# Patient Record
Sex: Male | Born: 1950
Health system: Southern US, Community
[De-identification: ages and names within clinical notes are randomized; demographics above are authoritative.]

## PROBLEM LIST (undated history)

## (undated) DIAGNOSIS — N401 Enlarged prostate with lower urinary tract symptoms: Secondary | ICD-10-CM

## (undated) DIAGNOSIS — Z8669 Personal history of other diseases of the nervous system and sense organs: Secondary | ICD-10-CM

## (undated) DIAGNOSIS — J45909 Unspecified asthma, uncomplicated: Secondary | ICD-10-CM

## (undated) DIAGNOSIS — K5730 Diverticulosis of large intestine without perforation or abscess without bleeding: Secondary | ICD-10-CM

## (undated) DIAGNOSIS — Z9889 Other specified postprocedural states: Secondary | ICD-10-CM

## (undated) DIAGNOSIS — Z87442 Personal history of urinary calculi: Secondary | ICD-10-CM

## (undated) DIAGNOSIS — E782 Mixed hyperlipidemia: Secondary | ICD-10-CM

## (undated) DIAGNOSIS — Z973 Presence of spectacles and contact lenses: Secondary | ICD-10-CM

## (undated) DIAGNOSIS — R112 Nausea with vomiting, unspecified: Secondary | ICD-10-CM

## (undated) DIAGNOSIS — T4145XA Adverse effect of unspecified anesthetic, initial encounter: Secondary | ICD-10-CM

## (undated) DIAGNOSIS — F329 Major depressive disorder, single episode, unspecified: Secondary | ICD-10-CM

## (undated) DIAGNOSIS — K219 Gastro-esophageal reflux disease without esophagitis: Secondary | ICD-10-CM

## (undated) DIAGNOSIS — K5909 Other constipation: Secondary | ICD-10-CM

## (undated) DIAGNOSIS — F419 Anxiety disorder, unspecified: Secondary | ICD-10-CM

## (undated) DIAGNOSIS — N201 Calculus of ureter: Secondary | ICD-10-CM

## (undated) DIAGNOSIS — K5790 Diverticulosis of intestine, part unspecified, without perforation or abscess without bleeding: Secondary | ICD-10-CM

## (undated) DIAGNOSIS — I10 Essential (primary) hypertension: Secondary | ICD-10-CM

## (undated) DIAGNOSIS — K5900 Constipation, unspecified: Secondary | ICD-10-CM

## (undated) DIAGNOSIS — N200 Calculus of kidney: Secondary | ICD-10-CM

## (undated) DIAGNOSIS — M1990 Unspecified osteoarthritis, unspecified site: Secondary | ICD-10-CM

## (undated) DIAGNOSIS — G5602 Carpal tunnel syndrome, left upper limb: Secondary | ICD-10-CM

## (undated) DIAGNOSIS — E669 Obesity, unspecified: Secondary | ICD-10-CM

## (undated) DIAGNOSIS — N400 Enlarged prostate without lower urinary tract symptoms: Secondary | ICD-10-CM

## (undated) HISTORY — PX: APPENDECTOMY: SHX54

## (undated) HISTORY — DX: Obesity, unspecified: E66.9

## (undated) HISTORY — PX: COLONOSCOPY: SHX174

## (undated) HISTORY — DX: Essential (primary) hypertension: I10

## (undated) HISTORY — DX: Unspecified osteoarthritis, unspecified site: M19.90

## (undated) HISTORY — DX: Gastro-esophageal reflux disease without esophagitis: K21.9

## (undated) HISTORY — DX: Diverticulosis of intestine, part unspecified, without perforation or abscess without bleeding: K57.90

## (undated) HISTORY — PX: HAMMER TOE SURGERY: SHX385

## (undated) HISTORY — PX: TOTAL KNEE ARTHROPLASTY: SHX125

## (undated) HISTORY — DX: Mixed hyperlipidemia: E78.2

## (undated) HISTORY — PX: INGUINAL HERNIA REPAIR: SUR1180

## (undated) HISTORY — DX: Anxiety disorder, unspecified: F41.9

## (undated) HISTORY — PX: SHOULDER ARTHROSCOPY DISTAL CLAVICLE EXCISION AND OPEN ROTATOR CUFF REPAIR: SHX2396

## (undated) HISTORY — PX: HERNIA REPAIR: SHX51

## (undated) HISTORY — DX: Benign prostatic hyperplasia without lower urinary tract symptoms: N40.0

## (undated) HISTORY — PX: LAPAROSCOPIC SIGMOID COLECTOMY: SHX5928

## (undated) HISTORY — DX: Major depressive disorder, single episode, unspecified: F32.9

---

## 2000-07-04 HISTORY — PX: KNEE ARTHROSCOPY: SUR90

## 2000-07-30 ENCOUNTER — Encounter: Payer: Self-pay | Admitting: Orthopedic Surgery

## 2000-07-30 ENCOUNTER — Inpatient Hospital Stay (HOSPITAL_COMMUNITY): Admission: EM | Admit: 2000-07-30 | Discharge: 2000-08-02 | Payer: Self-pay | Admitting: Emergency Medicine

## 2002-07-04 HISTORY — PX: COLECTOMY: SHX59

## 2003-02-12 ENCOUNTER — Inpatient Hospital Stay (HOSPITAL_COMMUNITY): Admission: RE | Admit: 2003-02-12 | Discharge: 2003-02-17 | Payer: Self-pay | Admitting: *Deleted

## 2003-02-12 ENCOUNTER — Encounter (INDEPENDENT_AMBULATORY_CARE_PROVIDER_SITE_OTHER): Payer: Self-pay | Admitting: Specialist

## 2003-09-17 ENCOUNTER — Ambulatory Visit (HOSPITAL_BASED_OUTPATIENT_CLINIC_OR_DEPARTMENT_OTHER): Admission: RE | Admit: 2003-09-17 | Discharge: 2003-09-17 | Payer: Self-pay | Admitting: *Deleted

## 2003-09-17 ENCOUNTER — Encounter (INDEPENDENT_AMBULATORY_CARE_PROVIDER_SITE_OTHER): Payer: Self-pay | Admitting: *Deleted

## 2003-09-17 ENCOUNTER — Ambulatory Visit (HOSPITAL_COMMUNITY): Admission: RE | Admit: 2003-09-17 | Discharge: 2003-09-17 | Payer: Self-pay | Admitting: *Deleted

## 2006-07-04 HISTORY — PX: SHOULDER SURGERY: SHX246

## 2008-01-17 ENCOUNTER — Ambulatory Visit (HOSPITAL_BASED_OUTPATIENT_CLINIC_OR_DEPARTMENT_OTHER): Admission: RE | Admit: 2008-01-17 | Discharge: 2008-01-17 | Payer: Self-pay | Admitting: Orthopedic Surgery

## 2009-07-04 HISTORY — PX: TOTAL KNEE ARTHROPLASTY: SHX125

## 2009-12-16 ENCOUNTER — Inpatient Hospital Stay (HOSPITAL_COMMUNITY): Admission: RE | Admit: 2009-12-16 | Discharge: 2009-12-18 | Payer: Self-pay | Admitting: Orthopedic Surgery

## 2010-07-10 ENCOUNTER — Encounter: Admission: RE | Admit: 2010-07-10 | Discharge: 2010-07-10 | Payer: Self-pay | Source: Home / Self Care

## 2010-09-19 LAB — BASIC METABOLIC PANEL
BUN: 12 mg/dL (ref 6–23)
CO2: 28 mEq/L (ref 19–32)
Calcium: 8.8 mg/dL (ref 8.4–10.5)
GFR calc Af Amer: >60 mL/min (ref >60)
GFR calc Af Amer: >60 mL/min (ref >60)
GFR calc non Af Amer: >60 mL/min (ref >60)
GFR calc non Af Amer: >60 mL/min (ref >60)
Potassium: 3.9 mEq/L (ref 3.5–5.1)
Sodium: 133 mEq/L — ABNORMAL LOW (ref 135–145)
Sodium: 135 mEq/L (ref 135–145)

## 2010-09-19 LAB — PROTIME-INR
INR: 1.29 (ref 0.00–1.49)
Prothrombin Time: 14 seconds (ref 11.6–15.2)

## 2010-09-19 LAB — CBC
HCT: 34.9 % — ABNORMAL LOW (ref 39.0–52.0)
Hemoglobin: 11.5 g/dL — ABNORMAL LOW (ref 13.0–17.0)
Platelets: 200 10*3/uL (ref 150–400)
RBC: 3.56 MIL/uL — ABNORMAL LOW (ref 4.22–5.81)
RBC: 3.79 MIL/uL — ABNORMAL LOW (ref 4.22–5.81)
WBC: 11 10*3/uL — ABNORMAL HIGH (ref 4.0–10.5)

## 2010-09-20 LAB — URINALYSIS, ROUTINE W REFLEX MICROSCOPIC
Bilirubin Urine: NEGATIVE
Protein, ur: NEGATIVE mg/dL
Specific Gravity, Urine: 1.014 (ref 1.005–1.030)
Urobilinogen, UA: 0.2 mg/dL (ref 0.0–1.0)

## 2010-09-20 LAB — PROTIME-INR
INR: 0.94 (ref 0.00–1.49)
Prothrombin Time: 12.5 seconds (ref 11.6–15.2)

## 2010-09-20 LAB — CBC
HCT: 42.2 % (ref 39.0–52.0)
Hemoglobin: 14.3 g/dL (ref 13.0–17.0)
WBC: 8.3 10*3/uL (ref 4.0–10.5)

## 2010-09-20 LAB — TYPE AND SCREEN: Antibody Screen: NEGATIVE

## 2010-09-20 LAB — COMPREHENSIVE METABOLIC PANEL
Alkaline Phosphatase: 64 U/L (ref 39–117)
BUN: 15 mg/dL (ref 6–23)
Chloride: 105 mEq/L (ref 96–112)
Glucose, Bld: 110 mg/dL — ABNORMAL HIGH (ref 70–99)
Potassium: 4.1 mEq/L (ref 3.5–5.1)
Total Bilirubin: 0.5 mg/dL (ref 0.3–1.2)

## 2010-09-20 LAB — URINE MICROSCOPIC-ADD ON

## 2010-11-16 NOTE — Op Note (Signed)
NAME:  Tony Douglas, Tony Douglas NO.:  [id number]   MEDICAL RECORD NO.:  [id number]          PATIENT TYPE:  AMB   LOCATION:  DSC                          FACILITY:  [facility]   PHYSICIAN:  Loreta Ave, M.D. DATE OF BIRTH:  Apr 10, 1951   DATE OF PROCEDURE:  01/17/2008  DATE OF DISCHARGE:                               OPERATIVE REPORT   PREOPERATIVE DIAGNOSES:  1. Right shoulder impingement.  2. Distal clavicle degenerative joint disease.  3. Extensive tearing supra and infraspinatus tendon with retraction.   POSTOPERATIVE DIAGNOSES:  1. Right shoulder impingement.  2. Distal clavicle degenerative joint disease.  3. Extensive tearing supra and infraspinatus tendon with retraction.  4. Complex labral tearing.   PROCEDURE:  1. Right shoulder exam under anesthesia.  2. Arthroscopy, debridement of labrum and cuff.  3. Acromioplasty with CA ligament release.  4. Excision, distal clavicle.  5. Arthroscopic-assisted rotator cuff repair.  6. Supra and infraspinatus tendon with fiber weave sutures x3 swivel      lock anchors x3.   SURGEON:  Loreta Ave, MD   ASSISTANT:  Genene Churn. Barry Dienes, [place], present throughout the entire case  necessary for timely completion of procedure.   ANESTHESIA:  General.   BLOOD LOSS:  Minimal.   SPECIMENS:  None.   COUNTS:  None.   COMPLICATIONS:  None.   DRESSINGS:  Soft compressive with shoulder immobilizer.   PROCEDURE:  The patient was brought to the operating room and placed on  operating table in supine position.  After adequate anesthesia had been  obtained, shoulder examined.  Full motion and good stability.  Placed in  a beach-chair position on the shoulder positioner, and was prepped and  draped in usual sterile fashion.  Three portals were created; anterior,  posterior, and lateral.  Shoulder entered with blunt obturator.  Arthroscope was introduced, with the shoulder distended and inspected.  Complete tearing supraspinatus  tendon from the biceps in the front all  way into the top half of the infraspinatus and the back with a little  interval tearing between super and infraspinatus of the back.  Debrided  back to healthy tissue.  Although, this was a massive retracted tear, it  was still very mobile and could be brought out laterally fairly easily.  Partial tearing and subscapularis debrided.  Biceps tendon intact.  Complex tearing, labrum debrided.  Grade 2 change in the shoulder.  From  above type 3 acromion debrided and then converted to a type 1 acromion  with shaver high-speed bur releasing a CA ligament off the front to  facilitate this.  Grade 4 changes distal clavicle with marked spurring.  Periarticular spurs and lateral centimeter of clavicle resected with a  shaver and high-speed bur.  Adequacy of decompression confirmed viewing  from all portals.  Lateral portal was opened, little larger and a  cannula was inserted there.  I then captured the cuff with a horizontal  mattress suture through the lateral portal over the supraspinatus tendon  anterior and posterior.  These were firmly anchored down into the  tuberosity which had been  roughened to bleeding bone.  I anchored there  with swivel lock device which gave a nice firm closure and repair.  Slightly medialized.  Once those were in I then captured the back of the  supraspinatus going over the interval tear at the top half of the  infraspinatus with a horizontal mattress sutures well.  This was brought  out laterally and then firmly anchored down into the back of the  tuberosity near the top of the infraspinatus attachment.  That was also  done with a swivel lock device.  All excess sutures were cut.  A  completion and adequacy of repair was confirmed looking from above and  below.  Full passive motion without too much tension on the cuff repair.  All instruments and fluid removed.  Portals injected with Marcaine and  closed with nylon.  Sterile  compressive dressing applied.  Shoulder  mobilizer applied.  Anesthesia reversed.  Brought to the room.  Tolerated surgery well with no complications.      Loreta Ave, M.D.  Electronically Signed     DFM/MEDQ  D:  01/17/2008  T:  01/18/2008  Job:  [id number]

## 2010-11-19 NOTE — Op Note (Signed)
   NAME:  Tony Douglas, Tony Douglas                           ACCOUNT NO.:  [id number]   MEDICAL RECORD NO.:  [id number]                   PATIENT TYPE:  INP   LOCATION:  0366                                 FACILITY:  [facility]   PHYSICIAN:  Vikki Ports, M.D.         DATE OF BIRTH:  [birth year]   DATE OF PROCEDURE:  02/12/2003  DATE OF DISCHARGE:                                 OPERATIVE REPORT   PREOPERATIVE DIAGNOSIS:  Sigmoid diverticulosis.   POSTOPERATIVE DIAGNOSIS:  Sigmoid diverticulosis.   PROCEDURE:  Laparoscopic sigmoid colectomy with mobilization of the splenic  flexure.   SURGEON:  Vikki Ports, M.D.   ASSISTANT:  Sharlet Salina T. Hoxworth, M.D.   ANESTHESIA:  General.   DESCRIPTION OF PROCEDURE:  The patient was taken to the operating room and  placed in the supine position.  After adequate general anesthesia was  induced, the abdomen and perineum were prepped and draped in the normal  sterile fashion.  The patient was placed in the lithotomy position.  Foley  catheter was placed.  A infraumbilical vertical incision was made and Hasson  trocar was placed in the abdomen.  Pneumoperitoneum was obtained with 10 mm  port in the left lower quadrant.  A 5 mm port was placed in the right  abdomen and an additional 5 mm port was in the suprapubic region.  The  sigmoid colon was identified.   Lateral attachments were taken down and following the line of Toldt, the  colon was completely mobilized medially.  It was bluntly dissected off  Gerota's fascia and the splenic flexure was taken down.  This provided  adequate mobilization of the colon.  The diseased portion was dissected  __________ at the rectosigmoid junction at the __________ proximal  __________  fascia there.  The mesentery was buried in fat and I felt that  the shape and __________.   Dictation ended here.                                               Vikki Ports, M.D.    KRH/MEDQ  D:   02/12/2003  T:  02/12/2003  Job:  [id number]

## 2010-11-19 NOTE — Op Note (Signed)
[place]. [facility]  Patient:    Tony Douglas, Tony Douglas                          MRN: [id number] Proc. Date: 07/30/00 Attending:  Elana Alm. Thurston Hole, M.D.                           Operative Report  PREOPERATIVE DIAGNOSIS:   Right septic knee infection.  POSTOPERATIVE DIAGNOSIS:  Right septic knee infection.  OPERATION:  Right knee examination under anesthesia followed by arthroscopic lavage.  SURGEON:  Elana Alm. Thurston Hole, M.D.  ASSISTANT:  Kirstin A. Shepperson, P.A.  ANESTHESIA:  General.  OPERATIVE TIME:  45 minutes.  COMPLICATIONS:  None.  DESCRIPTION OF PROCEDURE:  Mr. Redfield was brought to the operating room on Jul 30, 2000, placed on the table in the supine position.  After an adequate level of general anesthesia was obtained, his right knee was examined under anesthesia.  He had a large effusion with erythema and surrounding cellulitis. Range of motion was from -5 to 125 degrees.  The knee was stable ligamentous exam.  Right leg was prepped using sterile Betadine and draped using sterile technique.  He received Ancef 1 g IV preoperatively.  Initially through the inferior lateral and inferior medial portals, the arthroscope with a pump attached was placed and an arthroscopic probe was placed.  Moderate serous bloody fluid was evacuated from the knee, and this was sent for Gram stain and culture.  On initial inspection, the articular cartilage, medial femoral condyle, medial tibial plateau showed 50-75% grade III chondromalacia which was thoroughly debrided.  The medial meniscus had a remnant remaining of about 30% in the posterior and medial horn which was further debrided.  Moderate purulent material was removed.  Intracondylar notch inspected.  A large amount of synovitis was noted in the anterior compartment of the knee blocking full extension.  This was thoroughly debrided.  Underneath this, the anterior and posterior cruciate ligaments were found to  be intact.  The lateral compartment was inspected.  Articular cartilage, lateral femoral condyle and lateral tibial plateau showed moderate grade II and 20-30% grade III chondromalacia which was debrided.  The lateral meniscus was intact.  The patellofemoral joint showed grade III chondromalacia over 50-75% and grade IV changes over the lateral 20-30%.  This was further debrided.  A previous lateral retinacular release had been carried out by Dr. Eulah Pont two weeks ago, and there was significant hemorrhagic synovitis in the lateral and medial gutters with mild purulent material noted, and this was thoroughly debrided.  After this was done, it was felt that a subtotal synovectomy had been carried out. The knee was thoroughly irrigated with nine liters of saline solution and 500 cc of antibiotic solution.  After this was done, it was felt that all pathology had been satisfactorily addressed.  The instruments were removed. The portals had medium Hemovac drains placed in both of them and then closed with 3-0 nylon suture.  The drains were hooked up to suction.  Sterile dressings were applied.  The tourniquet which had been placed at the beginning of the case had been deflated prior to removing the arthroscopic instruments and the knee had been further lavaged with the tourniquet down.  After this was done, the patient was awakened and taken to the recovery room in stable condition.  Needle and sponge counts were correct x 2 at the end of  the case. DD:  07/30/00 TD:  07/30/00 Job: [id number]

## 2010-11-19 NOTE — Op Note (Signed)
NAME:  Tony Douglas, Tony Douglas                           ACCOUNT NO.:  [id number]   MEDICAL RECORD NO.:  [id number]                   PATIENT TYPE:  AMB   LOCATION:  DSC                                  FACILITY:  [facility]   PHYSICIAN:  Vikki Ports, M.D.         DATE OF BIRTH:  [birth year]   DATE OF PROCEDURE:  09/17/2003  DATE OF DISCHARGE:                                 OPERATIVE REPORT   PREOPERATIVE DIAGNOSIS:  Right inguinal hernia.   POSTOPERATIVE DIAGNOSIS:  Double right inguinal hernia, indirect and direct.   PROCEDURE:  Right inguinal hernia repair with mesh.   SURGEON:  Vikki Ports, M.D.   ANESTHESIA:  General.   DESCRIPTION OF PROCEDURE:  The patient was taken to the operating room and  placed in the supine position.  After adequate general anesthesia was  induced using laryngeal mask, the right groin was prepped and draped in the  normal sterile fashion.  Using an oblique incision over the right inguinal  canal, we dissected down onto the external oblique fascia.  It was opened  along its fibers to the external ring.  Ilioinguinal nerve was preserved  laterally.  Spermatic cord was circled with a Penrose drain at the external  ring.  An indirect hernia sac was identified, dissected free off the  spermatic cord, and ligated at the internal ring.  This sac was removed.  Direct hernia defect could be seen through the floor of Hesselbach's  triangle.  This was reduced manually and a piece of Parietex polyester mesh  measuring 4 x 6 inches was placed over the floor of Hesselbach's triangle  and sewn using a 2-0 Prolene from the pubic tubercle, along the inguinal  ligament, against the transversalis fascia, split, and brought out lateral  to the internal ring.  I was very happy with the repair.  All tissues were  injected with Marcaine.  External oblique fascia was closed with a running 3-  0 Vicryl, skin was closed with staples.  The patient tolerated the procedure  well and went to PACU in good condition.                                               Vikki Ports, M.D.    KRH/MEDQ  D:  09/17/2003  T:  09/18/2003  Job:  [id number]

## 2010-11-19 NOTE — Discharge Summary (Signed)
[place]. [facility]  Patient:    Tony Douglas, Tony Douglas                          MRN: [id number] Adm. Date:  [id number] Disc. Date: [id number] Attending:  Twana First Dictator:   Oris Drone. Petrarca, P.A.-C.                           Discharge Summary  ADMISSION DIAGNOSIS:  Septic arthritis of the right knee.  DISCHARGE DIAGNOSES: 1. Septic arthritis of the right knee. 2. Cellulitis.  PROCEDURES:  Arthroscopic debridement of the right knee.  HISTORY OF PRESENT ILLNESS:  This is a 60 year old white male, status post right knee arthroscopy, which had increasing pain, swelling, and redness of the right knee.  He was started on p.o. Keflex the night prior to his admission.  He was seen at [facility] and was referred here for evaluation.  HOSPITAL COURSE:  A 60 year old white male admitted on July 30, 2000. After appropriate laboratory studies were obtained, he was taken to the operating room where he underwent an arthroscopic lavage of the right knee. He tolerated the procedure well.  He was placed on a CPM machine postoperatively.  Continued on Ancef 1 g IV q.8h.  Consultations with infectious disease were obtained and he was continued on Ancef initially.  It was felt that he would be a candidate for outpatient IV therapy initially. However, after further evaluation by infectious disease, it was felt that he could begin Tequin 400 mg p.o. daily.  The remainder of his hospital course was uneventful.  His wounds remained benign.  He was discharged on August 02, 2000, and will return back to the office in three to five days for re-evaluation.  LABORATORY STUDIES:  EKG showed normal sinus rhythm.  Admitted with a hemoglobin of 11.8, hematocrit 34.4%, white count 10,200, and platelets 461,000.  Discharge hemoglobin 11.4, hematocrit 33.3%, white count 9800, and platelets 505,000.  The differential revealed 75 neutrophils, 12 lymphs, 8 monos,  3 eos, and 1 baso.  The sedimentation rate was 119. Chemistries:  Sodium 136, potassium 3.9, chloride 102, CO2 27, glucose 102, BUN 23, creatinine 0.9, calcium 9.3, total protein 6.7, albumin 2.8, AST 22, ALT 45, ALP 76, total bilirubin 0.6.  Aspirate from the knee showed red, bloody, turbid fluid with 10,500 white cells, 97 neutrophils, 3 lymphs, and no macrophages or eosinophils.  The urine showed 1.0 urobilinogen, otherwise benign.  Synovial fluid culture revealed a rare Staphylococcus coagulase negative which was resistant to Ancef, but was sensitive to clindamycin, erythromycin, gentamicin, levofloxacin, rifampin, tetracycline, Septra, and vancomycin.  It was resistant to penicillin and oxacillin.  A wound culture of July 30, 2000, showed no growth.  Anaerobic cultures showed no growth.  DISCHARGE MEDICATIONS:  Given a prescription for Tequin 400 mg p.o. daily and Percocet 5/325 mg one to two q.4h. p.r.n. pain.  ACTIVITY:  He will be weightbearing as tolerated.  WOUND CARE:  Keep the incision clean and dry.  SPECIAL INSTRUCTIONS:  Take his temperature during the morning and evening and call if greater than 101 degrees.  FOLLOW-UP:  He will follow back up Loreta Ave, M.D., next week for re-evaluation.  Call in the interim if he has problems.  CONDITION ON DISCHARGE:  Discharged in good condition. DD:  08/30/00 TD:  08/31/00 Job: [id number]

## 2010-11-19 NOTE — Op Note (Signed)
NAME:  Tony Douglas, Tony Douglas                           ACCOUNT NO.:  [id number]   MEDICAL RECORD NO.:  [id number]                   PATIENT TYPE:  INP   LOCATION:  0366                                 FACILITY:  [facility]   PHYSICIAN:  Vikki Ports, M.D.         DATE OF BIRTH:  March 25, 1951   DATE OF PROCEDURE:  02/12/2003  DATE OF DISCHARGE:                                 OPERATIVE REPORT   PREOPERATIVE DIAGNOSIS:  Sigmoid diverticulosis.   POSTOPERATIVE DIAGNOSIS:  Sigmoid diverticulosis.   OPERATION/PROCEDURE:  Laparoscopic sigmoid colectomy with mobilization of  splenic flexure.   SURGEON:  Vikki Ports, M.D.   ASSISTANT:  Sharlet Salina T. Hoxworth, M.D.   ANESTHESIA:  General.   DESCRIPTION OF PROCEDURE:  The patient was taken to the operating room and  placed in the supine position after adequate general anesthesia was induced  using endotracheal tube.  The patient was placed in the lithotomy position  and the abdomen and peritoneum were prepped and draped in the in the normal  sterile fashion.  Using an infraumbilical small vertical incision, we  dissected down to the fascia which was opened.  0 Vicryl pursestring suture  was placed and the Hasson trocar was placed in the abdomen.  The  pneumoperitoneum was obtained using continuous flow carbon dioxide to a  measurement of 15 mmHg.  On direct visualization, a 10 mm trocar was placed  in the right upper quadrant.  An additional 5 mm trocar was placed in the  midline in the supraumbilical region and an additional 5 mm trocar was  placed in right abdomen.  Sigmoid colon was identified, retracted medially.  Lateral attachments were taken down with the Harmonic scalpel along the line  of Toldt.  The colon was bluntly dissected off Gerota's fascia.  Splenic  flexure was taken down with the Harmonic scalpel which provided adequate  mobilization of the proximal segment.  The distal diseased segment was  identified as the  rectosigmoid junction.  The colon was divided using a 60  mm blue load [employer] stapling device.  The proximal segment of disease  was identified and the colon was divided in a similar manner using a linear  cutter stapling device.  The mesentery, which was very, very thick, I felt  was not amenable to staple position and, therefore, after identifying the  ureter, the diseased segment was identified again and grasped with a _______  grasper.  The pneumoperitoneum was released, trocars were removed and a  lower vertical midline incision was made by connecting the Hasson trocar  site to the 5 mm trocar site.  Peritoneum was entered and the sigmoid colon  was delivered up and through the wound.  Mesentery was taken down between  [facility] clamps.  Specimen was removed and an end-to-end anastomosis was  performed with interrupted 3-0 silk sutures.  This was under no tension.  It  was clamped proximally.  Rigid sigmoidoscopy was performed and showed good  dilatation and distention of the colon at the anastomosis.  Under saline  irrigation, there was no evidence of leaks.  Anastomosis was patent.  Scope  was removed and colon was decompressed.  The abdomen was again irrigated.  The fascia was closed with a running #1 Novofil.  Skin was closed with  staples.  The patient tolerated the procedure well and went to PACU in good  condition.                                              Vikki Ports, M.D.   KRH/MEDQ  D:  02/12/2003  T:  02/12/2003  Job:  [id number]

## 2010-11-19 NOTE — Discharge Summary (Signed)
   NAME:  Tony Douglas, Tony Douglas                           ACCOUNT NO.:  [id number]   MEDICAL RECORD NO.:  [id number]                   PATIENT TYPE:  INP   LOCATION:  0366                                 FACILITY:  [facility]   PHYSICIAN:  Vikki Ports, M.D.         DATE OF BIRTH:  [birth year]   DATE OF ADMISSION:  02/12/2003  DATE OF DISCHARGE:  02/17/2003                                 DISCHARGE SUMMARY   ADMISSION DIAGNOSES:  Diverticulosis.   DISCHARGE DIAGNOSES:  Diverticulosis.   CONDITION ON DISCHARGE:  Good and improved.   PROCEDURE:  Laparoscopic assisted sigmoid colectomy.   HISTORY OF PRESENT ILLNESS:  The patient is a 60 year old white male who was  evaluated as an outpatient with multiple episodes of diverticulitis.  The  patient now presents for elective sigmoid colectomy.  For full H&P, please  see the chart.   HOSPITAL COURSE:  The patient underwent colon bowel prep and presented and  underwent laparoscopic sigmoid colectomy.  He did well postoperatively.  Had  minimal pain.  He was started on clear sips on postoperative day #2.  His  blood pressure was running in the 140s and he was started back on his  lisinopril and doxazosin.  His blood pressure was well controlled.  He was  feeling better.  On postoperative day #4 he began passing flatus and was  advanced to a regular diet.  By postoperative day #5 he was doing very well  and discharged home.   DISPOSITION:  Discharged to home.   FOLLOWUP:  Follow up with me in two weeks.                                               Vikki Ports, M.D.    KRH/MEDQ  D:  03/18/2003  T:  03/18/2003  Job:  [id number]

## 2011-03-31 LAB — BASIC METABOLIC PANEL
Calcium: 9.7
Creatinine, Ser: 0.93
GFR calc non Af Amer: >60
Glucose, Bld: 177 — ABNORMAL HIGH
Sodium: 137

## 2011-09-28 ENCOUNTER — Other Ambulatory Visit: Payer: Self-pay | Admitting: Dermatology

## 2011-11-11 ENCOUNTER — Encounter: Payer: Self-pay | Admitting: Internal Medicine

## 2011-11-11 ENCOUNTER — Encounter: Payer: Self-pay | Admitting: *Deleted

## 2011-11-14 ENCOUNTER — Ambulatory Visit (INDEPENDENT_AMBULATORY_CARE_PROVIDER_SITE_OTHER): Payer: 59 | Admitting: Internal Medicine

## 2011-11-14 ENCOUNTER — Encounter: Payer: Self-pay | Admitting: Internal Medicine

## 2011-11-14 VITALS — BP 120/82 | HR 95 | Temp 98.1°F | Ht 70.0 in | Wt 224.4 lb

## 2011-11-14 DIAGNOSIS — I10 Essential (primary) hypertension: Secondary | ICD-10-CM

## 2011-11-14 DIAGNOSIS — R059 Cough, unspecified: Secondary | ICD-10-CM | POA: Insufficient documentation

## 2011-11-14 DIAGNOSIS — J449 Chronic obstructive pulmonary disease, unspecified: Secondary | ICD-10-CM

## 2011-11-14 DIAGNOSIS — R05 Cough: Secondary | ICD-10-CM | POA: Insufficient documentation

## 2011-11-14 DIAGNOSIS — J45909 Unspecified asthma, uncomplicated: Secondary | ICD-10-CM | POA: Insufficient documentation

## 2011-11-14 MED ORDER — TRAMADOL HCL 50 MG PO TABS: ORAL_TABLET | ORAL | Status: AC

## 2011-11-14 MED ORDER — OLMESARTAN MEDOXOMIL-HCTZ 20-12.5 MG PO TABS: ORAL_TABLET | ORAL | Status: DC

## 2011-11-14 MED ORDER — OMEPRAZOLE 20 MG PO CPDR: DELAYED_RELEASE_CAPSULE | ORAL | Status: DC

## 2011-11-14 NOTE — Patient Instructions (Signed)
Stop spiriva, stop lotensin  Start Benicar 20/12.5 one half daily  Prilosec 20 mg Take 30- 60 min before your first and last meals of the day until no longer cough  If still cough>  tramdol 50 mg 1-2 every 4 hours as needed  Please schedule a follow up office visit in 6 weeks, call sooner if needed with PFT's

## 2011-11-14 NOTE — Assessment & Plan Note (Signed)
The most common causes of chronic cough in immunocompetent adults include the following: upper airway cough syndrome (UACS), previously referred to as postnasal drip syndrome (PNDS), which is caused by variety of rhinosinus conditions; (2) asthma; (3) GERD; (4) chronic bronchitis from cigarette smoking or other inhaled environmental irritants; (5) nonasthmatic eosinophilic bronchitis; and (6) bronchiectasis.   These conditions, singly or in combination, have accounted for up to 94% of the causes of chronic cough in prospective studies.   Other conditions have constituted no >6% of the causes in prospective studies These have included bronchogenic carcinoma, chronic interstitial pneumonia, sarcoidosis, left ventricular failure, ACEI-induced cough, and aspiration from a condition associated with pharyngeal dysfunction.   This is most likely  Classic Upper airway cough syndrome, so named because it's frequently impossible to sort out how much is  CR/sinusitis with freq throat clearing (which can be related to primary GERD)   vs  causing  secondary (" extra esophageal")  GERD from wide swings in gastric pressure that occur with throat clearing, often  promoting self use of mint and menthol lozenges that reduce the lower esophageal sphincter tone and exacerbate the problem further in a cyclical fashion.   These are the same pts (now being labeled as having "irritable larynx syndrome" by some cough centers) who not infrequently have a history of having failed to tolerate ace inhibitors,  dry powder inhalers or biphosphonates or report having atypical reflux symptoms that don't respond to standard doses of PPI , and are easily confused as having aecopd or asthma flares by even experienced allergists/ pulmonologists.   For now try off acei and then regroup.

## 2011-11-14 NOTE — Assessment & Plan Note (Signed)
  When respiratory symptoms begin or become refractory well after a patient reports complete smoking cessation,  Especially when this wasn't the case while they were smoking, a red flag is raised based on the work of Dr Primitivo Gauze which states:  if you quit smoking when your best day FEV1 is still well preserved it is highly unlikely you will progress to severe disease.  That is to say, once the smoking stops,  the symptoms should not suddenly erupt or markedly worsen.  If so, the differential diagnosis should include  obesity/deconditioning,  LPR/Reflux/Aspiration syndromes,  occult CHF, or  especially side effect of medications commonly used in this population like acei.  Needs trial off acei and f/u pfts

## 2011-11-14 NOTE — Assessment & Plan Note (Signed)

## 2011-11-14 NOTE — Progress Notes (Signed)
  Subjective:    Patient ID: Tony Douglas, male    DOB: 11/15/1950 MRN: [id number]  HPI  51 yowm quit smoking in 1994 no problems at all then referred by [name] 11/14/2011 for  Refractory cough and sob   11/14/2011 1st pulmonary eval cc new onset sob x one year much worse when coughing esp over the last 5 weeks.  Cough min productive of yellow rx with augmentin and levoquin  And  no better. No real variability, no better on daytime saba. Sob not necessarily proproportinate to activity. No better on spiriva  Sleeping ok without nocturnal  or early am exacerbation  of respiratory  c/o's or need for noct saba. Also denies any obvious fluctuation of symptoms with weather or environmental changes or other aggravating or alleviating factors except as outlined above    Review of Systems  Constitutional: Negative for fever, chills, activity change, appetite change and unexpected weight change.  HENT: Negative for congestion, sore throat, rhinorrhea, sneezing, trouble swallowing, dental problem, voice change and postnasal drip.   Eyes: Negative for visual disturbance.  Respiratory: Positive for cough and shortness of breath. Negative for choking.   Cardiovascular: Negative for chest pain and leg swelling.  Gastrointestinal: Negative for nausea, vomiting and abdominal pain.  Genitourinary: Negative for difficulty urinating.  Musculoskeletal: Negative for arthralgias.  Skin: Negative for rash.  Psychiatric/Behavioral: Negative for behavioral problems and confusion.       Objective:   Physical Exam amb obese wm nad  Wt 224 11/14/2011  HEENT mild turbinate edema.  Oropharynx no thrush or excess pnd or cobblestoning.  No JVD or cervical adenopathy. Mild accessory muscle hypertrophy. Trachea midline, nl thryroid. Chest was min hyperinflated by percussion with diminished breath sounds and mild increased exp time without wheeze. Hoover sign positive at end inspiration. Regular rate and rhythm without  murmur gallop or rub or increase P2 or edema.  Abd: no hsm, nl excursion. Ext warm without cyanosis or clubbing.    cxr 10/25/11 wnl        Assessment & Plan:

## 2011-11-22 ENCOUNTER — Telehealth: Payer: Self-pay | Admitting: Internal Medicine

## 2011-11-22 NOTE — Telephone Encounter (Signed)
Called, spoke with pt's spouse, Tyler Aas.  I informed her of below per Dr. Sherene Sires.  We have scheduled pt to see TP on Thursday, May 23 at 12 pm.  I asked to pls have pt bring all medications - scheduled and prn - with him to OV.  Doris verbalized understanding and is aware to call back or have pt seek emergency care if symptoms worsen prior to OV.

## 2011-11-22 NOTE — Telephone Encounter (Signed)
Not clear what's going on - tramadol in high doses can cause nausea and he should be by now - needs ov with all meds in hand to regroup either me or Tam NP

## 2011-11-22 NOTE — Telephone Encounter (Signed)
Per spouse, the pt's cough has gotten worse over the past 8 days. He has been taking 2 of the Tramadol at a time, Prilosec twice a day. Pt is coughing so hard that he is vomiting at times. Pt also c/o nausea at times when not coughing and wonders if this could be coming from the new BP medication. MW, pls advise.No Known Allergies

## 2011-11-24 ENCOUNTER — Ambulatory Visit (INDEPENDENT_AMBULATORY_CARE_PROVIDER_SITE_OTHER): Payer: 59 | Admitting: Adult Health

## 2011-11-24 ENCOUNTER — Encounter: Payer: Self-pay | Admitting: Adult Health

## 2011-11-24 VITALS — BP 126/80 | HR 86 | Temp 97.2°F | Ht 70.0 in | Wt 219.0 lb

## 2011-11-24 DIAGNOSIS — J449 Chronic obstructive pulmonary disease, unspecified: Secondary | ICD-10-CM

## 2011-11-24 MED ORDER — LEVALBUTEROL HCL 0.63 MG/3ML IN NEBU
0.6300 mg | INHALATION_SOLUTION | Freq: Once | RESPIRATORY_TRACT | Status: AC
  Administered 2011-11-24: 0.63 mg via RESPIRATORY_TRACT

## 2011-11-24 MED ORDER — PREDNISONE 10 MG PO TABS: ORAL_TABLET | ORAL | Status: DC

## 2011-11-24 MED ORDER — AMOXICILLIN-POT CLAVULANATE 875-125 MG PO TABS: 1.0000 | ORAL_TABLET | Freq: Two times a day (BID) | ORAL | Status: AC

## 2011-11-24 NOTE — Progress Notes (Signed)
  Subjective:    Patient ID: Tony Douglas, male    DOB: 04/29/1951 MRN: [id number]  HPI  79 yowm quit smoking in 1994 no problems at all then referred by [name] 11/14/2011 for  Refractory cough and sob   11/14/2011 1st pulmonary eval cc new onset sob x one year much worse when coughing esp over the last 5 weeks.  Cough min productive of yellow rx with augmentin and levoquin  And  no better. No real variability, no better on daytime saba. Sob not necessarily proproportinate to activity. No better on spiriva >>  11/24/2011 Acute OV  Complains of productive cough with yellow mucus, tightness in chest, wheezing, increased SOB for 1 week.   Initially had GI virus w/ n/v and fever that resolved after 2 days. Also had cough and congestion.  Still has cough and congestion. Wheezing is no better.  No hemoptysis or chest pain.     Review of Systems  Constitutional:   No  weight loss, night sweats,  Fevers, chills,  +fatigue, or  lassitude.  HEENT:   No headaches,  Difficulty swallowing,  Tooth/dental problems, or  Sore throat,                No sneezing, itching, ear ache,  +nasal congestion, post nasal drip,   CV:  No chest pain,  Orthopnea, PND, swelling in lower extremities, anasarca, dizziness, palpitations, syncope.   GI  No heartburn, indigestion, abdominal pain, nausea, vomiting, diarrhea, change in bowel habits, loss of appetite, bloody stools.   Resp:   No coughing up of blood.    No chest wall deformity  Skin: no rash or lesions.  GU: no dysuria, change in color of urine, no urgency or frequency.  No flank pain, no hematuria   MS:  No joint pain or swelling.  No decreased range of motion.  No back pain.  Psych:  No change in mood or affect. No depression or anxiety.  No memory loss.          Objective:   Physical Exam amb obese wm nad  Wt 224 11/14/2011 >219 11/24/2011    GEN: A/Ox3; pleasant , NAD, well nourished   HEENT:  [place]/AT,  EACs-clear, TMs-wnl,  NOSE-clear, THROAT-clear, no lesions, no postnasal drip or exudate noted.   NECK:  Supple w/ fair ROM; no JVD; normal carotid impulses w/o bruits; no thyromegaly or nodules palpated; no lymphadenopathy.  RESP  Coarse BS w/ scattered exp wheezes .no accessory muscle use, no dullness to percussion  CARD:  RRR, no m/r/g  , no peripheral edema, pulses intact, no cyanosis or clubbing.  GI:   Soft & nt; nml bowel sounds; no organomegaly or masses detected.  Musco: Warm bil, no deformities or joint swelling noted.   Neuro: alert, no focal deficits noted.    Skin: Warm, no lesions or rashes      cxr 10/25/11 wnl        Assessment & Plan:

## 2011-11-24 NOTE — Assessment & Plan Note (Signed)
Exacerbation   Plan:  Augmentin 875 mg twice daily, take with food. Mucinex DM twice daily as needed for cough and congestion. Fluids and rest. Prednisone taper. Over the next week. Please contact office for sooner follow up if symptoms do not improve or worsen or seek emergency care  follow up Dr. Sherene Sires  As planned and As needed

## 2011-11-24 NOTE — Patient Instructions (Signed)
Augmentin 875 mg twice daily, take with food. Mucinex DM twice daily as needed for cough and congestion. Fluids and rest. Prednisone taper. Over the next week. Please contact office for sooner follow up if symptoms do not improve or worsen or seek emergency care  follow up Dr. Sherene Sires  As planned and As needed

## 2011-11-24 NOTE — Progress Notes (Signed)
Addended by: Boone Master E on: 11/24/2011 03:07 PM   Modules accepted: Orders

## 2011-12-29 ENCOUNTER — Telehealth: Payer: Self-pay | Admitting: Internal Medicine

## 2011-12-29 NOTE — Telephone Encounter (Signed)
Called and spoke with [facility] and she is aware of 2 samples left up front for them to pick up.

## 2012-01-02 ENCOUNTER — Encounter: Payer: Self-pay | Admitting: Internal Medicine

## 2012-01-02 ENCOUNTER — Ambulatory Visit (INDEPENDENT_AMBULATORY_CARE_PROVIDER_SITE_OTHER): Payer: 59 | Admitting: Internal Medicine

## 2012-01-02 ENCOUNTER — Telehealth: Payer: Self-pay | Admitting: Internal Medicine

## 2012-01-02 VITALS — BP 118/80 | HR 110 | Temp 98.4°F | Ht 70.0 in | Wt 220.0 lb

## 2012-01-02 DIAGNOSIS — I10 Essential (primary) hypertension: Secondary | ICD-10-CM

## 2012-01-02 DIAGNOSIS — R059 Cough, unspecified: Secondary | ICD-10-CM

## 2012-01-02 DIAGNOSIS — R05 Cough: Secondary | ICD-10-CM

## 2012-01-02 DIAGNOSIS — J45909 Unspecified asthma, uncomplicated: Secondary | ICD-10-CM

## 2012-01-02 LAB — PULMONARY FUNCTION TEST

## 2012-01-02 MED ORDER — FAMOTIDINE 20 MG PO TABS: ORAL_TABLET | ORAL | Status: DC

## 2012-01-02 MED ORDER — OLMESARTAN MEDOXOMIL-HCTZ 20-12.5 MG PO TABS: 30.0000 | ORAL_TABLET | Freq: Every day | ORAL | Status: DC

## 2012-01-02 MED ORDER — PREDNISONE (PAK) 10 MG PO TABS: ORAL_TABLET | ORAL | Status: AC

## 2012-01-02 MED ORDER — MONTELUKAST SODIUM 10 MG PO TABS: 10.0000 mg | ORAL_TABLET | Freq: Every day | ORAL | Status: DC

## 2012-01-02 NOTE — Assessment & Plan Note (Addendum)
-   PFT's 01/02/2012  wnl p saba 2 h before study  Clearly this is asthma, not copd, with nl pft's p saba. He also has prominent rhinitis symptoms and has never tried singulair so worthwhile starting trial  Also added h2 hs to see if am cough is related to noct gerd.

## 2012-01-02 NOTE — Assessment & Plan Note (Signed)
Adequate control on present rx, reviewed options but based on response to date would avoid acei indefinitely

## 2012-01-02 NOTE — Progress Notes (Signed)
  Subjective:    Patient ID: Tony Douglas, male    DOB: 02/08/1951 MRN: [id number]  HPI  61 yowm quit smoking in 1994 no problems at all then referred by [name] 11/14/2011 for  Refractory cough and sob   11/14/2011 1st pulmonary eval cc new onset sob x one year much worse when coughing esp over the last 5 weeks.  Cough min productive of yellow rx with augmentin and levoquin  And  no better. No real variability, no better on daytime saba. Sob not necessarily proproportinate to activity. No better on spiriva >> Stop spiriva, stop lotensin Start Benicar 20/12.5 one half daily Prilosec 20 mg Take 30- 60 min before your first and last meals of the day until no longer cough If still cough>  tramdol 50 mg 1-2 every 4 hours as needed  11/24/2011 Acute OV  Complains of productive cough with yellow mucus, tightness in chest, wheezing, increased SOB for 1 week.   Initially had GI virus w/ n/v and fever that resolved after 2 days. Also had cough and congestion.  Still has cough and congestion. Wheezing is no better.  rec Augmentin 875 mg twice daily, take with food. Mucinex DM twice daily as needed for cough and congestion. Fluids and rest. Prednisone taper. Over the next week > 100% better and no need for any inhalers and stayed on prilosec daily and no hb but continued to need daily decong/ antihistamine   01/02/2012 f/u ov/Semya Klinke cc much better transiently then worse x 10 days with cough and subjective wheeze esp at hs and started using saba again. pft's nl on day of ov p using am saba 2 h before pft's.   No   purulent sputum or sinus/hb symptoms on present rx.  Sleeping ok without nocturnal  or early am exacerbation  of respiratory  c/o's or need for noct saba. Also denies any obvious fluctuation of symptoms with weather or environmental changes or other aggravating or alleviating factors except as outlined above   ROS  The following are not active complaints unless bolded sore throat, dysphagia,  dental problems, itching, sneezing,  nasal congestion or excess/ purulent secretions, ear ache,   fever, chills, sweats, unintended wt loss, pleuritic or exertional cp, hemoptysis,  orthopnea pnd or leg swelling, presyncope, palpitations, heartburn, abdominal pain, anorexia, nausea, vomiting, diarrhea  or change in bowel or urinary habits, change in stools or urine, dysuria,hematuria,  rash, arthralgias, visual complaints, headache, numbness weakness or ataxia or problems with walking or coordination,  change in mood/affect or memory.           Objective:   Physical Exam  amb obese wm nad   Wt 224 11/14/2011 >219 11/24/2011 > 01/02/2012  220   GEN: A/Ox3; pleasant , NAD, well nourished   HEENT:  [place]/AT,  EACs-clear, TMs-wnl, NOSE-clear, THROAT-clear, no lesions, no postnasal drip or exudate noted.   NECK:  Supple w/ fair ROM; no JVD; normal carotid impulses w/o bruits; no thyromegaly or nodules palpated; no lymphadenopathy.  RESP  Nl  BS s  wheezes .no accessory muscle use, no dullness to percussion  CARD:  RRR, no m/r/g  , no peripheral edema, pulses intact, no cyanosis or clubbing.  GI:   Soft & nt; nml bowel sounds; no organomegaly or masses detected.  Musco: Warm bil, no deformities or joint swelling noted.         cxr 10/25/11 wnl        Assessment & Plan:

## 2012-01-02 NOTE — Telephone Encounter (Signed)
Rx was sent incorrect. Should be for benicar 20/12.5 1 qd # 30 with 11 refills. Spoke with [place] and clarified this and she verbalized understanding and states nothing further needed.

## 2012-01-02 NOTE — Progress Notes (Signed)
PFT done today. 

## 2012-01-02 NOTE — Patient Instructions (Addendum)
Singulair 10 mg one daily (monalukast) taken in evening should help both nose and and wheezing x one month trial  Pepcid 20 mg (over the counter) one at bedtime until return  Prednisone 10 mg take  4 each am x 2 days,   2 each am x 2 days,  1 each am x2days and stop   Please schedule a follow up office visit in 6 weeks, call sooner if needed with all active medications.

## 2012-02-13 ENCOUNTER — Encounter: Payer: Self-pay | Admitting: Internal Medicine

## 2012-02-13 ENCOUNTER — Ambulatory Visit (INDEPENDENT_AMBULATORY_CARE_PROVIDER_SITE_OTHER): Payer: 59 | Admitting: Internal Medicine

## 2012-02-13 VITALS — BP 104/70 | HR 111 | Temp 98.4°F | Ht 70.0 in | Wt 224.4 lb

## 2012-02-13 DIAGNOSIS — J45909 Unspecified asthma, uncomplicated: Secondary | ICD-10-CM

## 2012-02-13 DIAGNOSIS — I10 Essential (primary) hypertension: Secondary | ICD-10-CM

## 2012-02-13 DIAGNOSIS — R05 Cough: Secondary | ICD-10-CM

## 2012-02-13 NOTE — Assessment & Plan Note (Signed)
Try off acei 11/14/2011 due to cough > resolved 100% 02/13/2012   ACE inhibitors are problematic in  pts with airway complaints because  even experienced pulmonologists can't always distinguish ace effects from copd/asthma/pnds/ allergies etc.  By themselves they don't actually cause a problem, much like oxygen can't by itself start a fire, but they certainly serve as a powerful catalyst or enhancer for any "fire"  or inflammatory process in the upper airway, be it caused by an ET  tube or more commonly reflux (especially in the obese or pts with known GERD or who are on biphoshonates) or URI's, due to interference with bradykinin clearance.  The effects of acei on bradykinin levels occurs in 100% of pt's on acei (unless they surreptitiously stop the med!) but the classic cough is only reported in 5%.  This leaves 95% of pts on acei's  with a variety of syndromes including no identifiable symptom in most  vs non-specific symptoms that wax and wane depending on what other insult is occuring at the level of the upper airway (in his case post nasal drainage and gerd).  Because he has ongoing issues that might trigger upper airway bradykinin production I would leave the acei off indefinitely.

## 2012-02-13 NOTE — Patient Instructions (Addendum)
The major driving force for the cough was likely the ace inhibitor (blood pressure pill)  Your blood pressure is over controlled on benicar so ok to take one half daily  Only use your albuterol as a rescue medication to be used if you can't catch your breath- if you need it more than twice weekly we need to see you back here right away   After labor day ok to try to leave off the night time pepcid but at the first sign of a cough start back   If you are satisfied with your treatment plan let your doctor know and he/she can either refill your medications or you can return here when your prescription runs out.     If in any way you are not 100% satisfied,  please tell us.  If 100% better, tell your friends!

## 2012-02-13 NOTE — Assessment & Plan Note (Signed)
-   PFT's 01/02/2012  wnl p saba 2 h before study    - Singulair rx started 01/02/2012 >  All goals of chronic asthma control met including optimal function and elimination of symptoms with minimal need for rescue therapy.  Contingencies discussed in full including contacting this office immediately if not controlling the symptoms using the rule of two's.

## 2012-02-13 NOTE — Progress Notes (Signed)
Subjective:    Patient ID: Tony Douglas, male    DOB: 08-27-1950   MRN: [id number]  HPI  50 yowm quit smoking in 1994 no problems at all then referred by Tony Douglas 11/14/2011 for  Refractory cough and sob   11/14/2011 1st pulmonary eval cc new onset sob x one year much worse when coughing esp over the last 5 weeks.  Cough min productive of yellow rx with augmentin and levoquin  And  no better. No real variability, no better on daytime saba. Sob not necessarily proproportinate to activity. No better on spiriva >> Stop spiriva, stop lotensin Start Benicar 20/12.5 one half daily Prilosec 20 mg Take 30- 60 min before your first and last meals of the day until no longer cough If still cough>  tramdol 50 mg 1-2 every 4 hours as needed  11/24/2011 Acute OV  Complains of productive cough with yellow mucus, tightness in chest, wheezing, increased SOB for 1 week.   Initially had GI virus w/ n/v and fever that resolved after 2 days. Also had cough and congestion.  Still has cough and congestion. Wheezing is no better.  rec Augmentin 875 mg twice daily, take with food. Mucinex DM twice daily as needed for cough and congestion. Fluids and rest. Prednisone taper. Over the next week > 100% better and no need for any inhalers and stayed on prilosec daily and no hb but continued to need daily decong/ antihistamine   01/02/2012 f/u ov/Tony Douglas cc much better transiently then worse x 10 days with cough and subjective wheeze esp at hs and started using saba again. pft's nl on day of ov p using am saba 2 h before pft's.   No   purulent sputum or sinus/hb symptoms on present rx. rec Singulair 10 mg one daily (monalukast) taken in evening should help both nose and and wheezing x one month trial Pepcid 20 mg (over the counter) one at bedtime until return Prednisone 10 mg take  4 each am x 2 days,   2 each am x 2 days,  1 each am x2days and stop  Please schedule a follow up office visit in 6 weeks, call sooner if  needed with all active medications.   02/13/2012 f/u ov/Tony Douglas did not bring meds but  cc better to his satisfaction s need for cough meds or daytime saba, not limited by coughing. No limiting sob or variability to c/o persistent mild pnds, no purulent nasal secretions  Sleeping ok without nocturnal  or early am exacerbation  of respiratory  c/o's or need for noct saba. Also denies any obvious fluctuation of symptoms with weather or environmental changes or other aggravating or alleviating factors except as outlined above   ROS  The following are not active complaints unless bolded sore throat, dysphagia, dental problems, itching, sneezing,  nasal congestion or excess/ purulent secretions, ear ache,   fever, chills, sweats, unintended wt loss, pleuritic or exertional cp, hemoptysis,  orthopnea pnd or leg swelling, presyncope, palpitations, heartburn, abdominal pain, anorexia, nausea, vomiting, diarrhea  or change in bowel or urinary habits, change in stools or urine, dysuria,hematuria,  rash, arthralgias, visual complaints, headache, numbness weakness or ataxia or problems with walking or coordination,  change in mood/affect or memory.           Objective:   Physical Exam  amb obese wm nad   Wt 224 11/14/2011 >219 11/24/2011 > 01/02/2012  220 > 02/13/2012  224   GEN: A/Ox3; pleasant , NAD, well  nourished   HEENT:  [place]/AT,  EACs-clear, TMs-wnl, NOSE-clear, THROAT-clear, no lesions, no postnasal drip or exudate noted.   NECK:  Supple w/ fair ROM; no JVD; normal carotid impulses w/o bruits; no thyromegaly or nodules palpated; no lymphadenopathy.  RESP  Nl  BS s  wheezes .no accessory muscle use, no dullness to percussion  CARD:  RRR, no m/r/g  , no peripheral edema, pulses intact, no cyanosis or clubbing.  GI:   Soft & nt; nml bowel sounds; no organomegaly or masses detected.  Musco: Warm bil, no deformities or joint swelling noted.         cxr 10/25/11 wnl        Assessment & Plan:

## 2012-02-13 NOTE — Assessment & Plan Note (Signed)
.  Chronic cough is often simultaneously caused by more than one condition. A single cause has been found from 38 to 82% of the time, multiple causes from 18 to 62%. Multiply caused cough has been the result of three diseases up to 42% of the time.     Not clear what the source of the cough was but he's so much better I'm inclined to continue the rx for gerd and the singulair for now and rec he leave off acei indefinitely.

## 2012-04-26 ENCOUNTER — Ambulatory Visit: Payer: 59 | Admitting: Internal Medicine

## 2012-04-26 ENCOUNTER — Encounter: Payer: Self-pay | Admitting: Adult Health

## 2012-04-26 ENCOUNTER — Ambulatory Visit (INDEPENDENT_AMBULATORY_CARE_PROVIDER_SITE_OTHER): Payer: 59 | Admitting: Adult Health

## 2012-04-26 VITALS — BP 100/52 | HR 109 | Temp 96.9°F | Ht 70.0 in | Wt 224.0 lb

## 2012-04-26 DIAGNOSIS — J45909 Unspecified asthma, uncomplicated: Secondary | ICD-10-CM

## 2012-04-26 MED ORDER — HYDROCODONE-HOMATROPINE 5-1.5 MG/5ML PO SYRP: 5.0000 mL | ORAL_SOLUTION | Freq: Four times a day (QID) | ORAL | Status: AC | PRN

## 2012-04-26 MED ORDER — LEVALBUTEROL HCL 0.63 MG/3ML IN NEBU
0.6300 mg | INHALATION_SOLUTION | Freq: Once | RESPIRATORY_TRACT | Status: AC
  Administered 2012-04-26: 0.63 mg via RESPIRATORY_TRACT

## 2012-04-26 MED ORDER — PREDNISONE 10 MG PO TABS: ORAL_TABLET | ORAL | Status: DC

## 2012-04-26 MED ORDER — CEFDINIR 300 MG PO CAPS: 300.0000 mg | ORAL_CAPSULE | Freq: Two times a day (BID) | ORAL | Status: DC

## 2012-04-26 NOTE — Progress Notes (Signed)
Subjective:    Patient ID: Tony Douglas, male    DOB: 07/20/1950   MRN: [id number] HPI  28 yowm quit smoking in 1994 no problems at all then referred by [name] 11/14/2011 for  Refractory cough and sob   11/14/2011 1st pulmonary eval cc new onset sob x one year much worse when coughing esp over the last 5 weeks.  Cough min productive of yellow rx with augmentin and levoquin  And  no better. No real variability, no better on daytime saba. Sob not necessarily proproportinate to activity. No better on spiriva >> Stop spiriva, stop lotensin Start Benicar 20/12.5 one half daily Prilosec 20 mg Take 30- 60 min before your first and last meals of the day until no longer cough If still cough>  tramdol 50 mg 1-2 every 4 hours as needed  11/24/2011 Acute OV  Complains of productive cough with yellow mucus, tightness in chest, wheezing, increased SOB for 1 week.   Initially had GI virus w/ n/v and fever that resolved after 2 days. Also had cough and congestion.  Still has cough and congestion. Wheezing is no better.  rec Augmentin 875 mg twice daily, take with food. Mucinex DM twice daily as needed for cough and congestion. Fluids and rest. Prednisone taper. Over the next week > 100% better and no need for any inhalers and stayed on prilosec daily and no hb but continued to need daily decong/ antihistamine   01/02/2012 f/u ov/Wert cc much better transiently then worse x 10 days with cough and subjective wheeze esp at hs and started using saba again. pft's nl on day of ov p using am saba 2 h before pft's.   No   purulent sputum or sinus/hb symptoms on present rx. rec Singulair 10 mg one daily (monalukast) taken in evening should help both nose and and wheezing x one month trial Pepcid 20 mg (over the counter) one at bedtime until return Prednisone 10 mg take  4 each am x 2 days,   2 each am x 2 days,  1 each am x2days and stop  Please schedule a follow up office visit in 6 weeks, call sooner if  needed with all active medications.   02/13/2012 f/u ov/Wert did not bring meds but  cc better to his satisfaction s need for cough meds or daytime saba, not limited by coughing. No limiting sob or variability to c/o persistent mild pnds, no purulent nasal secretions >>decreased benicar 20/12.5  04/26/2012 Acute OV  Complains of prod cough with yellow mucus, wheezing, increased SOB, tightness/burning in chest  x2 weeks.  Mucinex is not helping- unable to tolerate-makes him feel bad.  Was doing very good until 2 weeks ago. Cough had resolved and felt normal until 2 weeks ago.  No chest pain, no fever.  +DOE, wears out easily, increased SABA use.  Cough is very bad. 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  No hemoptysis or edema.     ROS Constitutional:   No  weight loss, night sweats,  Fevers, chills,  +fatigue, or  lassitude.  HEENT:   No headaches,  Difficulty swallowing,  Tooth/dental problems, or  Sore throat,                No sneezing, itching, ear ache,  +nasal congestion, post nasal drip,   CV:  No chest pain,  Orthopnea, PND, swelling in lower extremities, anasarca, dizziness, palpitations, syncope.   GI  No heartburn, indigestion, abdominal pain, nausea, vomiting, diarrhea, change in bowel habits, loss of appetite, bloody stools.   Resp:  No coughing up of blood.    No chest wall deformity  Skin: no rash or lesions.  GU: no dysuria, change in color of urine, no urgency or frequency.  No flank pain, no hematuria   MS:  No joint pain or swelling.  No decreased range of motion.  No back pain.  Psych:  No change in mood or affect. No depression or anxiety.  No memory loss.            Objective:   Physical Exam  amb obese wm nad   Wt 224 11/14/2011 >219 11/24/2011 > 01/02/2012  220 > 02/13/2012  224>224 04/26/2012    GEN: A/Ox3; pleasant , NAD  HEENT:  [place]/AT,  EACs-clear, TMs-wnl, NOSE-clear, THROAT-clear, no lesions, no postnasal  drip or exudate noted.   NECK:  Supple w/ fair ROM; no JVD; normal carotid impulses w/o bruits; no thyromegaly or nodules palpated; no lymphadenopathy.  RESP  Nl  BS w/ exp  wheezes .no accessory muscle use, no dullness to percussion  CARD:  RRR, no m/r/g  , no peripheral edema, pulses intact, no cyanosis or clubbing.  GI:   Soft & nt; nml bowel sounds; no organomegaly or masses detected.  Musco: Warm bil, no deformities or joint swelling noted.         cxr 10/25/11 wnl        Assessment & Plan:

## 2012-04-26 NOTE — Assessment & Plan Note (Signed)
Exacerbation with bronchitis   xopenex neb in office   Plan Omnicef 300 mg twice daily, take with food Claritin 10 mg daily as needed. For drainage Saline nasal rinses as needed Prednisone taper. Over the next week. Take with food Hydromet 1-2 teaspoons every 4-6 hours as needed, this causes sleepiness. Did not use, while driving  Please contact office for sooner follow up if symptoms do not improve or worsen or seek emergency care  follow up Dr. Sherene Sires  In 6 weeks and As needed

## 2012-04-26 NOTE — Patient Instructions (Addendum)
Omnicef 300 mg twice daily, take with food Claritin 10 mg daily as needed. For drainage Saline nasal rinses as needed Prednisone taper. Over the next week. Take with food Hydromet 1-2 teaspoons every 4-6 hours as needed, this causes sleepiness. Did not use, while driving  Please contact office for sooner follow up if symptoms do not improve or worsen or seek emergency care  follow up Dr. Sherene Sires  In 6 weeks and As needed

## 2012-04-26 NOTE — Addendum Note (Signed)
Addended by: Boone Master E on: 04/26/2012 11:01 AM   Modules accepted: Orders

## 2012-05-14 ENCOUNTER — Ambulatory Visit (INDEPENDENT_AMBULATORY_CARE_PROVIDER_SITE_OTHER): Payer: 59 | Admitting: Adult Health

## 2012-05-14 ENCOUNTER — Encounter: Payer: Self-pay | Admitting: Adult Health

## 2012-05-14 ENCOUNTER — Ambulatory Visit (INDEPENDENT_AMBULATORY_CARE_PROVIDER_SITE_OTHER)
Admission: RE | Admit: 2012-05-14 | Discharge: 2012-05-14 | Disposition: A | Discharge status: Home / Self Care | Payer: 59 | Source: Ambulatory Visit | Attending: Adult Health | Admitting: Adult Health

## 2012-05-14 VITALS — BP 125/74 | HR 84 | Temp 99.7°F | Ht 70.0 in | Wt 224.6 lb

## 2012-05-14 DIAGNOSIS — J45909 Unspecified asthma, uncomplicated: Secondary | ICD-10-CM

## 2012-05-14 DIAGNOSIS — Z23 Encounter for immunization: Secondary | ICD-10-CM

## 2012-05-14 DIAGNOSIS — R05 Cough: Secondary | ICD-10-CM

## 2012-05-14 MED ORDER — LEVALBUTEROL HCL 0.63 MG/3ML IN NEBU
0.6300 mg | INHALATION_SOLUTION | Freq: Once | RESPIRATORY_TRACT | Status: AC
  Administered 2012-05-14: 0.63 mg via RESPIRATORY_TRACT

## 2012-05-14 MED ORDER — AMOXICILLIN-POT CLAVULANATE 875-125 MG PO TABS: 1.0000 | ORAL_TABLET | Freq: Two times a day (BID) | ORAL | Status: AC

## 2012-05-14 MED ORDER — PREDNISONE 10 MG PO TABS: ORAL_TABLET | ORAL | Status: DC

## 2012-05-14 NOTE — Patient Instructions (Addendum)
Augmentin 875mg  Twice daily  For 10 days , take w/ food.  Mucinex DM Twice daily  As needed  Cough/congestion   Saline nasal rinses as needed Prednisone taper. Over the next week. Take with food Begin Symbicort 80/4.66mcg 2 puffs Twice daily  -brush/rinse/gargle after use.  Please contact office for sooner follow up if symptoms do not improve or worsen or seek emergency care  follow up Dr. Sherene Sires  In 6 weeks and As needed

## 2012-05-14 NOTE — Progress Notes (Signed)
Subjective:    Patient ID: Tony Douglas, male    DOB: [birth year]   MRN: [id number] HPI  44 yowm quit smoking in 1994 no problems at all then referred by [name] 11/14/2011 for  Refractory cough and sob   11/14/2011 1st pulmonary eval cc new onset sob x one year much worse when coughing esp over the last 5 weeks.  Cough min productive of yellow rx with augmentin and levoquin  And  no better. No real variability, no better on daytime saba. Sob not necessarily proproportinate to activity. No better on spiriva >> Stop spiriva, stop lotensin Start Benicar 20/12.5 one half daily Prilosec 20 mg Take 30- 60 min before your first and last meals of the day until no longer cough If still cough>  tramdol 50 mg 1-2 every 4 hours as needed  11/24/2011 Acute OV  Complains of productive cough with yellow mucus, tightness in chest, wheezing, increased SOB for 1 week.   Initially had GI virus w/ n/v and fever that resolved after 2 days. Also had cough and congestion.  Still has cough and congestion. Wheezing is no better.  rec Augmentin 875 mg twice daily, take with food. Mucinex DM twice daily as needed for cough and congestion. Fluids and rest. Prednisone taper. Over the next week > 100% better and no need for any inhalers and stayed on prilosec daily and no hb but continued to need daily decong/ antihistamine   01/02/2012 f/u ov/Wert cc much better transiently then worse x 10 days with cough and subjective wheeze esp at hs and started using saba again. pft's nl on day of ov p using am saba 2 h before pft's.   No   purulent sputum or sinus/hb symptoms on present rx. rec Singulair 10 mg one daily (monalukast) taken in evening should help both nose and and wheezing x one month trial Pepcid 20 mg (over the counter) one at bedtime until return Prednisone 10 mg take  4 each am x 2 days,   2 each am x 2 days,  1 each am x2days and stop  Please schedule a follow up office visit in 6 weeks, call sooner if  needed with all active medications.   02/13/2012 f/u ov/Wert did not bring meds but  cc better to his satisfaction s need for cough meds or daytime saba, not limited by coughing. No limiting sob or variability to c/o persistent mild pnds, no purulent nasal secretions >>decreased benicar 20/12.5  04/26/2012 Acute OV  Complains of prod cough with yellow mucus, wheezing, increased SOB, tightness/burning in chest  x2 weeks.  Mucinex is not helping- unable to tolerate-makes him feel bad.  Was doing very good until 2 weeks ago. Cough had resolved and felt normal until 2 weeks ago.  No chest pain, no fever.  +DOE, wears out easily, increased SABA use.  Cough is very bad. 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  No hemoptysis or edema.  >>omnicef x 7 d and pred taper   05/14/2012 Acute OV  Returns for persistent cough and wheezing.  Seen 2 weeks ago w/ asthmatic bronchitis , tx w/ omnicef and steroid taper .  He says he felt better for a while then symptoms of cough/wheezing started to slowly return.  He finished abx and pred taper, symptoms only slightly improved > prod cough with yellow mucus, increased SOB, wheezing, tightness.  CXR today shows no acute process.  Has sinus pain and  congestion w/ fullness in ears.      ROS Constitutional:   No  weight loss, night sweats,  Fevers, chills,  +fatigue, or  lassitude.  HEENT:   No headaches,  Difficulty swallowing,  Tooth/dental problems, or  Sore throat,                No sneezing, itching, ear ache,  +nasal congestion, post nasal drip,   CV:  No chest pain,  Orthopnea, PND, swelling in lower extremities, anasarca, dizziness, palpitations, syncope.   GI  No heartburn, indigestion, abdominal pain, nausea, vomiting, diarrhea, change in bowel habits, loss of appetite, bloody stools.   Resp:  No coughing up of blood.    No chest wall deformity  Skin: no rash or lesions.  GU: no dysuria, change in color of  urine, no urgency or frequency.  No flank pain, no hematuria   MS:  No joint pain or swelling.  No decreased range of motion.  No back pain.  Psych:  No change in mood or affect. No depression or anxiety.  No memory loss.            Objective:   Physical Exam  amb obese wm nad   Wt 224 11/14/2011 >219 11/24/2011 > 01/02/2012  220 > 02/13/2012  224>224 04/26/2012 >224 05/14/2012    GEN: A/Ox3; pleasant , NAD  HEENT:  [place]/AT,  EACs-clear, TMs-wnl, NOSE-clear, THROAT-clear drainage ,sinus tenderness , no lesions, no postnasal drip or exudate noted.   NECK:  Supple w/ fair ROM; no JVD; normal carotid impulses w/o bruits; no thyromegaly or nodules palpated; no lymphadenopathy.  RESP  Diminished BS in bases .no accessory muscle use, no dullness to percussion  CARD:  RRR, no m/r/g  , no peripheral edema, pulses intact, no cyanosis or clubbing.  GI:   Soft & nt; nml bowel sounds; no organomegaly or masses detected.  Musco: Warm bil, no deformities or joint swelling noted.         cxr 05/14/12 wnl        Assessment & Plan:

## 2012-05-16 NOTE — Progress Notes (Signed)
Quick Note:  Called spoke with patient, advised of cxr results / recs as stated by TP. Pt verbalized his understanding and denied any questions. ______ 

## 2012-05-18 NOTE — Assessment & Plan Note (Signed)
Slow to resolve flare w/ sinusitis   Plan Augmentin 875mg  Twice daily  For 10 days , take w/ food.  Mucinex DM Twice daily  As needed  Cough/congestion   Saline nasal rinses as needed Prednisone taper. Over the next week. Take with food Begin Symbicort 80/4.70mcg 2 puffs Twice daily  -brush/rinse/gargle after use.  Please contact office for sooner follow up if symptoms do not improve or worsen or seek emergency care  follow up Dr. Sherene Sires  In 6 weeks and As needed

## 2012-06-07 ENCOUNTER — Ambulatory Visit (INDEPENDENT_AMBULATORY_CARE_PROVIDER_SITE_OTHER): Payer: 59 | Admitting: Internal Medicine

## 2012-06-07 ENCOUNTER — Encounter: Payer: Self-pay | Admitting: Internal Medicine

## 2012-06-07 VITALS — BP 120/66 | HR 106 | Temp 98.0°F | Ht 70.0 in | Wt 232.2 lb

## 2012-06-07 DIAGNOSIS — R05 Cough: Secondary | ICD-10-CM

## 2012-06-07 DIAGNOSIS — J45909 Unspecified asthma, uncomplicated: Secondary | ICD-10-CM

## 2012-06-07 MED ORDER — BUDESONIDE-FORMOTEROL FUMARATE 80-4.5 MCG/ACT IN AERO: 2.0000 | INHALATION_SPRAY | Freq: Two times a day (BID) | RESPIRATORY_TRACT | Status: DC

## 2012-06-07 NOTE — Assessment & Plan Note (Addendum)
-   PFT's 01/02/2012  wnl p saba 2 h before study    - Singulair rx started 01/02/2012 > try off 06/07/2012   The proper method of use, as well as anticipated side effects, of a metered-dose inhaler are discussed and demonstrated to the patient. Improved effectiveness after extensive coaching during this visit to a level of approximately  75%  All goals of chronic asthma control met including optimal function and elimination of symptoms with minimal need for rescue therapy.  Contingencies discussed in full including contacting this office immediately if not controlling the symptoms using the rule of two's.     Next step is try off singulair    Each maintenance medication was reviewed in detail including most importantly the difference between maintenance and as needed and under what circumstances the prns are to be used.  Please see instructions for details which were reviewed in writing and the patient given a copy.

## 2012-06-07 NOTE — Patient Instructions (Addendum)
Ok to try off singulair to see if any respiratory symptoms flare and if not just leave it off   If you are satisfied with your treatment plan let your doctor know and he/she can either refill your medications or you can return here when your prescription runs out.     If in any way you are not 100% satisfied,  please tell us.  If 100% better, tell your friends!

## 2012-06-07 NOTE — Assessment & Plan Note (Addendum)
In retrospect this was most likely  Classic Upper airway cough syndrome, so named because it's frequently impossible to sort out how much is  CR/sinusitis with freq throat clearing (which can be related to primary GERD)   vs  causing  secondary (" extra esophageal")  GERD from wide swings in gastric pressure that occur with throat clearing, often  promoting self use of mint and menthol lozenges that reduce the lower esophageal sphincter tone and exacerbate the problem further in a cyclical fashion.   These are the same pts (now being labeled as having "irritable larynx syndrome" by some cough centers) who not infrequently have a history of having failed to tolerate ace inhibitors as was probably the case here ,  dry powder inhalers or biphosphonates or report having atypical reflux symptoms that don't respond to standard doses of PPI , and are easily confused as having aecopd or asthma flares or refractory asthma symptoms ( which may hav been the case here)  by even experienced allergists/ pulmonologists.   He's doing great off acei, but he flared off gerd rx so makes the most sense to continue gerd rx, keep off acei and try backing off the asthma  rx by stopping singulair (a reverse therapeutic trial if you will) to see what if any symptoms flare now. Pulmonary f/u can be prn

## 2012-06-07 NOTE — Progress Notes (Signed)
Subjective:    Patient ID: Tony Douglas, male    DOB: June 14, 1951   MRN: [id number]   Primary = Tony Douglas in [place]  .Brief patient profile:   61 yowm quit smoking in 1994 no problems at all then referred by Tony Douglas 11/14/2011 for  Refractory cough and sob  With nl pft's p bronchodilators documented 02/2012   11/14/2011 1st pulmonary eval cc new onset sob x one year much worse when coughing esp over the last 5 weeks.  Cough min productive of yellow rx with augmentin and levoquin  And  no better. No real variability, no better on daytime saba. Sob not necessarily proproportinate to activity. No better on spiriva >> Stop spiriva, stop lotensin Start Benicar 20/12.5 one half daily Prilosec 20 mg Take 30- 60 min before your first and last meals of the day until no longer cough If still cough>  tramdol 50 mg 1-2 every 4 hours as needed  11/24/2011 Acute OV  Complains of productive cough with yellow mucus, tightness in chest, wheezing, increased SOB for 1 week.   Initially had GI virus w/ n/v and fever that resolved after 2 days. Also had cough and congestion.  Still has cough and congestion. Wheezing is no better.  rec Augmentin 875 mg twice daily, take with food. Mucinex DM twice daily as needed for cough and congestion. Fluids and rest. Prednisone taper. Over the next week > 100% better and no need for any inhalers and stayed on prilosec daily and no hb but continued to need daily decong/ antihistamine   01/02/2012 f/u ov/Tony Douglas cc much better transiently then worse x 10 days with cough and subjective wheeze esp at hs and started using saba again. pft's nl on day of ov p using am saba 2 h before pft's.   No   purulent sputum or sinus/hb symptoms on present rx. rec Singulair 10 mg one daily (monalukast) taken in evening should help both nose and and wheezing x one month trial Pepcid 20 mg (over the counter) one at bedtime until return Prednisone 10 mg take  4 each am x 2 days,   2 each am x 2  days,  1 each am x2days and stop  Please schedule a follow up office visit in 6 weeks, call sooner if needed with all active medications.   02/13/2012 f/u ov/Tony Douglas did not bring meds but  cc better to his satisfaction s need for cough meds or daytime saba, not limited by coughing. No limiting sob or variability to c/o persistent mild pnds, no purulent nasal secretions >>decreased benicar 20/12.5  04/26/2012 Acute OV  Complains of prod cough with yellow mucus, wheezing, increased SOB, tightness/burning in chest  x2 weeks.  Mucinex is not helping- unable to tolerate-makes him feel bad.  Was doing very good until 2 weeks ago. Cough had resolved and felt normal until 2 weeks ago.  No chest pain, no fever.  +DOE, wears out easily, increased SABA use.  Cough is very bad. 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  No hemoptysis or edema.  >>omnicef x 7 d and pred taper   05/14/2012 Acute OV  Returns for persistent cough and wheezing.  Seen 2 weeks ago w/ asthmatic bronchitis , tx w/ omnicef and steroid taper .  He says he felt better for a while then symptoms of cough/wheezing started to slowly return.  He finished abx and pred taper, symptoms only slightly improved > prod cough  with yellow mucus, increased SOB, wheezing, tightness.  CXR today shows no acute process.  Has sinus pain and congestion w/ fullness in ears.  rec Augmentin 875mg  Twice daily  For 10 days , take w/ food.  Mucinex DM Twice daily  As needed  Cough/congestion   Saline nasal rinses as needed Prednisone taper. Over the next week. Take with food Begin Symbicort 80/4.43mcg 2 puffs Twice daily  -brush/rinse/gargle after use.    06/07/2012 f/u ov/Tony Douglas cc breathing doing great, no need for rescue at all.  No obvious daytime variabilty or assoc chronic cough or cp or chest tightness, subjective wheeze overt sinus or hb symptoms. No unusual exp hx or h/o childhood pna/ asthma or premature birth to his  knowledge.   Sleeping ok without nocturnal  or early am exacerbation  of respiratory  c/o's or need for noct saba. Also denies any obvious fluctuation of symptoms with weather or environmental changes or other aggravating or alleviating factors except as outlined above   ROS  The following are not active complaints unless bolded sore throat, dysphagia, dental problems, itching, sneezing,  nasal congestion or excess/ purulent secretions, ear ache,   fever, chills, sweats, unintended wt loss, pleuritic or exertional cp, hemoptysis,  orthopnea pnd or leg swelling, presyncope, palpitations, heartburn, abdominal pain, anorexia, nausea, vomiting, diarrhea  or change in bowel or urinary habits, change in stools or urine, dysuria,hematuria,  rash, arthralgias, visual complaints, headache, numbness weakness or ataxia or problems with walking or coordination,  change in mood/affect or memory.                       Objective:   Physical Exam  amb obese wm nad   Wt 224 11/14/2011 >  02/13/2012  224>224 04/26/2012 >224 05/14/2012 > 232 06/07/2012    GEN: A/Ox3; pleasant , NAD  HEENT:  [place]/AT,  EACs-clear, TMs-wnl, NOSE-clear, THROAT-clear drainage ,sinus tenderness , no lesions, no postnasal drip or exudate noted.   NECK:  Supple w/ fair ROM; no JVD; normal carotid impulses w/o bruits; no thyromegaly or nodules palpated; no lymphadenopathy.  RESP  Diminished BS in bases .no accessory muscle use, no dullness to percussion  CARD:  RRR, no m/r/g  , no peripheral edema, pulses intact, no cyanosis or clubbing.  GI:   Soft & nt; nml bowel sounds; no organomegaly or masses detected.  Musco: Warm bil, no deformities or joint swelling noted.         cxr 05/14/12 wnl        Assessment & Plan:

## 2012-09-27 ENCOUNTER — Encounter: Payer: Self-pay | Admitting: Internal Medicine

## 2013-01-28 ENCOUNTER — Telehealth: Payer: Self-pay | Admitting: Internal Medicine

## 2013-01-28 DIAGNOSIS — J45909 Unspecified asthma, uncomplicated: Secondary | ICD-10-CM

## 2013-01-28 MED ORDER — MONTELUKAST SODIUM 10 MG PO TABS: 10.0000 mg | ORAL_TABLET | Freq: Every day | ORAL | Status: DC

## 2013-01-28 NOTE — Telephone Encounter (Signed)
OV 06/07/12 Patient Instructions    Ok to try off singulair to see if any respiratory symptoms flare and if not just leave it off  If you are satisfied with your treatment plan let your doctor know and he/she can either refill your medications or you can return here when your prescription runs out.  If in any way you are not 100% satisfied, please tell us. If 100% better, tell your friends!   Per [place] pt called in for refill. I advised will need to speak with pt to see if he ever stopped this, etc. LMTCB x1 for pt

## 2013-01-28 NOTE — Telephone Encounter (Signed)
Fine with me to send another 6 m then needs ov if  I'm being asked to refill it

## 2013-01-28 NOTE — Telephone Encounter (Signed)
Pt returned triage's call.  Holly D Pryor ° °

## 2013-01-28 NOTE — Telephone Encounter (Signed)
Pt scheduled appt for 02/11/13. rx has been sent. Nothing further was needed

## 2013-01-28 NOTE — Telephone Encounter (Signed)
I spoke with pt. He stated he did not come off the Singulair bc when he would try his breathing would "mess up". So he has been taking this daily. Pt has no pending appt. Please advise if okay to refill the Singulair Dr. Sherene Sires thanks

## 2013-01-30 ENCOUNTER — Encounter: Payer: Self-pay | Admitting: Internal Medicine

## 2013-02-08 ENCOUNTER — Other Ambulatory Visit: Payer: Self-pay | Admitting: Internal Medicine

## 2013-02-08 MED ORDER — OLMESARTAN MEDOXOMIL-HCTZ 20-12.5 MG PO TABS: 1.0000 | ORAL_TABLET | Freq: Every day | ORAL | Status: DC

## 2013-02-11 ENCOUNTER — Ambulatory Visit (INDEPENDENT_AMBULATORY_CARE_PROVIDER_SITE_OTHER): Payer: 59 | Admitting: Internal Medicine

## 2013-02-11 ENCOUNTER — Encounter: Payer: Self-pay | Admitting: Internal Medicine

## 2013-02-11 VITALS — BP 118/72 | HR 107 | Temp 98.0°F | Ht 70.0 in | Wt 234.4 lb

## 2013-02-11 DIAGNOSIS — J45909 Unspecified asthma, uncomplicated: Secondary | ICD-10-CM

## 2013-02-11 MED ORDER — MONTELUKAST SODIUM 10 MG PO TABS: 10.0000 mg | ORAL_TABLET | Freq: Every day | ORAL | Status: AC

## 2013-02-11 MED ORDER — BUDESONIDE-FORMOTEROL FUMARATE 160-4.5 MCG/ACT IN AERO: INHALATION_SPRAY | RESPIRATORY_TRACT | Status: DC

## 2013-02-11 NOTE — Progress Notes (Signed)
Subjective:    Patient ID: Tony Douglas, male    DOB: July 08, 1950   MRN: [id number]   Primary = Tony Douglas in [place]  .Brief patient profile:   62 yowm quit smoking in 1994 no problems at all then referred by Tony Douglas 11/14/2011 for  Refractory cough and sob  With nl pft's p bronchodilators documented 02/2012   11/14/2011 1st pulmonary eval cc new onset sob x one year much worse when coughing esp over the last 5 weeks.  Cough min productive of yellow rx with augmentin and levoquin  And  no better. No real variability, no better on daytime saba. Sob not necessarily proproportinate to activity. No better on spiriva >> Stop spiriva, stop lotensin Start Benicar 20/12.5 one half daily Prilosec 20 mg Take 30- 60 min before your first and last meals of the day until no longer cough If still cough>  tramdol 50 mg 1-2 every 4 hours as needed     01/02/2012 f/u ov/Tony Douglas cc much better transiently then worse x 10 days with cough and subjective wheeze esp at hs and started using saba again. pft's nl on day of ov p using am saba 2 h before pft's.   No   purulent sputum or sinus/hb symptoms on present rx. rec Singulair 10 mg one daily (monalukast) taken in evening should help both nose and and wheezing x one month trial Pepcid 20 mg (over the counter) one at bedtime until return Prednisone 10 mg take  4 each am x 2 days,   2 each am x 2 days,  1 each am x2days and stop      06/07/2012 f/u ov/Tony Douglas cc breathing doing great, no need for rescue at all. rec Ok to try off singulair to see if any respiratory symptoms flare and if not just leave it off    02/11/2013 f/u ov/Tony Douglas re asthma maintaining singular and symbicort because flares off Chief Complaint  Patient presents with  . Follow-up    Breathing seems worse with humidity- using ventolin bid for the past several wks.   does not have ventolin with him and not sure it's the ventolin or the sitting down and resting but he has more doe with humid weather  and also since gained wt.  No obvious daytime variabilty or assoc chronic cough or cp or chest tightness, subjective wheeze overt sinus or hb symptoms. No unusual exp hx or h/o childhood pna/ asthma or knowledge of premature birth.   Sleeping ok without nocturnal  or early am exacerbation  of respiratory  c/o's or need for noct saba. Also denies any obvious fluctuation of symptoms with weather or environmental changes or other aggravating or alleviating factors except as outlined above   Current Medications, Allergies, Past Medical History, Past Surgical History, Family History, and Social History were reviewed in [employer] record.  ROS  The following are not active complaints unless bolded sore throat, dysphagia, dental problems, itching, sneezing,  nasal congestion or excess/ purulent secretions, ear ache,   fever, chills, sweats, unintended wt loss, pleuritic or exertional cp, hemoptysis,  orthopnea pnd or leg swelling, presyncope, palpitations, heartburn, abdominal pain, anorexia, nausea, vomiting, diarrhea  or change in bowel or urinary habits, change in stools or urine, dysuria,hematuria,  rash, arthralgias, visual complaints, headache, numbness weakness or ataxia or problems with walking or coordination,  change in mood/affect or memory.  Objective:   Physical Exam  amb obese wm nad   Wt 224 11/14/2011 >  02/13/2012  224>224 04/26/2012 >224 05/14/2012 > 232 06/07/2012 > 235 02/11/2013    GEN: A/Ox3; pleasant , NAD  HEENT:  [place]/AT,  EACs-clear, TMs-wnl, NOSE-clear, THROAT-clear drainage ,sinus tenderness , no lesions, no postnasal drip or exudate noted.   NECK:  Supple w/ fair ROM; no JVD; normal carotid impulses w/o bruits; no thyromegaly or nodules palpated; no lymphadenopathy.  RESP  Diminished BS in bases .no accessory muscle use, no dullness to percussion  CARD:  RRR, no m/r/g  , no peripheral edema, pulses intact, no cyanosis or  clubbing.  GI:   Soft & nt; nml bowel sounds; no organomegaly or masses detected.  Musco: Warm bil, no deformities or joint swelling noted.         cxr 05/14/12 wnl        Assessment & Plan:

## 2013-02-11 NOTE — Patient Instructions (Addendum)
Try symbicort 160 Take 2 puffs first thing in am and then another 2 puffs about 12 hours later and after about a week you should see the difference if not just resume the lower strength   If you are satisfied with your treatment plan let your doctor know and he/she can either refill your medications or you can return here when your prescription runs out.     If in any way you are not 100% satisfied,  please tell us.  If 100% better, tell your friends!

## 2013-02-11 NOTE — Assessment & Plan Note (Signed)
-   PFT's 01/02/2012  wnl p saba 2 h before study    - Singulair rx started 01/02/2012 > try off 06/07/2012 > worse     The proper method of use, as well as anticipated side effects, of a metered-dose inhaler are discussed and demonstrated to the patient. Improved effectiveness after extensive coaching during this visit to a level of approximately  90%   Since asthma goals not met on symbiocrt 80 2bid try the 160 with intent of minimizing saba.  Previous attempt to stop singulair made symptoms worse so ok to continue indefinitely   F/u can be prn, refills per primary care.

## 2013-03-15 ENCOUNTER — Ambulatory Visit (AMBULATORY_SURGERY_CENTER): Payer: Self-pay | Admitting: *Deleted

## 2013-03-15 VITALS — Ht 70.0 in | Wt 227.4 lb

## 2013-03-15 DIAGNOSIS — Z1211 Encounter for screening for malignant neoplasm of colon: Secondary | ICD-10-CM

## 2013-03-15 MED ORDER — MOVIPREP 100 G PO SOLR: 1.0000 | Freq: Once | ORAL | Status: DC

## 2013-03-15 NOTE — Progress Notes (Signed)
No egg or soy allergy. No anesthesia problems.  

## 2013-03-20 ENCOUNTER — Encounter: Payer: Self-pay | Admitting: Internal Medicine

## 2013-04-03 ENCOUNTER — Ambulatory Visit (AMBULATORY_SURGERY_CENTER): Payer: 59 | Admitting: Internal Medicine

## 2013-04-03 ENCOUNTER — Encounter: Payer: Self-pay | Admitting: Internal Medicine

## 2013-04-03 VITALS — BP 146/83 | HR 81 | Temp 97.3°F | Resp 16 | Ht 70.0 in | Wt 227.0 lb

## 2013-04-03 DIAGNOSIS — Z1211 Encounter for screening for malignant neoplasm of colon: Secondary | ICD-10-CM

## 2013-04-03 DIAGNOSIS — D126 Benign neoplasm of colon, unspecified: Secondary | ICD-10-CM

## 2013-04-03 MED ORDER — SODIUM CHLORIDE 0.9 % IV SOLN: 500.0000 mL | INTRAVENOUS | Status: DC

## 2013-04-03 NOTE — Progress Notes (Signed)
Called to room to assist during endoscopic procedure.  Patient ID and intended procedure confirmed with present staff. Received instructions for my participation in the procedure from the performing physician.  

## 2013-04-03 NOTE — Patient Instructions (Addendum)
YOU HAD AN ENDOSCOPIC PROCEDURE TODAY AT THE [facility]: Refer to the procedure report that was given to you for any specific questions about what was found during the examination.  If the procedure report does not answer your questions, please call your gastroenterologist to clarify.  If you requested that your care partner not be given the details of your procedure findings, then the procedure report has been included in a sealed envelope for you to review at your convenience later.  YOU SHOULD EXPECT: Some feelings of bloating in the abdomen. Passage of more gas than usual.  Walking can help get rid of the air that was put into your GI tract during the procedure and reduce the bloating. If you had a lower endoscopy (such as a colonoscopy or flexible sigmoidoscopy) you may notice spotting of blood in your stool or on the toilet paper. If you underwent a bowel prep for your procedure, then you may not have a normal bowel movement for a few days.  DIET: Your first meal following the procedure should be a light meal and then it is ok to progress to your normal diet.  A half-sandwich or bowl of soup is an example of a good first meal.  Heavy or fried foods are harder to digest and may make you feel nauseous or bloated.  Likewise meals heavy in dairy and vegetables can cause extra gas to form and this can also increase the bloating.  Drink plenty of fluids but you should avoid alcoholic beverages for 24 hours.  ACTIVITY: Your care partner should take you home directly after the procedure.  You should plan to take it easy, moving slowly for the rest of the day.  You can resume normal activity the day after the procedure however you should NOT DRIVE or use heavy machinery for 24 hours (because of the sedation medicines used during the test).    SYMPTOMS TO REPORT IMMEDIATELY: A gastroenterologist can be reached at any hour.  During normal business hours, 8:30 AM to 5:00 PM Monday through Friday,  call [phone].  After hours and on weekends, please call the GI answering service at [phone] who will take a message and have the physician on call contact you.   Following lower endoscopy (colonoscopy or flexible sigmoidoscopy):  Excessive amounts of blood in the stool  Significant tenderness or worsening of abdominal pains  Swelling of the abdomen that is new, acute  Fever of 100F or higher   FOLLOW UP: If any biopsies were taken you will be contacted by phone or by letter within the next 1-3 weeks.  Call your gastroenterologist if you have not heard about the biopsies in 3 weeks.  Our staff will call the home number listed on your records the next business day following your procedure to check on you and address any questions or concerns that you may have at that time regarding the information given to you following your procedure. This is a courtesy call and so if there is no answer at the home number and we have not heard from you through the emergency physician on call, we will assume that you have returned to your regular daily activities without incident.  SIGNATURES/CONFIDENTIALITY: You and/or your care partner have signed paperwork which will be entered into your electronic medical record.  These signatures attest to the fact that that the information above on your After Visit Summary has been reviewed and is understood.  Full responsibility of the confidentiality of   this discharge information lies with you and/or your care-partner.   Resume medications. Information on polyps, diverticulosis and high fiber diet given with discharge instructions. 

## 2013-04-03 NOTE — Op Note (Signed)
[facility] [address] [place], [address]   COLONOSCOPY PROCEDURE REPORT  PATIENT: Kong, Packett  MR#: [id number] BIRTHDATE: 1951/04/03 , 62  yrs. old GENDER: Male ENDOSCOPIST: Hart Carwin, MD REFERRED BY:Dr [name] PROCEDURE DATE:  04/03/2013 PROCEDURE:   Colonoscopy with cold biopsy polypectomy First Screening Colonoscopy - Avg.  risk and is 50 yrs.  old or older - No.  Prior Negative Screening - Now for repeat screening. 10 or more years since last screening  History of Adenoma - Now for follow-up colonoscopy & has been > or = to 3 yrs.  N/A  Polyps Removed Today? Yes. ASA CLASS:   Class II INDICATIONS:Average risk patient for colon cancer. MEDICATIONS: MAC sedation, administered by CRNA and propofol (Diprivan) 200mg  IV  DESCRIPTION OF PROCEDURE:   After the risks benefits and alternatives of the procedure were thoroughly explained, informed consent was obtained.  A digital rectal exam revealed no abnormalities of the rectum.   The LB [id number]  endoscope was introduced through the anus and advanced to the cecum, which was identified by both the appendix and ileocecal valve. No adverse events experienced.   The quality of the prep was good, using MoviPrep  The instrument was then slowly withdrawn as the colon was fully examined.      COLON FINDINGS: A polypoid shaped sessile polyp ranging between 3-59mm in size was found in the sigmoid colon.  A polypectomy was performed with cold forceps.  The resection was complete and the polyp tissue was completely retrieved.  Retroflexed views revealed no abnormalities. The time to cecum=4 minutes 38 seconds. Withdrawal time=61 minutes 0 seconds.  The scope was withdrawn and the procedure completed. COMPLICATIONS: There were no complications.  ENDOSCOPIC IMPRESSION: 1.   Sessile polyp ranging between 3-14mm in size was found in the sigmoid colon; polypectomy was performed with cold forceps 2.     mild diverticulosis of the sigmoid colon  RECOMMENDATIONS: 1.  Await pathology results 2.  High fiber diet 3.   recall colon pending biopsy results   eSigned:  Hart Carwin, MD 04/03/2013 12:28 PM   cc:   PATIENT NAME:  Tony Douglas, Tony Douglas MR#: [id number]

## 2013-04-04 ENCOUNTER — Telehealth: Payer: Self-pay | Admitting: *Deleted

## 2013-04-04 NOTE — Telephone Encounter (Signed)
Name identifier, left message, follow-up 

## 2013-04-08 ENCOUNTER — Encounter: Payer: Self-pay | Admitting: Internal Medicine

## 2013-05-09 ENCOUNTER — Other Ambulatory Visit: Payer: Self-pay

## 2013-10-17 ENCOUNTER — Telehealth: Payer: Self-pay | Admitting: Internal Medicine

## 2013-10-17 ENCOUNTER — Telehealth: Payer: Self-pay | Admitting: *Deleted

## 2013-10-17 MED ORDER — MOMETASONE FURO-FORMOTEROL FUM 200-5 MCG/ACT IN AERO: 2.0000 | INHALATION_SPRAY | Freq: Two times a day (BID) | RESPIRATORY_TRACT | Status: DC

## 2013-10-17 MED ORDER — BUDESONIDE-FORMOTEROL FUMARATE 160-4.5 MCG/ACT IN AERO: INHALATION_SPRAY | RESPIRATORY_TRACT | Status: AC

## 2013-10-17 NOTE — Telephone Encounter (Signed)
RX has been sent. Nothing further needed 

## 2013-10-17 NOTE — Telephone Encounter (Signed)
Rx for [facility] was sent

## 2013-10-17 NOTE — Telephone Encounter (Signed)
dulera 200 2bid will do

## 2013-10-17 NOTE — Telephone Encounter (Signed)
Received faxed from [facility]. Symbicort is no longer preferred. Looks like Social worker. Please advise MW thanks

## 2013-10-17 NOTE — Telephone Encounter (Signed)
[facility] calling stating symbicort is no longer covered by patient's insurance.

## 2014-05-01 ENCOUNTER — Other Ambulatory Visit: Payer: Self-pay | Admitting: Dermatology

## 2014-05-06 ENCOUNTER — Other Ambulatory Visit: Payer: Self-pay | Admitting: Physician Assistant

## 2014-05-06 NOTE — H&P (Signed)
TOTAL HIP ADMISSION H&P  Patient is admitted for right total hip arthroplasty.  Subjective:  Chief Complaint: right hip pain  HPI: Tony Douglas, 63 y.o. male, has a history of pain and functional disability in the right hip(s) due to arthritis and patient has failed non-surgical conservative treatments for greater than 12 weeks to include NSAID's and/or analgesics and corticosteriod injections.  Onset of symptoms was gradual starting 1 years ago with rapidlly worsening course since that time.The patient noted no past surgery on the right hip(s).  Patient currently rates pain in the right hip at 6 out of 10 with activity. Patient has night pain, worsening of pain with activity and weight bearing and trendelenberg gait. Patient has evidence of subchondral cysts, subchondral sclerosis and joint space narrowing by imaging studies. This condition presents safety issues increasing the risk of falls.  There is no current active infection.  Patient Active Problem List   Diagnosis Date Noted  . Cough 11/14/2011  . Hypertension 11/14/2011  . Asthma, extrinsic 11/14/2011   Past Medical History  Diagnosis Date  . Obesity   . Osteoarthritis   . COPD (chronic obstructive pulmonary disease)   . Depression   . GERD (gastroesophageal reflux disease)   . Diverticular disease   . Hyperlipidemia, mixed   . Hypertension   . Allergic rhinitis   . Esophageal reflux   . BPH (benign prostatic hyperplasia)   . Anxiety     Past Surgical History  Procedure Laterality Date  . Total knee arthroplasty  2011    right  . Shoulder surgery  2008    right  . Appendectomy    . Colectomy  2004    laproscopic sigmoid colectomy     (Not in a hospital admission) No Known Allergies  History  Substance Use Topics  . Smoking status: Former Smoker -- 1.00 packs/day for 30 years    Types: Cigarettes    Quit date: 07/04/1992  . Smokeless tobacco: Never Used  . Alcohol Use: Yes     Comment: 1 or 2 beers per  month    Family History  Problem Relation Age of Onset  . Emphysema Father     smoked and worked in [employer]  . Allergies Brother   . Asthma Brother   . Heart disease Mother      Review of Systems  Constitutional: Negative.   HENT: Negative.   Respiratory: Negative.   Cardiovascular: Negative.   Gastrointestinal: Negative.   Genitourinary: Negative.   Musculoskeletal: Positive for back pain and joint pain.  Skin: Negative.   Neurological: Negative.   Endo/Heme/Allergies: Negative.   Psychiatric/Behavioral: Negative.     Objective:  Physical Exam  Constitutional: He is oriented to person, place, and time. He appears well-developed and well-nourished.  HENT:  Head: Normocephalic and atraumatic.  Eyes: EOM are normal. Pupils are equal, round, and reactive to light.  Neck: Normal range of motion. Neck supple.  Cardiovascular: Normal rate, regular rhythm and normal heart sounds.  Exam reveals no gallop and no friction rub.   No murmur heard. Respiratory: Effort normal and breath sounds normal. No respiratory distress. He has no wheezes. He has no rales.  GI: Soft. Bowel sounds are normal.  Musculoskeletal:  Examination of his right hip reveals positive straight leg raise, positive log roll, positive Stinchfield's on the right.  Negative on the left.  He is neurovascularly intact distally.  Neurological: He is alert and oriented to person, place, and time.  Skin: Skin is  warm and dry.  Psychiatric: He has a normal mood and affect. His behavior is normal. Judgment and thought content normal.    Vital signs in last 24 hours: @VSRANGES @  Labs:   Estimated body mass index is 32.57 kg/(m^2) as calculated from the following:   Height as of 04/03/13: 5\' 10"  (1.778 m).   Weight as of 04/03/13: 102.967 kg (227 lb).   Imaging Review Plain radiographs demonstrate severe degenerative joint disease of the right hip(s). The bone quality appears to be fair for age and reported activity  level.  Assessment/Plan:  End stage arthritis, right hip(s)  The patient history, physical examination, clinical judgement of the provider and imaging studies are consistent with end stage degenerative joint disease of the right hip(s) and total hip arthroplasty is deemed medically necessary. The treatment options including medical management, injection therapy, arthroscopy and arthroplasty were discussed at length. The risks and benefits of total hip arthroplasty were presented and reviewed. The risks due to aseptic loosening, infection, stiffness, dislocation/subluxation,  thromboembolic complications and other imponderables were discussed.  The patient acknowledged the explanation, agreed to proceed with the plan and consent was signed. Patient is being admitted for inpatient treatment for surgery, pain control, PT, OT, prophylactic antibiotics, VTE prophylaxis, progressive ambulation and ADL's and discharge planning.The patient is planning to be discharged home with home health services

## 2014-05-09 NOTE — Pre-Procedure Instructions (Signed)
ASHBY MOSKAL  05/09/2014   Your procedure is scheduled on:  Wednesday, November 18.  Report to [facility] Admitting at 6:30 AM.  Call this number if you have problems the morning of surgery: [phone]   Remember:   Do not eat food or drink liquids after midnight Tuesday, November 17.   Take these medicines the morning of surgery with A SIP OF WATER: buPROPion (WELLBUTRIN SR),  cetirizine (ZYRTEC), omeprazole (PRILOSEC), dutasteride (AVODART).             May use inhalers and Bring Albuterol inhaler to the hospital with you. May take dextromethorphan-guaiFENesin [facility] DM) if needed.             Stop taking Vitamins and ibuprofen.  Do not take any Aspirin or aspirin products or Naproxen (Aleve)- stop on Wednesday November 11.   Do not wear jewelry, make-up or nail polish.  Do not wear lotions, powders, or perfumes.  Men may shave face and neck.  Do not bring valuables to the hospital.               [facility] is not responsible for any belongings or valuables.               Contacts, dentures or bridgework may not be worn into surgery.  Leave suitcase in the car. After surgery it may be brought to your room.  For patients admitted to the hospital, discharge time is determined by your  treatment team.                 Special Instructions: Review  [facility] - Preparing For Surgery.   Please read over the following fact sheets that you were given: Pain Booklet, Coughing and Deep Breathing, Blood Transfusion Information and Surgical Site Infection Prevention and Incentive Spirometry.

## 2014-05-12 ENCOUNTER — Encounter (HOSPITAL_COMMUNITY)
Admission: RE | Admit: 2014-05-12 | Discharge: 2014-05-12 | Disposition: A | Discharge status: Home / Self Care | Payer: 59 | Source: Ambulatory Visit | Attending: Orthopedic Surgery | Admitting: Orthopedic Surgery

## 2014-05-12 ENCOUNTER — Encounter (HOSPITAL_COMMUNITY): Payer: Self-pay

## 2014-05-12 DIAGNOSIS — M1611 Unilateral primary osteoarthritis, right hip: Secondary | ICD-10-CM | POA: Insufficient documentation

## 2014-05-12 DIAGNOSIS — Z01810 Encounter for preprocedural cardiovascular examination: Secondary | ICD-10-CM | POA: Insufficient documentation

## 2014-05-12 DIAGNOSIS — Z01812 Encounter for preprocedural laboratory examination: Secondary | ICD-10-CM | POA: Insufficient documentation

## 2014-05-12 HISTORY — DX: Other specified postprocedural states: Z98.890

## 2014-05-12 HISTORY — DX: Adverse effect of unspecified anesthetic, initial encounter: T41.45XA

## 2014-05-12 HISTORY — DX: Constipation, unspecified: K59.00

## 2014-05-12 HISTORY — DX: Nausea with vomiting, unspecified: R11.2

## 2014-05-12 LAB — CBC WITH DIFFERENTIAL/PLATELET
Basophils Absolute: 0.1 10*3/uL (ref 0.0–0.1)
Basophils Relative: 1 % (ref 0–1)
Eosinophils Absolute: 0.2 10*3/uL (ref 0.0–0.7)
Eosinophils Relative: 2 % (ref 0–5)
HEMATOCRIT: 40.1 % (ref 39.0–52.0)
Hemoglobin: 14 g/dL (ref 13.0–17.0)
LYMPHS PCT: 21 % (ref 12–46)
Lymphs Abs: 1.5 10*3/uL (ref 0.7–4.0)
MCH: 30.8 pg (ref 26.0–34.0)
MCHC: 34.9 g/dL (ref 30.0–36.0)
MCV: 88.3 fL (ref 78.0–100.0)
MONO ABS: 0.5 10*3/uL (ref 0.1–1.0)
Monocytes Relative: 7 % (ref 3–12)
Neutro Abs: 5.1 10*3/uL (ref 1.7–7.7)
Neutrophils Relative %: 69 % (ref 43–77)
Platelets: 202 10*3/uL (ref 150–400)
RBC: 4.54 MIL/uL (ref 4.22–5.81)
RDW: 12.7 % (ref 11.5–15.5)
WBC: 7.3 10*3/uL (ref 4.0–10.5)

## 2014-05-12 LAB — COMPREHENSIVE METABOLIC PANEL
ALT: 27 U/L (ref 0–53)
AST: 20 U/L (ref 0–37)
Albumin: 4 g/dL (ref 3.5–5.2)
Alkaline Phosphatase: 61 U/L (ref 39–117)
Anion gap: 14 (ref 5–15)
BUN: 21 mg/dL (ref 6–23)
CO2: 23 meq/L (ref 19–32)
CREATININE: 1.04 mg/dL (ref 0.50–1.35)
Calcium: 9.7 mg/dL (ref 8.4–10.5)
Chloride: 101 mEq/L (ref 96–112)
GFR, EST AFRICAN AMERICAN: 86 mL/min — AB (ref >90)
GFR, EST NON AFRICAN AMERICAN: 74 mL/min — AB (ref >90)
GLUCOSE: 121 mg/dL — AB (ref 70–99)
Potassium: 4 mEq/L (ref 3.7–5.3)
Sodium: 138 mEq/L (ref 137–147)
Total Bilirubin: 0.4 mg/dL (ref 0.3–1.2)
Total Protein: 7.2 g/dL (ref 6.0–8.3)

## 2014-05-12 LAB — SURGICAL PCR SCREEN
MRSA, PCR: NEGATIVE
Staphylococcus aureus: POSITIVE — AB

## 2014-05-12 LAB — PROTIME-INR
INR: 1.03 (ref 0.00–1.49)
PROTHROMBIN TIME: 13.6 s (ref 11.6–15.2)

## 2014-05-12 LAB — URINALYSIS, ROUTINE W REFLEX MICROSCOPIC
Bilirubin Urine: NEGATIVE
GLUCOSE, UA: NEGATIVE mg/dL
Hgb urine dipstick: NEGATIVE
Ketones, ur: NEGATIVE mg/dL
LEUKOCYTES UA: NEGATIVE
Nitrite: NEGATIVE
PH: 7 (ref 5.0–8.0)
Protein, ur: NEGATIVE mg/dL
Specific Gravity, Urine: 1.009 (ref 1.005–1.030)
Urobilinogen, UA: 0.2 mg/dL (ref 0.0–1.0)

## 2014-05-12 LAB — TYPE AND SCREEN
ABO/RH(D): A NEG
Antibody Screen: NEGATIVE

## 2014-05-12 LAB — APTT: aPTT: 30 seconds (ref 24–37)

## 2014-05-12 NOTE — Progress Notes (Signed)
   05/12/14 1120  OBSTRUCTIVE SLEEP APNEA  Have you ever been diagnosed with sleep apnea through a sleep study? No  Do you snore loudly (loud enough to be heard through closed doors)?  1  Do you often feel tired, fatigued, or sleepy during the daytime? 0  Has anyone observed you stop breathing during your sleep? 0  Do you have, or are you being treated for high blood pressure? 1  BMI more than 35 kg/m2? 0  Age over 63 years old? 1  Neck circumference greater than 40 cm/16 inches? 1 (17.5)  Gender: 1  Obstructive Sleep Apnea Score 5  Score 4 or greater  Results sent to PCP

## 2014-05-12 NOTE — Progress Notes (Signed)
I called a prescription for Mupirocin ointment to [facility] for Mupirocin Ointment.

## 2014-05-13 LAB — URINE CULTURE
CULTURE: NO GROWTH
Colony Count: NO GROWTH

## 2014-05-20 MED ORDER — CEFAZOLIN SODIUM-DEXTROSE 2-3 GM-% IV SOLR
2.0000 g | INTRAVENOUS | Status: AC
  Administered 2014-05-21: 2 g via INTRAVENOUS
  Filled 2014-05-20: qty 50

## 2014-05-20 MED ORDER — TRANEXAMIC ACID 100 MG/ML IV SOLN
1000.0000 mg | INTRAVENOUS | Status: AC
  Administered 2014-05-21: 1000 mg via INTRAVENOUS
  Filled 2014-05-20: qty 10

## 2014-05-20 NOTE — Anesthesia Preprocedure Evaluation (Addendum)
Anesthesia Evaluation  Patient identified by MRN, date of birth, ID band Patient awake    Reviewed: Allergy & Precautions, H&P , NPO status , Patient's Chart, lab work & pertinent test results, reviewed documented beta blocker date and time   History of Anesthesia Complications (+) PONV  Airway Mallampati: II   Neck ROM: Full    Dental  (+) Teeth Intact   Pulmonary asthma , COPD COPD inhaler, former smoker (30 pack year.  quit years ago),  breath sounds clear to auscultation        Cardiovascular hypertension, Pt. on medications Rhythm:Regular  EKG normal   Neuro/Psych    GI/Hepatic GERD-  Medicated,  Endo/Other  Diabetes: likely based on BS   Renal/GU      Musculoskeletal   Abdominal (+)  Abdomen: soft.    Peds  Hematology   Anesthesia Other Findings   Reproductive/Obstetrics                            Anesthesia Physical Anesthesia Plan  ASA: III  Anesthesia Plan: Regional   Post-op Pain Management:    Induction: Intravenous  Airway Management Planned: Oral ETT  Additional Equipment:   Intra-op Plan:   Post-operative Plan: Extubation in OR  Informed Consent: I have reviewed the patients History and Physical, chart, labs and discussed the procedure including the risks, benefits and alternatives for the proposed anesthesia with the patient or authorized representative who has indicated his/her understanding and acceptance.     Plan Discussed with:   Anesthesia Plan Comments: (Multinodal pain rx, Tylenol, lidocaine, decadron, precedex, toradal, ketamine?)        Anesthesia Quick Evaluation

## 2014-05-21 ENCOUNTER — Inpatient Hospital Stay (HOSPITAL_COMMUNITY): Payer: 59 | Admitting: Anesthesiology

## 2014-05-21 ENCOUNTER — Encounter (HOSPITAL_COMMUNITY)
Admission: RE | Disposition: A | Discharge status: Home Health | Payer: Self-pay | Source: Ambulatory Visit | Attending: Orthopedic Surgery

## 2014-05-21 ENCOUNTER — Inpatient Hospital Stay (HOSPITAL_COMMUNITY)
Admission: RE | Admit: 2014-05-21 | Discharge: 2014-05-22 | DRG: 470 | Disposition: A | Discharge status: Home Health | Payer: 59 | Source: Ambulatory Visit | Attending: Orthopedic Surgery | Admitting: Orthopedic Surgery

## 2014-05-21 ENCOUNTER — Encounter (HOSPITAL_COMMUNITY): Payer: Self-pay | Admitting: *Deleted

## 2014-05-21 ENCOUNTER — Inpatient Hospital Stay (HOSPITAL_COMMUNITY): Payer: 59

## 2014-05-21 DIAGNOSIS — F329 Major depressive disorder, single episode, unspecified: Secondary | ICD-10-CM | POA: Diagnosis present

## 2014-05-21 DIAGNOSIS — J449 Chronic obstructive pulmonary disease, unspecified: Secondary | ICD-10-CM | POA: Diagnosis present

## 2014-05-21 DIAGNOSIS — Z96651 Presence of right artificial knee joint: Secondary | ICD-10-CM | POA: Diagnosis present

## 2014-05-21 DIAGNOSIS — K219 Gastro-esophageal reflux disease without esophagitis: Secondary | ICD-10-CM | POA: Diagnosis present

## 2014-05-21 DIAGNOSIS — Z6831 Body mass index (BMI) 31.0-31.9, adult: Secondary | ICD-10-CM | POA: Diagnosis not present

## 2014-05-21 DIAGNOSIS — F419 Anxiety disorder, unspecified: Secondary | ICD-10-CM | POA: Diagnosis present

## 2014-05-21 DIAGNOSIS — Z79899 Other long term (current) drug therapy: Secondary | ICD-10-CM

## 2014-05-21 DIAGNOSIS — I10 Essential (primary) hypertension: Secondary | ICD-10-CM | POA: Diagnosis present

## 2014-05-21 DIAGNOSIS — N400 Enlarged prostate without lower urinary tract symptoms: Secondary | ICD-10-CM | POA: Diagnosis present

## 2014-05-21 DIAGNOSIS — M25551 Pain in right hip: Secondary | ICD-10-CM | POA: Diagnosis present

## 2014-05-21 DIAGNOSIS — E669 Obesity, unspecified: Secondary | ICD-10-CM | POA: Diagnosis present

## 2014-05-21 DIAGNOSIS — M1611 Unilateral primary osteoarthritis, right hip: Principal | ICD-10-CM | POA: Diagnosis present

## 2014-05-21 DIAGNOSIS — Z7982 Long term (current) use of aspirin: Secondary | ICD-10-CM | POA: Diagnosis not present

## 2014-05-21 DIAGNOSIS — E782 Mixed hyperlipidemia: Secondary | ICD-10-CM | POA: Diagnosis present

## 2014-05-21 DIAGNOSIS — Z96649 Presence of unspecified artificial hip joint: Secondary | ICD-10-CM

## 2014-05-21 HISTORY — PX: TOTAL HIP ARTHROPLASTY: SHX124

## 2014-05-21 SURGERY — ARTHROPLASTY, HIP, TOTAL,POSTERIOR APPROACH
Anesthesia: Regional | Site: Hip | Laterality: Right

## 2014-05-21 MED ORDER — ALBUTEROL SULFATE (2.5 MG/3ML) 0.083% IN NEBU: 3.0000 mL | INHALATION_SOLUTION | Freq: Four times a day (QID) | RESPIRATORY_TRACT | Status: DC | PRN

## 2014-05-21 MED ORDER — BUPIVACAINE HCL 0.5 % IJ SOLN
INTRAMUSCULAR | Status: DC | PRN
  Administered 2014-05-21: 10 mL

## 2014-05-21 MED ORDER — METHOCARBAMOL 500 MG PO TABS
500.0000 mg | ORAL_TABLET | Freq: Four times a day (QID) | ORAL | Status: DC
Start: 2014-05-21 — End: 2017-04-19

## 2014-05-21 MED ORDER — DEXAMETHASONE SODIUM PHOSPHATE 10 MG/ML IJ SOLN
INTRAMUSCULAR | Status: AC
  Filled 2014-05-21: qty 1

## 2014-05-21 MED ORDER — ONDANSETRON HCL 4 MG PO TABS: 4.0000 mg | ORAL_TABLET | Freq: Four times a day (QID) | ORAL | Status: DC | PRN

## 2014-05-21 MED ORDER — GLYCOPYRROLATE 0.2 MG/ML IJ SOLN
INTRAMUSCULAR | Status: DC | PRN
  Administered 2014-05-21: .8 mg via INTRAVENOUS

## 2014-05-21 MED ORDER — DOCUSATE SODIUM 100 MG PO CAPS
100.0000 mg | ORAL_CAPSULE | Freq: Two times a day (BID) | ORAL | Status: DC
  Administered 2014-05-21 – 2014-05-22 (×2): 100 mg via ORAL
  Filled 2014-05-21 (×2): qty 1

## 2014-05-21 MED ORDER — 0.9 % SODIUM CHLORIDE (POUR BTL) OPTIME
TOPICAL | Status: DC | PRN
  Administered 2014-05-21: 1000 mL

## 2014-05-21 MED ORDER — ONDANSETRON HCL 4 MG PO TABS: 4.0000 mg | ORAL_TABLET | Freq: Three times a day (TID) | ORAL | Status: DC | PRN

## 2014-05-21 MED ORDER — ROCURONIUM BROMIDE 50 MG/5ML IV SOLN
INTRAVENOUS | Status: AC
  Filled 2014-05-21: qty 1

## 2014-05-21 MED ORDER — METOCLOPRAMIDE HCL 5 MG/ML IJ SOLN: 5.0000 mg | Freq: Three times a day (TID) | INTRAMUSCULAR | Status: DC | PRN

## 2014-05-21 MED ORDER — OLMESARTAN MEDOXOMIL-HCTZ 20-12.5 MG PO TABS: 0.5000 | ORAL_TABLET | Freq: Every day | ORAL | Status: DC

## 2014-05-21 MED ORDER — ROCURONIUM BROMIDE 100 MG/10ML IV SOLN
INTRAVENOUS | Status: DC | PRN
  Administered 2014-05-21: 10 mg via INTRAVENOUS
  Administered 2014-05-21: 50 mg via INTRAVENOUS
  Administered 2014-05-21: 10 mg via INTRAVENOUS

## 2014-05-21 MED ORDER — HYDROMORPHONE HCL 1 MG/ML IJ SOLN: 0.5000 mg | INTRAMUSCULAR | Status: DC | PRN

## 2014-05-21 MED ORDER — PROPOFOL 10 MG/ML IV BOLUS
INTRAVENOUS | Status: AC
  Filled 2014-05-21: qty 20

## 2014-05-21 MED ORDER — DM-GUAIFENESIN ER 30-600 MG PO TB12
1.0000 | ORAL_TABLET | Freq: Two times a day (BID) | ORAL | Status: DC | PRN
  Filled 2014-05-21: qty 1

## 2014-05-21 MED ORDER — DEXAMETHASONE SODIUM PHOSPHATE 10 MG/ML IJ SOLN
INTRAMUSCULAR | Status: DC | PRN
Start: 2014-05-21 — End: 2014-05-21
  Administered 2014-05-21: 10 mg via INTRAVENOUS

## 2014-05-21 MED ORDER — IRBESARTAN 75 MG PO TABS
75.0000 mg | ORAL_TABLET | Freq: Every day | ORAL | Status: DC
Start: 2014-05-21 — End: 2014-05-22
  Administered 2014-05-21 – 2014-05-22 (×2): 75 mg via ORAL
  Filled 2014-05-21 (×2): qty 1

## 2014-05-21 MED ORDER — OXYCODONE-ACETAMINOPHEN 5-325 MG PO TABS: 1.0000 | ORAL_TABLET | ORAL | Status: DC | PRN

## 2014-05-21 MED ORDER — SODIUM CHLORIDE 0.9 % IJ SOLN
INTRAMUSCULAR | Status: DC | PRN
  Administered 2014-05-21: 40 mL via INTRAVENOUS

## 2014-05-21 MED ORDER — KETOROLAC TROMETHAMINE 30 MG/ML IJ SOLN
INTRAMUSCULAR | Status: DC | PRN
  Administered 2014-05-21: 30 mg via INTRAVENOUS

## 2014-05-21 MED ORDER — FENTANYL CITRATE 0.05 MG/ML IJ SOLN
INTRAMUSCULAR | Status: AC
  Filled 2014-05-21: qty 5

## 2014-05-21 MED ORDER — ONDANSETRON HCL 4 MG/2ML IJ SOLN: 4.0000 mg | Freq: Four times a day (QID) | INTRAMUSCULAR | Status: DC | PRN

## 2014-05-21 MED ORDER — PHENOL 1.4 % MT LIQD: 1.0000 | OROMUCOSAL | Status: DC | PRN

## 2014-05-21 MED ORDER — OXYCODONE HCL 5 MG PO TABS
5.0000 mg | ORAL_TABLET | ORAL | Status: DC | PRN
  Administered 2014-05-21 – 2014-05-22 (×3): 10 mg via ORAL
  Filled 2014-05-21 (×3): qty 2

## 2014-05-21 MED ORDER — METOCLOPRAMIDE HCL 10 MG PO TABS
5.0000 mg | ORAL_TABLET | Freq: Three times a day (TID) | ORAL | Status: DC | PRN
Start: 2014-05-21 — End: 2014-05-22

## 2014-05-21 MED ORDER — BUDESONIDE-FORMOTEROL FUMARATE 160-4.5 MCG/ACT IN AERO: 2.0000 | INHALATION_SPRAY | Freq: Two times a day (BID) | RESPIRATORY_TRACT | Status: DC

## 2014-05-21 MED ORDER — ASPIRIN EC 325 MG PO TBEC: 325.0000 mg | DELAYED_RELEASE_TABLET | Freq: Every day | ORAL | Status: DC

## 2014-05-21 MED ORDER — ACETAMINOPHEN 10 MG/ML IV SOLN
INTRAVENOUS | Status: DC | PRN
  Administered 2014-05-21: 1000 mg via INTRAVENOUS

## 2014-05-21 MED ORDER — DUTASTERIDE 0.5 MG PO CAPS
0.5000 mg | ORAL_CAPSULE | Freq: Every day | ORAL | Status: DC
  Administered 2014-05-22: 0.5 mg via ORAL
  Filled 2014-05-21: qty 1

## 2014-05-21 MED ORDER — LACTATED RINGERS IV SOLN
INTRAVENOUS | Status: DC
  Administered 2014-05-21 (×3): via INTRAVENOUS

## 2014-05-21 MED ORDER — GLYCOPYRROLATE 0.2 MG/ML IJ SOLN
INTRAMUSCULAR | Status: AC
  Filled 2014-05-21: qty 4

## 2014-05-21 MED ORDER — EPINEPHRINE 0.3 MG/0.3ML IJ SOAJ: 0.3000 mg | Freq: Once | INTRAMUSCULAR | Status: DC

## 2014-05-21 MED ORDER — CEFAZOLIN SODIUM-DEXTROSE 2-3 GM-% IV SOLR
2.0000 g | Freq: Four times a day (QID) | INTRAVENOUS | Status: AC
  Administered 2014-05-21 (×2): 2 g via INTRAVENOUS
  Filled 2014-05-21 (×2): qty 50

## 2014-05-21 MED ORDER — BUPIVACAINE LIPOSOME 1.3 % IJ SUSP
20.0000 mL | Freq: Once | INTRAMUSCULAR | Status: AC
  Administered 2014-05-21: 20 mL
  Filled 2014-05-21: qty 20

## 2014-05-21 MED ORDER — MEPERIDINE HCL 25 MG/ML IJ SOLN: 6.2500 mg | INTRAMUSCULAR | Status: DC | PRN

## 2014-05-21 MED ORDER — CELECOXIB 200 MG PO CAPS
200.0000 mg | ORAL_CAPSULE | Freq: Two times a day (BID) | ORAL | Status: DC
  Administered 2014-05-21 – 2014-05-22 (×3): 200 mg via ORAL
  Filled 2014-05-21 (×4): qty 1

## 2014-05-21 MED ORDER — METHOCARBAMOL 500 MG PO TABS
500.0000 mg | ORAL_TABLET | Freq: Four times a day (QID) | ORAL | Status: DC | PRN
  Administered 2014-05-21 – 2014-05-22 (×3): 500 mg via ORAL
  Filled 2014-05-21 (×4): qty 1

## 2014-05-21 MED ORDER — ACETAMINOPHEN 325 MG PO TABS: 650.0000 mg | ORAL_TABLET | Freq: Four times a day (QID) | ORAL | Status: DC | PRN

## 2014-05-21 MED ORDER — FENTANYL CITRATE 0.05 MG/ML IJ SOLN
INTRAMUSCULAR | Status: AC
  Filled 2014-05-21: qty 2

## 2014-05-21 MED ORDER — MIDAZOLAM HCL 2 MG/2ML IJ SOLN
INTRAMUSCULAR | Status: AC
  Filled 2014-05-21: qty 2

## 2014-05-21 MED ORDER — MOMETASONE FURO-FORMOTEROL FUM 200-5 MCG/ACT IN AERO
2.0000 | INHALATION_SPRAY | Freq: Two times a day (BID) | RESPIRATORY_TRACT | Status: DC
  Administered 2014-05-21 – 2014-05-22 (×2): 2 via RESPIRATORY_TRACT
  Filled 2014-05-21: qty 8.8

## 2014-05-21 MED ORDER — POTASSIUM CHLORIDE IN NACL 20-0.9 MEQ/L-% IV SOLN
INTRAVENOUS | Status: DC
  Administered 2014-05-21 – 2014-05-22 (×2): via INTRAVENOUS
  Filled 2014-05-21 (×4): qty 1000

## 2014-05-21 MED ORDER — ASPIRIN EC 325 MG PO TBEC
325.0000 mg | DELAYED_RELEASE_TABLET | Freq: Every day | ORAL | Status: DC
  Administered 2014-05-22: 325 mg via ORAL
  Filled 2014-05-21 (×2): qty 1

## 2014-05-21 MED ORDER — BISACODYL 5 MG PO TBEC: 5.0000 mg | DELAYED_RELEASE_TABLET | Freq: Every day | ORAL | Status: DC | PRN

## 2014-05-21 MED ORDER — LIDOCAINE HCL (CARDIAC) 20 MG/ML IV SOLN
INTRAVENOUS | Status: AC
  Filled 2014-05-21: qty 5

## 2014-05-21 MED ORDER — MONTELUKAST SODIUM 10 MG PO TABS
10.0000 mg | ORAL_TABLET | Freq: Every day | ORAL | Status: DC
  Administered 2014-05-21: 10 mg via ORAL
  Filled 2014-05-21 (×2): qty 1

## 2014-05-21 MED ORDER — BUPROPION HCL ER (SR) 150 MG PO TB12
150.0000 mg | ORAL_TABLET | Freq: Two times a day (BID) | ORAL | Status: DC
Start: 2014-05-21 — End: 2014-05-22
  Administered 2014-05-21 – 2014-05-22 (×2): 150 mg via ORAL
  Filled 2014-05-21 (×3): qty 1

## 2014-05-21 MED ORDER — SCOPOLAMINE 1 MG/3DAYS TD PT72
1.0000 | MEDICATED_PATCH | TRANSDERMAL | Status: DC
  Administered 2014-05-21: 1.5 mg via TRANSDERMAL

## 2014-05-21 MED ORDER — HYDROCHLOROTHIAZIDE 10 MG/ML ORAL SUSPENSION
6.2500 mg | Freq: Every day | ORAL | Status: DC
  Administered 2014-05-21 – 2014-05-22 (×2): 6.25 mg via ORAL
  Filled 2014-05-21 (×4): qty 1.25

## 2014-05-21 MED ORDER — DIPHENHYDRAMINE HCL 12.5 MG/5ML PO ELIX: 12.5000 mg | ORAL_SOLUTION | ORAL | Status: DC | PRN

## 2014-05-21 MED ORDER — CHLORHEXIDINE GLUCONATE 4 % EX LIQD: 60.0000 mL | Freq: Once | CUTANEOUS | Status: DC

## 2014-05-21 MED ORDER — ACETAMINOPHEN 10 MG/ML IV SOLN
INTRAVENOUS | Status: AC
  Filled 2014-05-21: qty 100

## 2014-05-21 MED ORDER — FENTANYL CITRATE 0.05 MG/ML IJ SOLN
INTRAMUSCULAR | Status: DC | PRN
  Administered 2014-05-21 (×2): 50 ug via INTRAVENOUS
  Administered 2014-05-21: 100 ug via INTRAVENOUS
  Administered 2014-05-21: 50 ug via INTRAVENOUS

## 2014-05-21 MED ORDER — NEOSTIGMINE METHYLSULFATE 10 MG/10ML IV SOLN
INTRAVENOUS | Status: DC | PRN
Start: 2014-05-21 — End: 2014-05-21
  Administered 2014-05-21: 4 mg via INTRAVENOUS

## 2014-05-21 MED ORDER — MENTHOL 3 MG MT LOZG: 1.0000 | LOZENGE | OROMUCOSAL | Status: DC | PRN

## 2014-05-21 MED ORDER — FAMOTIDINE 20 MG PO TABS
20.0000 mg | ORAL_TABLET | Freq: Every day | ORAL | Status: DC
Start: 2014-05-21 — End: 2014-05-22
  Administered 2014-05-21: 20 mg via ORAL
  Filled 2014-05-21 (×2): qty 1

## 2014-05-21 MED ORDER — DOXAZOSIN MESYLATE 4 MG PO TABS
4.0000 mg | ORAL_TABLET | Freq: Every day | ORAL | Status: DC
  Administered 2014-05-21: 4 mg via ORAL
  Filled 2014-05-21 (×2): qty 1

## 2014-05-21 MED ORDER — LIDOCAINE HCL (CARDIAC) 20 MG/ML IV SOLN
INTRAVENOUS | Status: DC | PRN
  Administered 2014-05-21: 100 mg via INTRAVENOUS

## 2014-05-21 MED ORDER — NEOSTIGMINE METHYLSULFATE 10 MG/10ML IV SOLN
INTRAVENOUS | Status: AC
  Filled 2014-05-21: qty 1

## 2014-05-21 MED ORDER — SCOPOLAMINE 1 MG/3DAYS TD PT72
MEDICATED_PATCH | TRANSDERMAL | Status: AC
  Filled 2014-05-21: qty 1

## 2014-05-21 MED ORDER — FENTANYL CITRATE 0.05 MG/ML IJ SOLN
25.0000 ug | INTRAMUSCULAR | Status: DC | PRN
  Administered 2014-05-21 (×2): 50 ug via INTRAVENOUS

## 2014-05-21 MED ORDER — MIDAZOLAM HCL 5 MG/5ML IJ SOLN
INTRAMUSCULAR | Status: DC | PRN
  Administered 2014-05-21: 2 mg via INTRAVENOUS

## 2014-05-21 MED ORDER — PROPOFOL 10 MG/ML IV BOLUS
INTRAVENOUS | Status: DC | PRN
  Administered 2014-05-21 (×3): 10 mg via INTRAVENOUS
  Administered 2014-05-21: 150 mg via INTRAVENOUS
  Administered 2014-05-21 (×2): 10 mg via INTRAVENOUS

## 2014-05-21 MED ORDER — PROMETHAZINE HCL 25 MG/ML IJ SOLN: 6.2500 mg | INTRAMUSCULAR | Status: DC | PRN

## 2014-05-21 MED ORDER — ACETAMINOPHEN 650 MG RE SUPP: 650.0000 mg | Freq: Four times a day (QID) | RECTAL | Status: DC | PRN

## 2014-05-21 MED ORDER — LORATADINE 10 MG PO TABS
10.0000 mg | ORAL_TABLET | Freq: Every day | ORAL | Status: DC
  Administered 2014-05-22: 10 mg via ORAL
  Filled 2014-05-21: qty 1

## 2014-05-21 MED ORDER — PANTOPRAZOLE SODIUM 40 MG PO TBEC
40.0000 mg | DELAYED_RELEASE_TABLET | Freq: Every day | ORAL | Status: DC
  Administered 2014-05-22: 40 mg via ORAL
  Filled 2014-05-21: qty 1

## 2014-05-21 MED ORDER — KETOROLAC TROMETHAMINE 30 MG/ML IJ SOLN
INTRAMUSCULAR | Status: AC
  Filled 2014-05-21: qty 1

## 2014-05-21 MED ORDER — ONDANSETRON HCL 4 MG/2ML IJ SOLN
INTRAMUSCULAR | Status: DC | PRN
  Administered 2014-05-21: 4 mg via INTRAVENOUS

## 2014-05-21 MED ORDER — SIMVASTATIN 40 MG PO TABS
40.0000 mg | ORAL_TABLET | Freq: Every day | ORAL | Status: DC
  Administered 2014-05-21: 40 mg via ORAL
  Filled 2014-05-21 (×2): qty 1

## 2014-05-21 MED ORDER — METHOCARBAMOL 1000 MG/10ML IJ SOLN
500.0000 mg | Freq: Four times a day (QID) | INTRAMUSCULAR | Status: DC | PRN
  Filled 2014-05-21: qty 5

## 2014-05-21 MED ORDER — DEXMEDETOMIDINE BOLUS VIA INFUSION
INTRAVENOUS | Status: DC | PRN
  Administered 2014-05-21 (×5): 10 ug via INTRAVENOUS

## 2014-05-21 MED ORDER — ONDANSETRON HCL 4 MG/2ML IJ SOLN
INTRAMUSCULAR | Status: AC
  Filled 2014-05-21: qty 2

## 2014-05-21 SURGICAL SUPPLY — 62 items
BENZOIN TINCTURE PRP APPL 2/3 (GAUZE/BANDAGES/DRESSINGS) ×3 IMPLANT
BLADE SAW SAG 73X25 THK (BLADE) ×2
BLADE SAW SGTL 73X25 THK (BLADE) ×1 IMPLANT
BNDG COHESIVE 4X5 TAN STRL (GAUZE/BANDAGES/DRESSINGS) ×3 IMPLANT
CLOSURE STERI-STRIP 1/2X4 (GAUZE/BANDAGES/DRESSINGS) ×1
CLOSURE WOUND 1/2 X4 (GAUZE/BANDAGES/DRESSINGS) ×1
CLSR STERI-STRIP ANTIMIC 1/2X4 (GAUZE/BANDAGES/DRESSINGS) ×2 IMPLANT
COVER SURGICAL LIGHT HANDLE (MISCELLANEOUS) ×3 IMPLANT
DRAPE IMP U-DRAPE 54X76 (DRAPES) ×3 IMPLANT
DRAPE INCISE IOBAN 66X45 STRL (DRAPES) IMPLANT
DRAPE ORTHO SPLIT 77X108 STRL (DRAPES) ×4
DRAPE SURG ORHT 6 SPLT 77X108 (DRAPES) ×2 IMPLANT
DRAPE U-SHAPE 47X51 STRL (DRAPES) ×3 IMPLANT
DRILL BIT 7/64X5 (BIT) ×3 IMPLANT
DRSG MEPILEX BORDER 4X12 (GAUZE/BANDAGES/DRESSINGS) ×3 IMPLANT
DRSG MEPILEX BORDER 4X4 (GAUZE/BANDAGES/DRESSINGS) ×3 IMPLANT
DRSG MEPILEX BORDER 4X8 (GAUZE/BANDAGES/DRESSINGS) ×3 IMPLANT
DURAPREP 26ML APPLICATOR (WOUND CARE) ×3 IMPLANT
ELECT BLADE 6.5 EXT (BLADE) IMPLANT
ELECT CAUTERY BLADE 6.4 (BLADE) ×3 IMPLANT
ELECT REM PT RETURN 9FT ADLT (ELECTROSURGICAL) ×3
ELECTRODE REM PT RTRN 9FT ADLT (ELECTROSURGICAL) ×1 IMPLANT
EVACUATOR 1/8 PVC DRAIN (DRAIN) IMPLANT
FACESHIELD WRAPAROUND (MASK) ×3 IMPLANT
GLOVE BIO SURGEON STRL SZ 6.5 (GLOVE) ×2 IMPLANT
GLOVE BIO SURGEONS STRL SZ 6.5 (GLOVE) ×1
GLOVE BIOGEL PI IND STRL 6 (GLOVE) ×2 IMPLANT
GLOVE BIOGEL PI IND STRL 6.5 (GLOVE) ×1 IMPLANT
GLOVE BIOGEL PI IND STRL 7.0 (GLOVE) ×3 IMPLANT
GLOVE BIOGEL PI INDICATOR 6 (GLOVE) ×4
GLOVE BIOGEL PI INDICATOR 6.5 (GLOVE) ×2
GLOVE BIOGEL PI INDICATOR 7.0 (GLOVE) ×6
GLOVE ECLIPSE 6.5 STRL STRAW (GLOVE) ×6 IMPLANT
GLOVE ORTHO TXT STRL SZ7.5 (GLOVE) ×6 IMPLANT
GOWN STRL REUS W/ TWL XL LVL3 (GOWN DISPOSABLE) ×2 IMPLANT
GOWN STRL REUS W/TWL XL LVL3 (GOWN DISPOSABLE) ×4
HIP/CERM HD VIT E LINR LEV 1C ×3 IMPLANT
HOOD SURGICAL BLUE (PROTECTIVE WEAR) ×3 IMPLANT
KIT BASIN OR (CUSTOM PROCEDURE TRAY) ×3 IMPLANT
KIT ROOM TURNOVER OR (KITS) ×3 IMPLANT
MANIFOLD NEPTUNE II (INSTRUMENTS) ×3 IMPLANT
NEEDLE 18GX1X1/2 (RX/OR ONLY) (NEEDLE) ×3 IMPLANT
NS IRRIG 1000ML POUR BTL (IV SOLUTION) ×3 IMPLANT
PACK TOTAL JOINT (CUSTOM PROCEDURE TRAY) ×3 IMPLANT
PACK UNIVERSAL I (CUSTOM PROCEDURE TRAY) ×3 IMPLANT
PAD ARMBOARD 7.5X6 YLW CONV (MISCELLANEOUS) ×6 IMPLANT
PILLOW ABDUCTION HIP (SOFTGOODS) ×3 IMPLANT
RETRIEVER SUT HEWSON (MISCELLANEOUS) ×6 IMPLANT
STRIP CLOSURE SKIN 1/2X4 (GAUZE/BANDAGES/DRESSINGS) ×2 IMPLANT
SUCTION FRAZIER TIP 10 FR DISP (SUCTIONS) ×3 IMPLANT
SUT FIBERWIRE #2 38 REV NDL BL (SUTURE) ×9
SUT MNCRL AB 4-0 PS2 18 (SUTURE) ×3 IMPLANT
SUT MON AB 2-0 CT1 36 (SUTURE) ×3 IMPLANT
SUT VIC AB 1 CTX 36 (SUTURE) ×8
SUT VIC AB 1 CTX36XBRD ANBCTR (SUTURE) ×4 IMPLANT
SUT VIC AB 2-0 CT1 27 (SUTURE)
SUT VIC AB 2-0 CT1 TAPERPNT 27 (SUTURE) IMPLANT
SUTURE FIBERWR#2 38 REV NDL BL (SUTURE) ×3 IMPLANT
SYR 50ML LL SCALE MARK (SYRINGE) ×3 IMPLANT
TOWEL OR 17X24 6PK STRL BLUE (TOWEL DISPOSABLE) ×3 IMPLANT
TOWEL OR 17X26 10 PK STRL BLUE (TOWEL DISPOSABLE) ×3 IMPLANT
WATER STERILE IRR 1000ML POUR (IV SOLUTION) IMPLANT

## 2014-05-21 NOTE — Interval H&P Note (Signed)
History and Physical Interval Note:  05/21/2014 8:31 AM  Tony Douglas  has presented today for surgery, with the diagnosis of djd right hip  The various methods of treatment have been discussed with the patient and family. After consideration of risks, benefits and other options for treatment, the patient has consented to  Procedure(s): RIGHT TOTAL HIP ARTHROPLASTY (Right) as a surgical intervention .  The patient's history has been reviewed, patient examined, no change in status, stable for surgery.  I have reviewed the patient's chart and labs.  Questions were answered to the patient's satisfaction.     [name] F

## 2014-05-21 NOTE — Progress Notes (Signed)
Utilization review completed.  

## 2014-05-21 NOTE — Anesthesia Postprocedure Evaluation (Signed)
  Anesthesia Post-op Note  Patient: Tony Douglas  Procedure(s) Performed: Procedure(s): RIGHT TOTAL HIP ARTHROPLASTY (Right)  Patient Location: PACU  Anesthesia Type:General  Level of Consciousness: awake, alert  and oriented  Airway and Oxygen Therapy: Patient Spontanous Breathing and Patient connected to nasal cannula oxygen  Post-op Pain: mild  Post-op Assessment: Post-op Vital signs reviewed and Respiratory Function Stable  Post-op Vital Signs: Reviewed and stable  Last Vitals:  Filed Vitals:   05/21/14 1123  BP: 117/67  Pulse: 83  Temp:   Resp: 16    Complications: No apparent anesthesia complications

## 2014-05-21 NOTE — Plan of Care (Signed)
Problem: Consults Goal: Nutrition Consult-if indicated Outcome: Not Applicable Date Met:  38/18/29 Goal: Diabetes Guidelines if Diabetic/Glucose > 140 If diabetic or lab glucose is > 140 mg/dl - Initiate Diabetes/Hyperglycemia Guidelines & Document Interventions  Outcome: Not Applicable Date Met:  93/71/69  Problem: Phase I Progression Outcomes Goal: CMS/Neurovascular status WDL Outcome: Progressing

## 2014-05-21 NOTE — Evaluation (Signed)
Physical Therapy Evaluation Patient Details Name: Tony Douglas MRN: [id number] DOB: 11-02-1950 Today's Date: 05/21/2014   History of Present Illness  Patient is a 63 y/o male s/p R THA. WBAT RLE.  Clinical Impression  Patient presents with post surgical deficits RLE secondary to above surgery. Pt with generalized weakness, lightheadedness and pain limiting safe mobility. Pain is biggest limiting factor during today's session. Tolerated short distance ambulation. Pt would benefit from acute PT to improve transfers, gait, balance and mobility so pt can maximize independence and return to PLOF.    Follow Up Recommendations Home health PT;Supervision for mobility/OOB    Equipment Recommendations  None recommended by PT    Recommendations for Other Services       Precautions / Restrictions Precautions Precautions: Fall;Anterior Hip Precaution Booklet Issued: Yes (comment) Precaution Comments: Direct anterior approach - no hip precautions. Reviewed HEP and gave handout. Restrictions Weight Bearing Restrictions: Yes RLE Weight Bearing: Weight bearing as tolerated      Mobility  Bed Mobility Overal bed mobility: Needs Assistance Bed Mobility: Supine to Sit     Supine to sit: Supervision;HOB elevated     General bed mobility comments: Use of rails for support. Increased time due to pain.  Transfers Overall transfer level: Needs assistance Equipment used: Rolling walker (2 wheeled) Transfers: Sit to/from Stand Sit to Stand: Min guard         General transfer comment: Min guard for safety/balance. VC's for hand placement.  Ambulation/Gait Ambulation/Gait assistance: Min guard Ambulation Distance (Feet): 8 Feet Assistive device: Rolling walker (2 wheeled) Gait Pattern/deviations: Step-to pattern;Decreased stride length;Decreased stance time - right;Decreased step length - left;Trunk flexed Gait velocity: slow   General Gait Details: Slow, steady gait with mild  unsteadiness RLE during weightbearing. (+) light headed initially - resolved.  Stairs            Wheelchair Mobility    Modified Rankin (Stroke Patients Only)       Balance Overall balance assessment: Needs assistance Sitting-balance support: Feet supported;No upper extremity supported Sitting balance-Leahy Scale: Good     Standing balance support: During functional activity;Bilateral upper extremity supported   Standing balance comment: Requires BUE support on RW during dynamic standing however tolerated short periods of static standing without UE support.                             Pertinent Vitals/Pain Pain Assessment: 0-10 Pain Score: 6  (increased to 7-8 with mobility.) Pain Location: right hip Pain Descriptors / Indicators: Sore;Aching Pain Intervention(s): Limited activity within patient's tolerance;Monitored during session;Repositioned;Patient requesting pain meds-RN notified    Home Living Family/patient expects to be discharged to:: Private residence Living Arrangements: Spouse/significant other Available Help at Discharge: Family;Available 24 hours/day Type of Home: House Home Access: Level entry     Home Layout: One level Home Equipment: Walker - 2 wheels;Bedside commode      Prior Function Level of Independence: Independent               Hand Dominance        Extremity/Trunk Assessment   Upper Extremity Assessment: Defer to OT evaluation;Overall [facility] for tasks assessed           Lower Extremity Assessment: Generalized weakness;RLE deficits/detail RLE Deficits / Details: Limited AROM hip flexion. Knee extension/flexion and ankle AROM WFL.     Cervical / Trunk Assessment: Normal  Communication   Communication: No difficulties  Cognition Arousal/Alertness: Awake/alert  Behavior During Therapy: WFL for tasks assessed/performed Overall Cognitive Status: Within Functional Limits for tasks assessed                       General Comments General comments (skin integrity, edema, etc.): Edema present RLE.    Exercises Total Joint Exercises Ankle Circles/Pumps: Both;10 reps;Seated Quad Sets: Both;10 reps;Seated (3-5 sec hold) Gluteal Sets: Both;5 reps;Seated      Assessment/Plan    PT Assessment Patient needs continued PT services  PT Diagnosis Acute pain;Generalized weakness;Difficulty walking   PT Problem List Decreased strength;Pain;Decreased activity tolerance;Decreased balance;Decreased mobility  PT Treatment Interventions Balance training;Gait training;Patient/family education;Functional mobility training;Therapeutic activities;Therapeutic exercise;Stair training   PT Goals (Current goals can be found in the Care Plan section) Acute Rehab PT Goals Patient Stated Goal: to get moving  PT Goal Formulation: With patient Time For Goal Achievement: 06/04/14 Potential to Achieve Goals: Good    Frequency 7X/week   Barriers to discharge        Co-evaluation               End of Session Equipment Utilized During Treatment: Gait belt Activity Tolerance: Patient tolerated treatment well;Patient limited by pain Patient left: in chair;with call bell/phone within reach;with family/visitor present (Informed pt to ask for assist when ready to go to bathroom or back to bed. Pt agreeable.)           Time: 2924-4628 PT Time Calculation (min) (ACUTE ONLY): 19 min   Charges:   PT Evaluation $Initial PT Evaluation Tier I: 1 Procedure PT Treatments $Therapeutic Activity: 8-22 mins   PT G CodesCandy Sledge A 05/21/2014, 4:02 PM  Candy Sledge, [place], DPT [phone]

## 2014-05-21 NOTE — Op Note (Signed)
NAMELEXTON, Tony Douglas NO.:  [id number]  MEDICAL RECORD NO.:  [id number]  LOCATION:  5N32C                        FACILITY:  [facility]  PHYSICIAN:  Tony Douglas, M.D. DATE OF BIRTH:  Nov 18, 1950  DATE OF PROCEDURE:  05/21/2014 DATE OF DISCHARGE:                              OPERATIVE REPORT   PREOPERATIVE DIAGNOSIS:  Generalized primary osteoarthritis, most marked right hip.  POSTOPERATIVE DIAGNOSIS:  Generalized primary osteoarthritis, most marked right hip.  PROCEDURE:  Direct anterior right total hip replacement utilizing Stryker prosthesis.  A press-fit 56 mm acetabular component screw fixation x2.  A 36-mm internal diameter polyethylene liner.  A press-fit #6 Achilles stem.  A 36+ 0 BioLock hip.  SURGEON:  Tony Douglas, M.D.  ASSISTANT:  Doran Stabler, PA-C, present throughout the entire case and necessary for timely completion of procedure.  ANESTHESIA:  General.  BLOOD LOSS:  200 mL.  BLOOD GIVEN:  None.  SPECIMENS:  None.  CULTURES:  None.  COMPLICATIONS:  None.  DRESSINGS:  Soft compressive.  DESCRIPTION OF PROCEDURE:  The patient was brought to the operating room, placed on the operating table in supine position.  After adequate anesthesia had been obtained, appropriate positioning, prepped and draped for direct anterior approach.  Tranexamic acid utilized to the procedure.  Longitudinal incision posterior to the anterior-superior iliac spine extending to distal posterior.  Skin and subcutaneous tissue divided.  Hemostasis with electrocautery.  Fascia over the tensor incised.  Muscle retracted anteriorly.  The front of the capsule of soft tissue was all completely debrided and retractors put in place.  Tibia exposed.  Femoral neck was cut 1 fingerbreadth above the lesser trochanter, a Napkin ring and in the femoral head.  They were all extracted.  Exposing the acetabulum.  Grade 4 changes.  Redundant labrum debrided.  Appropriate  retractors.  The acetabulum brought up to good bleeding bone.  Sufficient medial and inferior placement.  Sized and fitted with a 56 mm component, hammered in at 40 degrees of abduction and minimal anteversion.  Screw fixation x2.  Internal diameter 36 mm liner inserted.  Femur exposed.  Freed up to allow access.  Broaches used to obtain appropriate fitting and trials.  A #6 component was seated.  A 127 degree neck angle.  A 36+ 0 BioLock.  With this at equal leg lengths stable ends nicely reduced. Wound irrigated.  Soft tissues injected with Exparel.  Fascia closed with Vicryl.  Subcutaneous and subcuticular closure margins were injected with Marcaine.  Sterile compressive dressing applied. Anesthesia reversed.  Brought to the recovery room.  Tolerated the surgery well.  No complications.     Tony Douglas, M.D.     DFM/MEDQ  D:  05/21/2014  T:  05/21/2014  Job:  [id number]

## 2014-05-21 NOTE — Discharge Summary (Addendum)
Patient ID: Tony Douglas MRN: [id number] DOB/AGE: 63-Nov-1952 63 y.o.  Admit date: 05/21/2014 Discharge date: 05/22/2014  Admission Diagnoses:  Active Problems:   Osteoarthritis of right hip   Discharge Diagnoses:  Same  Past Medical History  Diagnosis Date  . Obesity   . Osteoarthritis   . COPD (chronic obstructive pulmonary disease)   . GERD (gastroesophageal reflux disease)   . Diverticular disease   . Hyperlipidemia, mixed   . Hypertension   . Allergic rhinitis   . Esophageal reflux   . BPH (benign prostatic hyperplasia)   . Anxiety   . Complication of anesthesia   . PONV (postoperative nausea and vomiting)   . Constipation     Surgeries: Procedure(s): RIGHT TOTAL HIP ARTHROPLASTY on 05/21/2014   Consultants:    Discharged Condition: Improved  Hospital Course: [name] is an 63 y.o. male who was admitted 05/21/2014 for operative treatment of primary osteoarthritis right hip. Patient has severe unremitting pain that affects sleep, daily activities, and work/hobbies. After pre-op clearance the patient was taken to the operating room on 05/21/2014 and underwent  Procedure(s): RIGHT TOTAL HIP ARTHROPLASTY.  Patient with a pre-op hb of 14.0 developed ABLA on pod#1 with a hb of 11.1.  He is currently stable but we will continue to follow.  Patient was given perioperative antibiotics:      Anti-infectives    Start     Dose/Rate Route Frequency Ordered Stop   05/21/14 1500  ceFAZolin (ANCEF) IVPB 2 g/50 mL premix     2 g100 mL/hr over 30 Minutes Intravenous Every 6 hours 05/21/14 1213 05/21/14 2300   05/21/14 0600  ceFAZolin (ANCEF) IVPB 2 g/50 mL premix     2 g100 mL/hr over 30 Minutes Intravenous On call to O.R. 05/20/14 1343 05/21/14 0850       Patient was given sequential compression devices, early ambulation, and chemoprophylaxis to prevent DVT.  Patient benefited maximally from hospital stay and there were no complications.    Recent vital signs:   Patient Vitals for the past 24 hrs:  BP Temp Pulse Resp SpO2  05/22/14 0541 105/63 mmHg 97.8 F (36.6 C) 83 16 96 %  05/22/14 0149 (!) 103/49 mmHg 97.6 F (36.4 C) (!) 105 16 96 %  05/22/14 0000 - - - 16 93 %  05/21/14 2056 114/71 mmHg 98.7 F (37.1 C) 100 16 93 %  05/21/14 2033 - - 80 16 -  05/21/14 2000 - - - 18 94 %  05/21/14 1600 - - - 16 97 %  05/21/14 1222 126/74 mmHg 98 F (36.7 C) 85 - 97 %  05/21/14 1208 121/71 mmHg 97.6 F (36.4 C) 88 17 97 %  05/21/14 1200 110/72 mmHg - 86 16 93 %  05/21/14 1145 116/64 mmHg - 80 17 97 %  05/21/14 1130 - - 83 17 94 %  05/21/14 1123 117/67 mmHg - 83 16 97 %  05/21/14 1115 - - 82 20 95 %  05/21/14 1108 121/71 mmHg 97.8 F (36.6 C) 89 13 94 %     Recent laboratory studies:   Recent Labs  05/22/14 0605  WBC 10.4  HGB 11.1*  HCT 32.6*  PLT 190  NA 136*  K 4.3  CL 100  CO2 21  BUN 17  CREATININE 0.96  GLUCOSE 114*  CALCIUM 8.9     Discharge Medications:     Medication List    STOP taking these medications  HYDROcodone-acetaminophen 5-325 MG per tablet  Commonly known as:  NORCO/VICODIN     ibuprofen 200 MG tablet  Commonly known as:  ADVIL,MOTRIN      TAKE these medications        aspirin EC 325 MG tablet  Take 1 tablet (325 mg total) by mouth daily.     budesonide-formoterol 80-4.5 MCG/ACT inhaler  Commonly known as:  SYMBICORT  Inhale 2 puffs into the lungs 2 (two) times daily.     budesonide-formoterol 160-4.5 MCG/ACT inhaler  Commonly known as:  SYMBICORT  Take 2 puffs first thing in am and then another 2 puffs about 12 hours later.     buPROPion 150 MG 12 hr tablet  Commonly known as:  WELLBUTRIN SR  Take 150 mg by mouth 2 (two) times daily.     cetirizine 10 MG tablet  Commonly known as:  ZYRTEC  Take 10 mg by mouth daily.     dextromethorphan-guaiFENesin 30-600 MG per 12 hr tablet  Commonly known as:  MUCINEX DM  Take 1 tablet by mouth 2 (two) times daily as needed for cough.      doxazosin 4 MG tablet  Commonly known as:  CARDURA  Take 4 mg by mouth at bedtime.     dutasteride 0.5 MG capsule  Commonly known as:  AVODART  Take 0.5 mg by mouth daily.     EPIPEN 2-PAK 0.3 mg/0.3 mL Soaj injection  Generic drug:  EPINEPHrine  Inject 0.3 mg into the muscle once.     famotidine 20 MG tablet  Commonly known as:  PEPCID  One at bedtime     ketoconazole 2 % cream  Commonly known as:  NIZORAL     methocarbamol 500 MG tablet  Commonly known as:  ROBAXIN  Take 1 tablet (500 mg total) by mouth 4 (four) times daily.     mometasone-formoterol 200-5 MCG/ACT Aero  Commonly known as:  DULERA  Inhale 2 puffs into the lungs 2 (two) times daily.     montelukast 10 MG tablet  Commonly known as:  SINGULAIR  Take 1 tablet (10 mg total) by mouth at bedtime.     olmesartan-hydrochlorothiazide 20-12.5 MG per tablet  Commonly known as:  BENICAR HCT  Take 1 tablet by mouth daily.     omeprazole 20 MG capsule  Commonly known as:  PRILOSEC  Take 20 mg by mouth daily. Take 30- 60 min before your first meal of the day     ondansetron 4 MG tablet  Commonly known as:  ZOFRAN  Take 1 tablet (4 mg total) by mouth every 8 (eight) hours as needed for nausea or vomiting.     oxyCODONE-acetaminophen 5-325 MG per tablet  Commonly known as:  ROXICET  Take 1-2 tablets by mouth every 4 (four) hours as needed.     PHILLIPS STOOL SOFTENER PO  Take 1 capsule by mouth daily as needed (constipation).     docusate sodium 100 MG capsule  Commonly known as:  COLACE  Take 100 mg by mouth 2 (two) times daily.     polyethylene glycol packet  Commonly known as:  MIRALAX / GLYCOLAX  Take 17 g by mouth daily as needed for mild constipation or moderate constipation.     simvastatin 40 MG tablet  Commonly known as:  ZOCOR  Take 40 mg by mouth daily.     VENTOLIN HFA 108 (90 BASE) MCG/ACT inhaler  Generic drug:  albuterol  Inhale 2 puffs into the lungs  every 6 (six) hours as needed.      Vitamin D 2000 UNITS Caps  Take 2,000 Units by mouth daily.        Diagnostic Studies: Dg Hip Portable 1 View Right  05/21/2014   CLINICAL DATA:  Postop imaging from right hip arthroplasty.  EXAM: PORTABLE RIGHT HIP - 1 VIEW  COMPARISON:  None.  FINDINGS: Single view shows the femoral and acetabular components of the right hip prosthesis to be well seated and aligned. There is no acute fracture or evidence of an operative complication.  IMPRESSION: Well-aligned right hip prosthesis.   Electronically Signed   By: Lajean Manes M.D.   On: 05/21/2014 12:13    Disposition:   Discharge Instructions    Call MD / Call 911    Complete by:  As directed   If you experience chest pain or shortness of breath, CALL 911 and be transported to the hospital emergency room.  If you develope a fever above 101 F, pus (white drainage) or increased drainage or redness at the wound, or calf pain, call your surgeon's office.     Change dressing    Complete by:  As directed   Change the dressing daily with sterile 4 x 4 inch gauze dressing and paper tape.  You may clean the incision with alcohol prior to redressing     Constipation Prevention    Complete by:  As directed   Drink plenty of fluids.  Prune juice may be helpful.  You may use a stool softener, such as Colace (over the counter) 100 mg twice a day.  Use MiraLax (over the counter) for constipation as needed.     Diet - low sodium heart healthy    Complete by:  As directed      Discharge instructions    Complete by:  As directed   Weight bearing as tolerated.  Take Aspirin 1 tab a day for the next 30 days to prevent blood clots.  Change dressing daily starting on Saturday.  May shower on Monday but do not soak incision. May apply ice for up to 20 minutes at a time for pain and swelling.  Follow up appointment in two weeks.     Do not put a pillow under the knee. Place it under the heel.    Complete by:  As directed   Place gray foam under operative heel  when in bed or in a chair to work on extension     Follow the hip precautions as taught in Physical Therapy    Complete by:  As directed   Posterior total hip precautions.  Weight bearing as tolerated     Increase activity slowly as tolerated    Complete by:  As directed      TED hose    Complete by:  As directed   Use stockings (TED hose) for 2-3 weeks on both leg(s).  You may remove them at night for sleeping.           Follow-up Information    Follow up with [facility], MD. Schedule an appointment as soon as possible for a visit in 2 weeks.   Specialty:  Orthopedic Surgery   Contact information:   [place] [phone]        Signed: Larae Grooms 05/22/2014, 10:39 AM

## 2014-05-21 NOTE — H&P (View-Only) (Signed)
TOTAL HIP ADMISSION H&P  Patient is admitted for right total hip arthroplasty.  Subjective:  Chief Complaint: right hip pain  HPI: GARREN GREENMAN, 63 y.o. male, has a history of pain and functional disability in the right hip(s) due to arthritis and patient has failed non-surgical conservative treatments for greater than 12 weeks to include NSAID's and/or analgesics and corticosteriod injections.  Onset of symptoms was gradual starting 1 years ago with rapidlly worsening course since that time.The patient noted no past surgery on the right hip(s).  Patient currently rates pain in the right hip at 6 out of 10 with activity. Patient has night pain, worsening of pain with activity and weight bearing and trendelenberg gait. Patient has evidence of subchondral cysts, subchondral sclerosis and joint space narrowing by imaging studies. This condition presents safety issues increasing the risk of falls.  There is no current active infection.  Patient Active Problem List   Diagnosis Date Noted  . Cough 11/14/2011  . Hypertension 11/14/2011  . Asthma, extrinsic 11/14/2011   Past Medical History  Diagnosis Date  . Obesity   . Osteoarthritis   . COPD (chronic obstructive pulmonary disease)   . Depression   . GERD (gastroesophageal reflux disease)   . Diverticular disease   . Hyperlipidemia, mixed   . Hypertension   . Allergic rhinitis   . Esophageal reflux   . BPH (benign prostatic hyperplasia)   . Anxiety     Past Surgical History  Procedure Laterality Date  . Total knee arthroplasty  2011    right  . Shoulder surgery  2008    right  . Appendectomy    . Colectomy  2004    laproscopic sigmoid colectomy     (Not in a hospital admission) No Known Allergies  History  Substance Use Topics  . Smoking status: Former Smoker -- 1.00 packs/day for 30 years    Types: Cigarettes    Quit date: 07/04/1992  . Smokeless tobacco: Never Used  . Alcohol Use: Yes     Comment: 1 or 2 beers per  month    Family History  Problem Relation Age of Onset  . Emphysema Father     smoked and worked in [employer]  . Allergies Brother   . Asthma Brother   . Heart disease Mother      Review of Systems  Constitutional: Negative.   HENT: Negative.   Respiratory: Negative.   Cardiovascular: Negative.   Gastrointestinal: Negative.   Genitourinary: Negative.   Musculoskeletal: Positive for back pain and joint pain.  Skin: Negative.   Neurological: Negative.   Endo/Heme/Allergies: Negative.   Psychiatric/Behavioral: Negative.     Objective:  Physical Exam  Constitutional: He is oriented to person, place, and time. He appears well-developed and well-nourished.  HENT:  Head: Normocephalic and atraumatic.  Eyes: EOM are normal. Pupils are equal, round, and reactive to light.  Neck: Normal range of motion. Neck supple.  Cardiovascular: Normal rate, regular rhythm and normal heart sounds.  Exam reveals no gallop and no friction rub.   No murmur heard. Respiratory: Effort normal and breath sounds normal. No respiratory distress. He has no wheezes. He has no rales.  GI: Soft. Bowel sounds are normal.  Musculoskeletal:  Examination of his right hip reveals positive straight leg raise, positive log roll, positive Stinchfield's on the right.  Negative on the left.  He is neurovascularly intact distally.  Neurological: He is alert and oriented to person, place, and time.  Skin: Skin is  warm and dry.  Psychiatric: He has a normal mood and affect. His behavior is normal. Judgment and thought content normal.    Vital signs in last 24 hours: @VSRANGES @  Labs:   Estimated body mass index is 32.57 kg/(m^2) as calculated from the following:   Height as of 04/03/13: 5\' 10"  (1.778 m).   Weight as of 04/03/13: 102.967 kg (227 lb).   Imaging Review Plain radiographs demonstrate severe degenerative joint disease of the right hip(s). The bone quality appears to be fair for age and reported activity  level.  Assessment/Plan:  End stage arthritis, right hip(s)  The patient history, physical examination, clinical judgement of the provider and imaging studies are consistent with end stage degenerative joint disease of the right hip(s) and total hip arthroplasty is deemed medically necessary. The treatment options including medical management, injection therapy, arthroscopy and arthroplasty were discussed at length. The risks and benefits of total hip arthroplasty were presented and reviewed. The risks due to aseptic loosening, infection, stiffness, dislocation/subluxation,  thromboembolic complications and other imponderables were discussed.  The patient acknowledged the explanation, agreed to proceed with the plan and consent was signed. Patient is being admitted for inpatient treatment for surgery, pain control, PT, OT, prophylactic antibiotics, VTE prophylaxis, progressive ambulation and ADL's and discharge planning.The patient is planning to be discharged home with home health services

## 2014-05-21 NOTE — Evaluation (Signed)
Occupational Therapy Evaluation Patient Details Name: Tony Douglas MRN: [id number] DOB: May 06, 1951 Today's Date: 05/21/2014    History of Present Illness Patient is a 63 y/o male s/p R THA. WBAT RLE.   Clinical Impression   Pt s/p above. Pt moving well. Education provided to pt and spouse. No further OT needs.     Follow Up Recommendations  No OT follow up;Supervision - Intermittent    Equipment Recommendations  None recommended by OT    Recommendations for Other Services       Precautions / Restrictions Precautions Precautions: Fall;Anterior Hip (no hip precautions) Precaution Booklet Issued: No Precaution Comments: no hip precautions Restrictions Weight Bearing Restrictions: Yes RLE Weight Bearing: Weight bearing as tolerated      Mobility Bed Mobility     General bed mobility comments: not assessed  Transfers Overall transfer level: Needs assistance Equipment used: Rolling walker (2 wheeled) Transfers: Sit to/from Stand Sit to Stand: Min guard;Min assist         General transfer comment: Min guard for safety; Min A for stand to sit to toilet.         ADL Overall ADL's : Needs assistance/impaired     Grooming: Wash/dry hands;Wash/dry face;Oral care;Set up;Supervision/safety;Standing               Lower Body Dressing: Min guard;Sit to/from stand   Toilet Transfer: Min guard;Ambulation;RW;Comfort height toilet;Grab bars (Min A for stand to sit to toilet)   Toileting- Clothing Manipulation and Hygiene: Minimal assistance;Sit to/from stand   Tub/ Shower Transfer: Min guard;Ambulation;Rolling walker   Functional mobility during ADLs: Min guard;Rolling walker (cues for technique) General ADL Comments: Educated on LB dressing technique. Educated on safety (safe shoewear, use of bag on walker, sitting for most of LB ADLs, rugs, clutter). Explained use of reacher. Educated that AE is available for LB ADLs if it becomes an issue.  Explained benefit of  elevating RUE and also said ice may help. practiced simulated shower transfer and educated on different techniques. Recommended spouse being with him for shower transfer and for bathing.      Vision                     Perception     Praxis      Pertinent Vitals/Pain Pain Assessment: 0-10 Pain Score: 5  Pain Location: right hip Pain Descriptors / Indicators: Sore;Aching Pain Intervention(s): Monitored during session;Repositioned     Hand Dominance     Extremity/Trunk Assessment Upper Extremity Assessment Upper Extremity Assessment: Overall WFL for tasks assessed   Lower Extremity Assessment Lower Extremity Assessment: Defer to PT evaluation RLE Deficits / Details: Limited AROM hip flexion. Knee extension/flexion and ankle AROM WFL.    Cervical / Trunk Assessment Cervical / Trunk Assessment: Normal   Communication Communication Communication: No difficulties   Cognition Arousal/Alertness: Awake/alert Behavior During Therapy: WFL for tasks assessed/performed Overall Cognitive Status: Within Functional Limits for tasks assessed                     General Comments       Exercises       Shoulder Instructions      Home Living Family/patient expects to be discharged to:: Private residence Living Arrangements: Spouse/significant other Available Help at Discharge: Family;Available 24 hours/day Type of Home: House Home Access: Level entry     Home Layout: One level     Bathroom Shower/Tub: Occupational psychologist: Handicapped height  Home Equipment: [place] - 2 wheels;Shower seat;Grab bars - toilet;Grab bars - tub/shower;Hand held shower head          Prior Functioning/Environment Level of Independence: Independent             OT Diagnosis: Acute pain   OT Problem List:     OT Treatment/Interventions:      OT Goals(Current goals can be found in the care plan section) Acute Rehab OT Goals Patient Stated Goal: get back  to exercising  OT Frequency:     Barriers to D/C:            Co-evaluation              End of Session Equipment Utilized During Treatment: Gait belt;Rolling walker Nurse Communication: Mobility status (pain)  Activity Tolerance: Patient tolerated treatment well Patient left: in chair;with call bell/phone within reach;with family/visitor present   Time: 1704-1730 OT Time Calculation (min): 26 min Charges:  OT General Charges $OT Visit: 1 Procedure OT Evaluation $Initial OT Evaluation Tier I: 1 Procedure OT Treatments $Self Care/Home Management : 8-22 mins G-CodesBenito Mccreedy OTR/L [phone] 05/21/2014, 5:47 PM

## 2014-05-21 NOTE — Discharge Instructions (Signed)
Total Hip Replacement, Care After °Refer to this sheet in the next few weeks. These instructions provide you with information on caring for yourself after your procedure. Your health care provider may also give you specific instructions. Your treatment has been planned according to the most current medical practices, but problems sometimes occur. Call your health care provider if you have any problems or questions after your procedure. °HOME CARE INSTRUCTIONS  ° °Weight bearing as tolerated.  Take Aspirin 1 tab a day for the next 30 days to prevent blood clots.  Change dressing daily starting on Saturday.  May shower on Monday, but do not soak incision.  May apply ice for up to 20 minutes at a time for pain and swelling.  Follow up appointment in two weeks.  ° °Your health care provider will give you specific precautions for certain types of movement. Additional instructions include: °· Take medicines only as directed by your health care provider. °· Take quick showers (3-5 min) rather than bathe until your health care provider tells you that you can take baths again. °· Avoid lifting until your health care provider instructs you otherwise. °· Use a raised toilet seat and avoid sitting in low chairs as instructed by your health care provider. °· Use crutches or a walker as instructed by your health care provider. °SEEK MEDICAL CARE IF: °· You have difficulty breathing. °· You have drainage, redness, or swelling at your incision site. °· You have a bad smell coming from your incision site. °· You have persistent bleeding from your incision site. °· Your incision breaks open after sutures (stitches) or staples have been removed. °· You have a fever. °SEEK IMMEDIATE MEDICAL CARE IF:  °· You have a rash. °· You have pain or swelling in your calf or thigh. °· You have shortness of breath or chest pain. °MAKE SURE YOU: °· Understand these instructions. °· Will watch your condition. °· Will get help if you are not doing  well or get worse. °Document Released: 01/07/2005 Document Revised: 11/04/2013 Document Reviewed: 08/21/2013 °[employer]® Patient Information ©2015 [employer]. This information is not intended to replace advice given to you by your health care provider. Make sure you discuss any questions you have with your health care provider. ° °

## 2014-05-21 NOTE — Transfer of Care (Signed)
Immediate Anesthesia Transfer of Care Note  Patient: Tony Douglas  Procedure(s) Performed: Procedure(s): RIGHT TOTAL HIP ARTHROPLASTY (Right)  Patient Location: PACU  Anesthesia Type:General  Level of Consciousness: sedated  Airway & Oxygen Therapy: Patient Spontanous Breathing and Patient connected to nasal cannula oxygen  Post-op Assessment: Report given to PACU RN and Post -op Vital signs reviewed and stable  Post vital signs: stable  Complications: No apparent anesthesia complications

## 2014-05-22 LAB — BASIC METABOLIC PANEL
ANION GAP: 15 (ref 5–15)
BUN: 17 mg/dL (ref 6–23)
CALCIUM: 8.9 mg/dL (ref 8.4–10.5)
CO2: 21 mEq/L (ref 19–32)
Chloride: 100 mEq/L (ref 96–112)
Creatinine, Ser: 0.96 mg/dL (ref 0.50–1.35)
GFR calc Af Amer: >90 mL/min (ref >90)
GFR calc non Af Amer: 86 mL/min — ABNORMAL LOW (ref >90)
Glucose, Bld: 114 mg/dL — ABNORMAL HIGH (ref 70–99)
Potassium: 4.3 mEq/L (ref 3.7–5.3)
SODIUM: 136 meq/L — AB (ref 137–147)

## 2014-05-22 LAB — CBC
HCT: 32.6 % — ABNORMAL LOW (ref 39.0–52.0)
Hemoglobin: 11.1 g/dL — ABNORMAL LOW (ref 13.0–17.0)
MCH: 29.8 pg (ref 26.0–34.0)
MCHC: 34 g/dL (ref 30.0–36.0)
MCV: 87.6 fL (ref 78.0–100.0)
PLATELETS: 190 10*3/uL (ref 150–400)
RBC: 3.72 MIL/uL — AB (ref 4.22–5.81)
RDW: 13 % (ref 11.5–15.5)
WBC: 10.4 10*3/uL (ref 4.0–10.5)

## 2014-05-22 NOTE — Plan of Care (Signed)
Problem: Phase I Progression Outcomes Goal: CMS/Neurovascular status WDL Outcome: Completed/Met Date Met:  05/22/14 Goal: Pain controlled with appropriate interventions Outcome: Completed/Met Date Met:  05/22/14 Goal: Dangle or out of bed evening of surgery Outcome: Completed/Met Date Met:  05/22/14 Goal: Initial discharge plan identified Outcome: Progressing Goal: Hemodynamically stable Outcome: Completed/Met Date Met:  05/22/14

## 2014-05-22 NOTE — Plan of Care (Signed)
Problem: Phase I Progression Outcomes Goal: Initial discharge plan identified Outcome: Completed/Met Date Met:  05/22/14 Goal: Other Phase I Outcomes/Goals Outcome: Not Applicable Date Met:  34/75/83  Problem: Phase II Progression Outcomes Goal: Ambulates Outcome: Completed/Met Date Met:  05/22/14 Goal: Tolerating diet Outcome: Completed/Met Date Met:  05/22/14 Goal: Discharge plan established Outcome: Completed/Met Date Met:  05/22/14 Goal: Other Phase II Outcomes/Goals Outcome: Completed/Met Date Met:  05/22/14  Problem: Phase III Progression Outcomes Goal: Pain controlled on oral analgesia Outcome: Completed/Met Date Met:  05/22/14 Goal: Ambulates Outcome: Completed/Met Date Met:  05/22/14 Goal: Incision clean - minimal/no drainage Outcome: Completed/Met Date Met:  05/22/14 Goal: Discharge plan remains appropriate-arrangements made Outcome: Completed/Met Date Met:  05/22/14 Goal: Anticoagulant follow-up in place Outcome: Completed/Met Date Met:  05/22/14 Goal: Other Phase III Outcomes/Goals Outcome: Completed/Met Date Met:  05/22/14  Problem: Discharge Progression Outcomes Goal: Barriers To Progression Addressed/Resolved Outcome: Completed/Met Date Met:  05/22/14 Goal: CMS/Neurovascular status at or above baseline Outcome: Completed/Met Date Met:  05/22/14 Goal: Anticoagulant follow-up in place Outcome: Completed/Met Date Met:  05/22/14 Goal: Pain controlled with appropriate interventions Outcome: Completed/Met Date Met:  05/22/14 Goal: Hemodynamically stable Outcome: Completed/Met Date Met:  07/46/00 Goal: Complications resolved/controlled Outcome: Completed/Met Date Met:  05/22/14 Goal: Tolerates diet Outcome: Completed/Met Date Met:  05/22/14 Goal: Activity appropriate for discharge plan Outcome: Completed/Met Date Met:  05/22/14 Goal: Ambulates safely using assistive device Outcome: Completed/Met Date Met:  05/22/14 Goal: Follows weight - bearing  limitations Outcome: Completed/Met Date Met:  05/22/14 Goal: Discharge plan in place and appropriate Outcome: Completed/Met Date Met:  05/22/14 Goal: Negotiates stairs Outcome: Completed/Met Date Met:  05/22/14 Goal: Demonstrates ADLs as appropriate Outcome: Completed/Met Date Met:  05/22/14 Goal: Incision without S/S infection Outcome: Completed/Met Date Met:  05/22/14 Goal: Other Discharge Outcomes/Goals Outcome: Not Applicable Date Met:  29/84/73

## 2014-05-22 NOTE — Progress Notes (Signed)
Subjective: 1 Day Post-Op Procedure(s) (LRB): RIGHT TOTAL HIP ARTHROPLASTY (Right) Patient reports pain as 1 on 0-10 scale.  No nausea/vomiting, lightheadedness/dizziness.  Tolerating diet.  Objective: Vital signs in last 24 hours: Temp:  [97.6 F (36.4 C)-98.7 F (37.1 C)] 97.8 F (36.6 C) (11/19 0541) Pulse Rate:  [80-105] 83 (11/19 0541) Resp:  [13-20] 16 (11/19 0541) BP: (103-126)/(49-74) 105/63 mmHg (11/19 0541) SpO2:  [93 %-97 %] 96 % (11/19 0541)  Intake/Output from previous day: 11/18 0701 - 11/19 0700 In: 2560 [P.O.:360; I.V.:2200] Out: 750 [Urine:300; Blood:450] Intake/Output this shift:    No results for input(s): HGB in the last 72 hours. No results for input(s): WBC, RBC, HCT, PLT in the last 72 hours. No results for input(s): NA, K, CL, CO2, BUN, CREATININE, GLUCOSE, CALCIUM in the last 72 hours. No results for input(s): LABPT, INR in the last 72 hours.  Neurologically intact Neurovascular intact Sensation intact distally Intact pulses distally Dorsiflexion/Plantar flexion intact Compartment soft  No drainage noted through dressing  Assessment/Plan: 1 Day Post-Op Procedure(s) (LRB): RIGHT TOTAL HIP ARTHROPLASTY (Right) Advance diet Up with therapy D/C IV fluids Discharge home with home health most likely today WBAT RLE Direct anterior total hip precautions  ANTON, M. LINDSEY 05/22/2014, 7:04 AM

## 2014-05-22 NOTE — Progress Notes (Signed)
Discharge instruction gave to patient and her wife, and question answered. PT did final evaluation with patient. Patient stated they are ready to go home.

## 2014-05-22 NOTE — Progress Notes (Signed)
Physical Therapy Treatment Patient Details Name: Tony Douglas MRN: [id number] DOB: September 12, 1950 Today's Date: 05/22/2014    History of Present Illness Patient is a 63 y/o male s/p R THA. WBAT RLE.    PT Comments    Pt. With excellent progress today .  Walking at mod I level with RW, has a level entry.  Goals met, education completed.  Pt. Has DC orders for home.  Will sign off.    Follow Up Recommendations  Home health PT;Supervision for mobility/OOB     Equipment Recommendations  None recommended by PT    Recommendations for Other Services       Precautions / Restrictions Precautions Precautions: Anterior Hip;None;Fall Precaution Comments: no hip precautions Restrictions Weight Bearing Restrictions: Yes RLE Weight Bearing: Weight bearing as tolerated    Mobility  Bed Mobility Overal bed mobility:  (not tested, in recliner)                Transfers Overall transfer level: Modified independent Equipment used: Rolling walker (2 wheeled) Transfers: Sit to/from Stand Sit to Stand: Modified independent (Device/Increase time)         General transfer comment: mod I for sit<->stand with correct hand placement , no cueing or physical assist needed  Ambulation/Gait Ambulation/Gait assistance: Modified independent (Device/Increase time) Ambulation Distance (Feet): 250 Feet Assistive device: Rolling walker (2 wheeled) Gait Pattern/deviations: Step-through pattern (emerging step through pattern) Gait velocity: slowed but improving since am session   General Gait Details: good management of RW , smooth gait pattern   Stairs            Wheelchair Mobility    Modified Rankin (Stroke Patients Only)       Balance                                    Cognition Arousal/Alertness: Awake/alert Behavior During Therapy: WFL for tasks assessed/performed Overall Cognitive Status: Within Functional Limits for tasks assessed                       Exercises      General Comments        Pertinent Vitals/Pain Pain Assessment: 0-10 Pain Score: 4  Pain Location: right anterior hip Pain Descriptors / Indicators: Sore Pain Intervention(s): Monitored during session;Limited activity within patient's tolerance;Repositioned    Home Living                      Prior Function            PT Goals (current goals can now be found in the care plan section) Progress towards PT goals: Goals met/education completed, patient discharged from PT    Frequency  7X/week    PT Plan Current plan remains appropriate    Co-evaluation             End of Session Equipment Utilized During Treatment: Gait belt Activity Tolerance: Patient tolerated treatment well Patient left: in chair;with call bell/phone within reach;with family/visitor present     Time: 9485-4627 PT Time Calculation (min) (ACUTE ONLY): 16 min  Charges:  $Gait Training: 8-22 mins                    G Codes:      Ladona Ridgel 05/22/2014, 4:00 PM Gerlean Ren PT Acute Rehab Services [phone] Bee[place] [phone]

## 2014-05-22 NOTE — Progress Notes (Signed)
Physical Therapy Treatment Patient Details Name: Tony Douglas MRN: [id number] DOB: Nov 29, 1950 Today's Date: 05/22/2014    History of Present Illness Patient is a 63 y/o male s/p R THA. WBAT RLE.    PT Comments    Pt. Progressing well with PT.  Would like one more PT session today before [place].  I will see him later today.  I instructed him in how to adjust his borrowed RW to his height.    Follow Up Recommendations  Home health PT;Supervision for mobility/OOB     Equipment Recommendations  None recommended by PT    Recommendations for Other Services       Precautions / Restrictions Precautions Precautions: Fall;Anterior Hip;Other (comment) (no precautions) Precaution Comments: no hip precautions Restrictions Weight Bearing Restrictions: Yes RLE Weight Bearing: Weight bearing as tolerated    Mobility  Bed Mobility Overal bed mobility:  (not tesxted; pt. presents in recliner)                Transfers Overall transfer level: Needs assistance Equipment used: Rolling walker (2 wheeled) Transfers: Sit to/from Stand Sit to Stand: Supervision         General transfer comment: suervision for safety; uses good hand placement  Ambulation/Gait Ambulation/Gait assistance: Supervision Ambulation Distance (Feet): 150 Feet Assistive device: Rolling walker (2 wheeled) Gait Pattern/deviations: Step-through pattern (emerging step through pattern)     General Gait Details: Good technique, no overt LOB, no dizziness   Stairs            Wheelchair Mobility    Modified Rankin (Stroke Patients Only)       Balance                                    Cognition Arousal/Alertness: Awake/alert Behavior During Therapy: WFL for tasks assessed/performed Overall Cognitive Status: Within Functional Limits for tasks assessed                      Exercises Total Joint Exercises Ankle Circles/Pumps: Both;10 reps;Seated Gluteal Sets:  AROM;Both;10 reps;Seated Long Arc Quad: AROM;Right;10 reps;Seated    General Comments        Pertinent Vitals/Pain Pain Assessment: 0-10 Pain Score: 2  Pain Location: right anterior hip Pain Descriptors / Indicators: Sore Pain Intervention(s): Monitored during session;Repositioned    Home Living                      Prior Function            PT Goals (current goals can now be found in the care plan section) Progress towards PT goals: Progressing toward goals    Frequency  7X/week    PT Plan Current plan remains appropriate    Co-evaluation             End of Session Equipment Utilized During Treatment: Gait belt Activity Tolerance: Patient tolerated treatment well Patient left: in chair;with call bell/phone within reach;with family/visitor present     Time: 8938-1017 PT Time Calculation (min) (ACUTE ONLY): 25 min  Charges:  $Gait Training: 8-22 mins $Therapeutic Exercise: 8-22 mins                    G Codes:      Ladona Ridgel 05/22/2014, 8:45 AM Gerlean Ren PT Acute Rehab Services [phone] [facility] [phone]

## 2014-05-23 NOTE — Care Management Note (Signed)
CARE MANAGEMENT NOTE 05/23/2014  Patient:  Tony Douglas, Tony Douglas   Account Number:  [id number]  Date Initiated:  05/22/2014  Documentation initiated by:  Ricki Miller  Subjective/Objective Assessment:   63 yr old male admitted with right hip DJD. Patient had a right total hip arthroplasty.     Action/Plan:   Patient was preoperatively setup with [place], no changes. Has rolling walker and 3in1. Patient has support at discharge.   Anticipated DC Date:  05/22/2014   Anticipated DC Plan:  [place]  CM consult      [facility] Choice  HOME HEALTH   Choice offered to / List presented to:  C-1 Patient   DME arranged  NA        [place] arranged  [place] [facility]      [place].   Status of service:  Completed, signed off Medicare Important Message given?   (If response is "NO", the following Medicare IM given date fields will be blank) Date Medicare IM given:   Medicare IM given by:   Date Additional Medicare IM given:   Additional Medicare IM given by:    Discharge Disposition:  [place]  Per UR Regulation:  Reviewed for med. necessity/level of care/duration of stay

## 2014-05-27 ENCOUNTER — Encounter (HOSPITAL_COMMUNITY): Payer: Self-pay | Admitting: Orthopedic Surgery

## 2016-04-21 DIAGNOSIS — E782 Mixed hyperlipidemia: Secondary | ICD-10-CM | POA: Diagnosis not present

## 2016-04-21 DIAGNOSIS — K219 Gastro-esophageal reflux disease without esophagitis: Secondary | ICD-10-CM | POA: Diagnosis not present

## 2016-04-21 DIAGNOSIS — I10 Essential (primary) hypertension: Secondary | ICD-10-CM | POA: Diagnosis not present

## 2016-04-21 DIAGNOSIS — Z0001 Encounter for general adult medical examination with abnormal findings: Secondary | ICD-10-CM | POA: Diagnosis not present

## 2016-04-21 DIAGNOSIS — N400 Enlarged prostate without lower urinary tract symptoms: Secondary | ICD-10-CM | POA: Diagnosis not present

## 2016-04-25 DIAGNOSIS — K219 Gastro-esophageal reflux disease without esophagitis: Secondary | ICD-10-CM | POA: Diagnosis not present

## 2016-04-25 DIAGNOSIS — Z6833 Body mass index (BMI) 33.0-33.9, adult: Secondary | ICD-10-CM | POA: Diagnosis not present

## 2016-04-25 DIAGNOSIS — I10 Essential (primary) hypertension: Secondary | ICD-10-CM | POA: Diagnosis not present

## 2016-04-25 DIAGNOSIS — N400 Enlarged prostate without lower urinary tract symptoms: Secondary | ICD-10-CM | POA: Diagnosis not present

## 2016-04-25 DIAGNOSIS — J449 Chronic obstructive pulmonary disease, unspecified: Secondary | ICD-10-CM | POA: Diagnosis not present

## 2016-04-25 DIAGNOSIS — E782 Mixed hyperlipidemia: Secondary | ICD-10-CM | POA: Diagnosis not present

## 2016-04-25 DIAGNOSIS — Z23 Encounter for immunization: Secondary | ICD-10-CM | POA: Diagnosis not present

## 2016-05-31 DIAGNOSIS — M25561 Pain in right knee: Secondary | ICD-10-CM | POA: Diagnosis not present

## 2016-05-31 DIAGNOSIS — M7061 Trochanteric bursitis, right hip: Secondary | ICD-10-CM | POA: Diagnosis not present

## 2016-07-11 DIAGNOSIS — R05 Cough: Secondary | ICD-10-CM | POA: Diagnosis not present

## 2016-07-11 DIAGNOSIS — Z6833 Body mass index (BMI) 33.0-33.9, adult: Secondary | ICD-10-CM | POA: Diagnosis not present

## 2016-07-11 DIAGNOSIS — J0100 Acute maxillary sinusitis, unspecified: Secondary | ICD-10-CM | POA: Diagnosis not present

## 2016-10-12 DIAGNOSIS — E782 Mixed hyperlipidemia: Secondary | ICD-10-CM | POA: Diagnosis not present

## 2016-10-12 DIAGNOSIS — N400 Enlarged prostate without lower urinary tract symptoms: Secondary | ICD-10-CM | POA: Diagnosis not present

## 2016-10-12 DIAGNOSIS — D649 Anemia, unspecified: Secondary | ICD-10-CM | POA: Diagnosis not present

## 2016-10-12 DIAGNOSIS — F331 Major depressive disorder, recurrent, moderate: Secondary | ICD-10-CM | POA: Diagnosis not present

## 2016-10-12 DIAGNOSIS — J449 Chronic obstructive pulmonary disease, unspecified: Secondary | ICD-10-CM | POA: Diagnosis not present

## 2016-10-12 DIAGNOSIS — I10 Essential (primary) hypertension: Secondary | ICD-10-CM | POA: Diagnosis not present

## 2016-10-12 DIAGNOSIS — K210 Gastro-esophageal reflux disease with esophagitis: Secondary | ICD-10-CM | POA: Diagnosis not present

## 2016-10-12 DIAGNOSIS — Z9189 Other specified personal risk factors, not elsewhere classified: Secondary | ICD-10-CM | POA: Diagnosis not present

## 2016-10-14 DIAGNOSIS — Z6833 Body mass index (BMI) 33.0-33.9, adult: Secondary | ICD-10-CM | POA: Diagnosis not present

## 2016-10-14 DIAGNOSIS — F331 Major depressive disorder, recurrent, moderate: Secondary | ICD-10-CM | POA: Diagnosis not present

## 2016-10-14 DIAGNOSIS — I10 Essential (primary) hypertension: Secondary | ICD-10-CM | POA: Diagnosis not present

## 2016-10-14 DIAGNOSIS — E782 Mixed hyperlipidemia: Secondary | ICD-10-CM | POA: Diagnosis not present

## 2016-10-14 DIAGNOSIS — J301 Allergic rhinitis due to pollen: Secondary | ICD-10-CM | POA: Diagnosis not present

## 2016-10-14 DIAGNOSIS — Z23 Encounter for immunization: Secondary | ICD-10-CM | POA: Diagnosis not present

## 2016-10-14 DIAGNOSIS — N400 Enlarged prostate without lower urinary tract symptoms: Secondary | ICD-10-CM | POA: Diagnosis not present

## 2016-10-14 DIAGNOSIS — K219 Gastro-esophageal reflux disease without esophagitis: Secondary | ICD-10-CM | POA: Diagnosis not present

## 2017-02-17 DIAGNOSIS — M79641 Pain in right hand: Secondary | ICD-10-CM | POA: Diagnosis not present

## 2017-02-17 DIAGNOSIS — M79642 Pain in left hand: Secondary | ICD-10-CM | POA: Diagnosis not present

## 2017-02-17 DIAGNOSIS — M542 Cervicalgia: Secondary | ICD-10-CM | POA: Diagnosis not present

## 2017-02-22 ENCOUNTER — Encounter: Payer: Self-pay | Admitting: Neurology

## 2017-02-27 ENCOUNTER — Other Ambulatory Visit: Payer: Self-pay | Admitting: *Deleted

## 2017-02-27 DIAGNOSIS — R200 Anesthesia of skin: Secondary | ICD-10-CM

## 2017-03-28 ENCOUNTER — Encounter: Payer: Self-pay | Admitting: Neurology

## 2017-04-06 ENCOUNTER — Ambulatory Visit (INDEPENDENT_AMBULATORY_CARE_PROVIDER_SITE_OTHER): Payer: Medicare Other | Admitting: Neurology

## 2017-04-06 DIAGNOSIS — R200 Anesthesia of skin: Secondary | ICD-10-CM

## 2017-04-06 DIAGNOSIS — G5603 Carpal tunnel syndrome, bilateral upper limbs: Secondary | ICD-10-CM

## 2017-04-06 NOTE — Procedures (Signed)
[facility]  [address]  [place], [place] [address] Tel: [phone] Fax:  [phone] Test Date:  04/06/2017  Patient: Tony Douglas DOB: 1951/05/04 Physician: Narda Amber, DO  Sex: Male Height: 5\' 10"  Ref Phys: Kathryne Hitch, MD  ID#: [id number] Temp: 33.4C Technician:    Patient Complaints: This is a 66 year old gentleman referred for evaluation of bilateral hand paresthesias and left hand weakness.  NCV & EMG Findings: Extensive electrodiagnostic testing of the right upper extremity and additional studies of the left shows:  1. Bilateral median sensory responses are absent. Bilateral ulnar sensory responses are within normal limits. 2. 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  Right median motor response shows markedly prolonged latency (11.3 ms) and reduced amplitude (2.3 mV).  The left ulnar motor response shows reduced amplitude and absolute conduction velocity slowing across the elbow of 14 m/s.  The right ulnar motor responses within normal limits. 3. Chronic motor axon loss changes are seen in bilateral abductor pollicis brevis muscles, which is more severe on the left with there is also active denervation. Further, there is chronic neurogenic changes in the left ulnar innervated muscles.   Impression: 1. Bilateral median neuropathy at or distal to the wrist, consistent with the clinical diagnosis of carpal tunnel syndrome. Overall, these findings are very severe in degree electrically, and worse on the left. 2. Left ulnar neuropathy with slowing across the elbow, demyelinating and axon loss in type; mild to moderate in degree. 3. Incidentally, there is a left Martin-Gruber anastomosis, a normal variant.   ___________________________ Narda Amber, DO    Nerve Conduction Studies Anti Sensory Summary Table   Site NR Peak (ms) Norm Peak (ms) P-T Amp (V) Norm P-T Amp  Left Median Anti Sensory (2nd Digit)  Wrist NR  <3.8  >10  Right Median Anti Sensory (2nd Digit)  Wrist NR  <3.8  >10  Left Ulnar Anti Sensory (5th Digit)  Wrist    3.0 <3.2 10.5 >5  Right Ulnar Anti Sensory (5th Digit)  Wrist    3.2 <3.2 6.3 >5   Motor Summary Table   Site NR Onset (ms) Norm Onset (ms) O-P Amp (mV) Norm O-P Amp Site1 Site2 Delta-0 (ms) Dist (cm) Vel (m/s) Norm Vel (m/s)  Left Median Motor (Abd Poll Brev)  Wrist NR  <4.0  >5 Elbow Wrist  0.0  >50  Elbow NR     Ulnar-wrist crossover Elbow  0.0    Ulnar-wrist crossover    4.6  5.5         Right Median Motor (Abd Poll Brev)  Wrist    11.3 <4.0 2.3 >5 Elbow Wrist 6.0 31.0 52 >50  Elbow    17.3  2.3         Left Ulnar Motor (Abd Dig Minimi)  Wrist    2.9 <3.1 5.9 >7 B Elbow Wrist 3.7 26.0 70 >50  B Elbow    6.6  5.0  A Elbow B Elbow 1.8 10.0 56 >50  A Elbow    8.4  4.1         Right Ulnar Motor (Abd Dig Minimi)  Wrist    2.7 <3.1 8.6 >7 B Elbow Wrist 4.4 28.0 64 >50  B Elbow    7.1  7.8  A Elbow B  Elbow 1.8 10.0 56 >50  A Elbow    8.9  7.3         Left Ulnar (FDI) Motor (1st DI)  Wrist    4.0 <4.5 8.9 >7         EMG   Side Muscle Ins Act Fibs Psw Fasc Number Recrt Dur Dur. Amp Amp. Poly Poly. Comment  Right 1stDorInt Nml Nml Nml Nml Nml Nml Nml Nml Nml Nml Nml Nml N/A  Right Abd Poll Brev Nml Nml Nml Nml 1- Rapid Some 1+ Some 1+ Nml Nml N/A  Right Ext Indicis Nml Nml Nml Nml Nml Nml Nml Nml Nml Nml Nml Nml N/A  Right PronatorTeres Nml Nml Nml Nml Nml Nml Nml Nml Nml Nml Nml Nml N/A  Right Triceps Nml Nml Nml Nml Nml Nml Nml Nml Nml Nml Nml Nml N/A  Right Deltoid Nml Nml Nml Nml Nml Nml Nml Nml Nml Nml Nml Nml N/A  Right FlexCarpiUln Nml Nml Nml Nml Nml Nml Nml Nml Nml Nml Nml Nml N/A  Right ABD Dig Min Nml Nml Nml Nml Nml Nml Nml Nml Nml Nml Nml Nml N/A  Left Biceps Nml Nml Nml Nml  Nml Nml Nml Nml Nml Nml Nml Nml N/A  Left 1stDorInt Nml Nml Nml Nml Nml Nml Nml Nml Nml Nml Nml Nml N/A  Left Abd Poll Brev Nml Nml 1+ Nml SMU Rapid All 1+ All 1+ All 1+ ATR  Left FlexPolLong Nml Nml Nml Nml Nml Nml Nml Nml Nml Nml Nml Nml N/A  Left Ext Indicis Nml Nml Nml Nml 1- Rapid Some 1+ Some 1+ Nml Nml N/A  Left PronatorTeres Nml Nml Nml Nml Nml Nml Nml Nml Nml Nml Nml Nml N/A  Left ABD Dig Min Nml Nml Nml Nml 1- Rapid Some 1+ Some 1+ Nml Nml N/A  Left Triceps Nml Nml Nml Nml Nml Nml Nml Nml Nml Nml Nml Nml N/A  Left Deltoid Nml Nml Nml Nml 1- Rapid Some 1+ Some 1+ Nml Nml N/A      Waveforms:

## 2017-04-06 NOTE — Progress Notes (Signed)
EMG note faxed to Raliegh Ip at [phone] with confirmation received.

## 2017-04-18 DIAGNOSIS — M542 Cervicalgia: Secondary | ICD-10-CM | POA: Diagnosis not present

## 2017-04-19 ENCOUNTER — Encounter (HOSPITAL_BASED_OUTPATIENT_CLINIC_OR_DEPARTMENT_OTHER): Payer: Self-pay | Admitting: *Deleted

## 2017-04-19 NOTE — Progress Notes (Signed)
   04/19/17 1051  OBSTRUCTIVE SLEEP APNEA  Have you ever been diagnosed with sleep apnea through a sleep study? No  Do you snore loudly (loud enough to be heard through closed doors)?  1  Do you often feel tired, fatigued, or sleepy during the daytime (such as falling asleep during driving or talking to someone)? 0  Has anyone observed you stop breathing during your sleep? 0  Do you have, or are you being treated for high blood pressure? 1  BMI more than 35 kg/m2? 0  Age > 50 (1-yes) 1  Neck circumference greater than:Male 16 inches or larger, Male 17inches or larger? 0  Male Gender (Yes=1) 1  Obstructive Sleep Apnea Score 4

## 2017-04-19 NOTE — H&P (Signed)
This is a pleasant 66 year-old gentleman who presents to our clinic today with bilateral hand pain, both equally as bad.  This began as intermittent pain on both sides, which was more or less a constant ache in his hands with numbness, tingling and burning to his thumb, index and long fingers.  About three months ago this became more constant.  No new injury or change in activity.  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  No history of cervical pathology.   Past medical, social and family history reviewed in detail on the patient questionnaire and signed.  Review of systems: As detailed in HPI.  All others reviewed and are negative.   EXAMINATION: Well-developed, well-nourished gentleman in no acute distress.  Alert and oriented x 3.  Examination of his left hand reveals mild thenar atrophy.  No intrinsic atrophy and negative tinels at the elbow.  Positive Phalen's.  Positive Tinel's at the wrist.  Decreased grip strength.  Decreased sensation in the median nerve distribution.    X-RAYS: X-rays of his hand are unremarkable.  X-rays of his neck reveal marked degenerative changes at C5-6 and C6-7.   IMPRESSION: Bilateral carpal tunnel syndrome versus less likely cervical spine radiculopathy, despite changes on x-rays.    PLAN: At this point we are going to get EMG studies of Jairus's hands to assess his median nerve.  He will follow up with [place] once this is completed.    Addendum: EMG study confirms bilateral left median nerve neuropathy severe in nature.  We have recommended carpal tunnel release.  He would like to do the left first.  Risks, benefits and possible complications reviewed.  Rehab and recovery time discussed.  All questions answered.

## 2017-04-20 ENCOUNTER — Encounter (HOSPITAL_BASED_OUTPATIENT_CLINIC_OR_DEPARTMENT_OTHER)
Admission: RE | Disposition: A | Discharge status: Home / Self Care | Payer: Self-pay | Source: Ambulatory Visit | Attending: Orthopedic Surgery

## 2017-04-20 ENCOUNTER — Ambulatory Visit (HOSPITAL_BASED_OUTPATIENT_CLINIC_OR_DEPARTMENT_OTHER): Payer: Medicare Other | Admitting: Certified Registered"

## 2017-04-20 ENCOUNTER — Encounter (HOSPITAL_BASED_OUTPATIENT_CLINIC_OR_DEPARTMENT_OTHER): Payer: Self-pay | Admitting: *Deleted

## 2017-04-20 ENCOUNTER — Ambulatory Visit (HOSPITAL_BASED_OUTPATIENT_CLINIC_OR_DEPARTMENT_OTHER)
Admission: RE | Admit: 2017-04-20 | Discharge: 2017-04-20 | Disposition: A | Discharge status: Home / Self Care | Payer: Medicare Other | Source: Ambulatory Visit | Attending: Orthopedic Surgery | Admitting: Orthopedic Surgery

## 2017-04-20 DIAGNOSIS — Z79899 Other long term (current) drug therapy: Secondary | ICD-10-CM | POA: Diagnosis not present

## 2017-04-20 DIAGNOSIS — Z87891 Personal history of nicotine dependence: Secondary | ICD-10-CM | POA: Diagnosis not present

## 2017-04-20 DIAGNOSIS — G5602 Carpal tunnel syndrome, left upper limb: Secondary | ICD-10-CM | POA: Insufficient documentation

## 2017-04-20 DIAGNOSIS — K219 Gastro-esophageal reflux disease without esophagitis: Secondary | ICD-10-CM | POA: Insufficient documentation

## 2017-04-20 DIAGNOSIS — J45909 Unspecified asthma, uncomplicated: Secondary | ICD-10-CM | POA: Insufficient documentation

## 2017-04-20 DIAGNOSIS — I10 Essential (primary) hypertension: Secondary | ICD-10-CM | POA: Insufficient documentation

## 2017-04-20 HISTORY — DX: Carpal tunnel syndrome, left upper limb: G56.02

## 2017-04-20 HISTORY — PX: CARPAL TUNNEL RELEASE: SHX101

## 2017-04-20 LAB — POCT I-STAT, CHEM 8
BUN: 18 mg/dL (ref 6–20)
CALCIUM ION: 1.2 mmol/L (ref 1.15–1.40)
CREATININE: 0.9 mg/dL (ref 0.61–1.24)
Chloride: 103 mmol/L (ref 101–111)
GLUCOSE: 117 mg/dL — AB (ref 65–99)
HCT: 40 % (ref 39.0–52.0)
HEMOGLOBIN: 13.6 g/dL (ref 13.0–17.0)
POTASSIUM: 3.8 mmol/L (ref 3.5–5.1)
Sodium: 139 mmol/L (ref 135–145)
TCO2: 23 mmol/L (ref 22–32)

## 2017-04-20 SURGERY — CARPAL TUNNEL RELEASE
Anesthesia: General | Site: Wrist | Laterality: Left

## 2017-04-20 MED ORDER — CEFAZOLIN SODIUM-DEXTROSE 2-4 GM/100ML-% IV SOLN: 2.0000 g | INTRAVENOUS | Status: DC

## 2017-04-20 MED ORDER — METOCLOPRAMIDE HCL 5 MG PO TABS: 5.0000 mg | ORAL_TABLET | Freq: Three times a day (TID) | ORAL | Status: DC | PRN

## 2017-04-20 MED ORDER — DEXAMETHASONE SODIUM PHOSPHATE 10 MG/ML IJ SOLN
INTRAMUSCULAR | Status: AC
  Filled 2017-04-20: qty 1

## 2017-04-20 MED ORDER — FENTANYL CITRATE (PF) 100 MCG/2ML IJ SOLN: 25.0000 ug | INTRAMUSCULAR | Status: DC | PRN

## 2017-04-20 MED ORDER — LACTATED RINGERS IV SOLN: INTRAVENOUS | Status: DC

## 2017-04-20 MED ORDER — CHLORHEXIDINE GLUCONATE 4 % EX LIQD: 60.0000 mL | Freq: Once | CUTANEOUS | Status: DC

## 2017-04-20 MED ORDER — METOCLOPRAMIDE HCL 5 MG/ML IJ SOLN: 10.0000 mg | Freq: Once | INTRAMUSCULAR | Status: DC | PRN

## 2017-04-20 MED ORDER — SCOPOLAMINE 1 MG/3DAYS TD PT72: 1.0000 | MEDICATED_PATCH | Freq: Once | TRANSDERMAL | Status: DC | PRN

## 2017-04-20 MED ORDER — LIDOCAINE HCL (CARDIAC) 20 MG/ML IV SOLN
INTRAVENOUS | Status: DC | PRN
  Administered 2017-04-20: 100 mg via INTRAVENOUS

## 2017-04-20 MED ORDER — FENTANYL CITRATE (PF) 100 MCG/2ML IJ SOLN
INTRAMUSCULAR | Status: AC
  Filled 2017-04-20: qty 2

## 2017-04-20 MED ORDER — LIDOCAINE 2% (20 MG/ML) 5 ML SYRINGE
INTRAMUSCULAR | Status: AC
  Filled 2017-04-20: qty 5

## 2017-04-20 MED ORDER — CEFAZOLIN SODIUM-DEXTROSE 2-4 GM/100ML-% IV SOLN
INTRAVENOUS | Status: AC
  Filled 2017-04-20: qty 100

## 2017-04-20 MED ORDER — DEXAMETHASONE SODIUM PHOSPHATE 10 MG/ML IJ SOLN
INTRAMUSCULAR | Status: DC | PRN
  Administered 2017-04-20: 10 mg via INTRAVENOUS

## 2017-04-20 MED ORDER — MEPERIDINE HCL 25 MG/ML IJ SOLN: 6.2500 mg | INTRAMUSCULAR | Status: DC | PRN

## 2017-04-20 MED ORDER — CEFAZOLIN SODIUM-DEXTROSE 2-3 GM-%(50ML) IV SOLR
INTRAVENOUS | Status: DC | PRN
  Administered 2017-04-20: 2 g via INTRAVENOUS

## 2017-04-20 MED ORDER — MIDAZOLAM HCL 2 MG/2ML IJ SOLN
INTRAMUSCULAR | Status: AC
  Filled 2017-04-20: qty 2

## 2017-04-20 MED ORDER — PROPOFOL 10 MG/ML IV BOLUS
INTRAVENOUS | Status: AC
  Filled 2017-04-20: qty 40

## 2017-04-20 MED ORDER — OXYCODONE-ACETAMINOPHEN 5-325 MG PO TABS: 1.0000 | ORAL_TABLET | ORAL | 0 refills | Status: DC | PRN

## 2017-04-20 MED ORDER — MIDAZOLAM HCL 2 MG/2ML IJ SOLN
1.0000 mg | INTRAMUSCULAR | Status: DC | PRN
  Administered 2017-04-20: 2 mg via INTRAVENOUS

## 2017-04-20 MED ORDER — METOCLOPRAMIDE HCL 5 MG/ML IJ SOLN: 5.0000 mg | Freq: Three times a day (TID) | INTRAMUSCULAR | Status: DC | PRN

## 2017-04-20 MED ORDER — ONDANSETRON HCL 4 MG/2ML IJ SOLN
INTRAMUSCULAR | Status: DC | PRN
  Administered 2017-04-20: 4 mg via INTRAVENOUS

## 2017-04-20 MED ORDER — ONDANSETRON HCL 4 MG PO TABS: 4.0000 mg | ORAL_TABLET | Freq: Three times a day (TID) | ORAL | 0 refills | Status: DC | PRN

## 2017-04-20 MED ORDER — BUPIVACAINE HCL (PF) 0.25 % IJ SOLN
INTRAMUSCULAR | Status: DC | PRN
  Administered 2017-04-20: 10 mL

## 2017-04-20 MED ORDER — ONDANSETRON HCL 4 MG PO TABS: 4.0000 mg | ORAL_TABLET | Freq: Four times a day (QID) | ORAL | Status: DC | PRN

## 2017-04-20 MED ORDER — PHENYLEPHRINE HCL 10 MG/ML IJ SOLN
INTRAMUSCULAR | Status: DC | PRN
  Administered 2017-04-20 (×4): 120 ug via INTRAVENOUS
  Administered 2017-04-20: 80 ug via INTRAVENOUS
  Administered 2017-04-20 (×2): 120 ug via INTRAVENOUS

## 2017-04-20 MED ORDER — OXYCODONE-ACETAMINOPHEN 5-325 MG PO TABS: 1.0000 | ORAL_TABLET | ORAL | Status: DC | PRN

## 2017-04-20 MED ORDER — PROPOFOL 10 MG/ML IV BOLUS
INTRAVENOUS | Status: DC | PRN
  Administered 2017-04-20: 200 mg via INTRAVENOUS

## 2017-04-20 MED ORDER — ONDANSETRON HCL 4 MG/2ML IJ SOLN
INTRAMUSCULAR | Status: AC
  Filled 2017-04-20: qty 2

## 2017-04-20 MED ORDER — FENTANYL CITRATE (PF) 100 MCG/2ML IJ SOLN
50.0000 ug | INTRAMUSCULAR | Status: DC | PRN
  Administered 2017-04-20: 50 ug via INTRAVENOUS

## 2017-04-20 MED ORDER — LACTATED RINGERS IV SOLN
INTRAVENOUS | Status: DC
  Administered 2017-04-20: 10:00:00 via INTRAVENOUS

## 2017-04-20 MED ORDER — ONDANSETRON HCL 4 MG/2ML IJ SOLN: 4.0000 mg | Freq: Four times a day (QID) | INTRAMUSCULAR | Status: DC | PRN

## 2017-04-20 MED ORDER — EPHEDRINE SULFATE 50 MG/ML IJ SOLN
INTRAMUSCULAR | Status: DC | PRN
  Administered 2017-04-20: 10 mg via INTRAVENOUS

## 2017-04-20 SURGICAL SUPPLY — 41 items
BANDAGE ACE 3X5.8 VEL STRL LF (GAUZE/BANDAGES/DRESSINGS) ×3 IMPLANT
BLADE SURG 15 STRL LF DISP TIS (BLADE) ×1 IMPLANT
BLADE SURG 15 STRL SS (BLADE) ×2
BNDG COHESIVE 3X5 TAN STRL LF (GAUZE/BANDAGES/DRESSINGS) ×3 IMPLANT
BNDG ESMARK 4X9 LF (GAUZE/BANDAGES/DRESSINGS) IMPLANT
CORD BIPOLAR FORCEPS 12FT (ELECTRODE) ×3 IMPLANT
COVER BACK TABLE 60X90IN (DRAPES) ×3 IMPLANT
COVER MAYO STAND STRL (DRAPES) ×3 IMPLANT
CUFF TOURNIQUET SINGLE 18IN (TOURNIQUET CUFF) ×3 IMPLANT
DRAPE EXTREMITY T 121X128X90 (DRAPE) ×3 IMPLANT
DRAPE SURG 17X23 STRL (DRAPES) ×3 IMPLANT
DURAPREP 26ML APPLICATOR (WOUND CARE) ×3 IMPLANT
GAUZE SPONGE 4X4 12PLY STRL (GAUZE/BANDAGES/DRESSINGS) ×3 IMPLANT
GAUZE XEROFORM 1X8 LF (GAUZE/BANDAGES/DRESSINGS) ×3 IMPLANT
GLOVE BIO SURGEON STRL SZ 6.5 (GLOVE) ×2 IMPLANT
GLOVE BIO SURGEONS STRL SZ 6.5 (GLOVE) ×1
GLOVE BIOGEL PI IND STRL 7.0 (GLOVE) ×1 IMPLANT
GLOVE BIOGEL PI IND STRL 8 (GLOVE) ×1 IMPLANT
GLOVE BIOGEL PI INDICATOR 7.0 (GLOVE) ×2
GLOVE BIOGEL PI INDICATOR 8 (GLOVE) ×2
GLOVE ECLIPSE 7.0 STRL STRAW (GLOVE) ×3 IMPLANT
GLOVE SURG ORTHO 8.0 STRL STRW (GLOVE) ×3 IMPLANT
GOWN STRL REUS W/ TWL LRG LVL3 (GOWN DISPOSABLE) ×1 IMPLANT
GOWN STRL REUS W/ TWL XL LVL3 (GOWN DISPOSABLE) ×2 IMPLANT
GOWN STRL REUS W/TWL LRG LVL3 (GOWN DISPOSABLE) ×2
GOWN STRL REUS W/TWL XL LVL3 (GOWN DISPOSABLE) ×4
NEEDLE HYPO 25X1 1.5 SAFETY (NEEDLE) ×3 IMPLANT
NS IRRIG 1000ML POUR BTL (IV SOLUTION) ×3 IMPLANT
PACK BASIN DAY SURGERY FS (CUSTOM PROCEDURE TRAY) ×3 IMPLANT
PAD CAST 3X4 CTTN HI CHSV (CAST SUPPLIES) ×2 IMPLANT
PADDING CAST ABS 3INX4YD NS (CAST SUPPLIES) ×2
PADDING CAST ABS COTTON 3X4 (CAST SUPPLIES) ×1 IMPLANT
PADDING CAST COTTON 3X4 STRL (CAST SUPPLIES) ×4
SPLINT PLASTER CAST XFAST 3X15 (CAST SUPPLIES) ×10 IMPLANT
SPLINT PLASTER XTRA FASTSET 3X (CAST SUPPLIES) ×20
STOCKINETTE 4X48 STRL (DRAPES) ×3 IMPLANT
SUT ETHILON 3 0 PS 1 (SUTURE) ×3 IMPLANT
SYR BULB 3OZ (MISCELLANEOUS) ×3 IMPLANT
SYR CONTROL 10ML LL (SYRINGE) ×3 IMPLANT
TOWEL OR 17X24 6PK STRL BLUE (TOWEL DISPOSABLE) ×3 IMPLANT
UNDERPAD 30X30 (UNDERPADS AND DIAPERS) ×3 IMPLANT

## 2017-04-20 NOTE — Transfer of Care (Signed)
Immediate Anesthesia Transfer of Care Note  Patient: JAMAR WEATHERALL  Procedure(s) Performed: LEFT CARPAL TUNNEL RELEASE (Left Wrist)  Patient Location: PACU  Anesthesia Type:General  Level of Consciousness: awake, alert  and oriented  Airway & Oxygen Therapy: Patient Spontanous Breathing and Patient connected to face mask oxygen  Post-op Assessment: Report given to RN and Post -op Vital signs reviewed and stable  Post vital signs: Reviewed and stable  Last Vitals:  Vitals:   04/20/17 0925 04/20/17 1234  BP: (!) 143/75 126/78  Pulse: 77 81  Resp: 18 17  Temp: 36.8 C   SpO2: 97% 99%    Last Pain:  Vitals:   04/20/17 0925  TempSrc: Oral      Patients Stated Pain Goal: 0 (60/15/61 5379)  Complications: No apparent anesthesia complications

## 2017-04-20 NOTE — Anesthesia Preprocedure Evaluation (Signed)
Anesthesia Evaluation  Patient identified by MRN, date of birth, ID band Patient awake    Reviewed: Allergy & Precautions, NPO status , Patient's Chart, lab work & pertinent test results  History of Anesthesia Complications (+) PONV  Airway Mallampati: II  TM Distance: >3 FB Neck ROM: Full    Dental no notable dental hx.    Pulmonary asthma , former smoker,    Pulmonary exam normal breath sounds clear to auscultation       Cardiovascular hypertension, Normal cardiovascular exam Rhythm:Regular Rate:Normal     Neuro/Psych negative neurological ROS  negative psych ROS   GI/Hepatic Neg liver ROS, GERD  Medicated and Controlled,  Endo/Other  negative endocrine ROS  Renal/GU negative Renal ROS  negative genitourinary   Musculoskeletal negative musculoskeletal ROS (+)   Abdominal   Peds negative pediatric ROS (+)  Hematology negative hematology ROS (+)   Anesthesia Other Findings   Reproductive/Obstetrics negative OB ROS                             Anesthesia Physical Anesthesia Plan  ASA: II  Anesthesia Plan: General   Post-op Pain Management:    Induction: Intravenous  PONV Risk Score and Plan: 3 and Ondansetron, Dexamethasone, Midazolam and Treatment may vary due to age or medical condition  Airway Management Planned: LMA  Additional Equipment:   Intra-op Plan:   Post-operative Plan: Extubation in OR  Informed Consent: I have reviewed the patients History and Physical, chart, labs and discussed the procedure including the risks, benefits and alternatives for the proposed anesthesia with the patient or authorized representative who has indicated his/her understanding and acceptance.   Dental advisory given  Plan Discussed with: CRNA  Anesthesia Plan Comments:         Anesthesia Quick Evaluation

## 2017-04-20 NOTE — Op Note (Signed)
NAME:  Tony Douglas, Tony Douglas NO.:  [id number]  MEDICAL RECORD NO.:  [id number]  LOCATION:                                 FACILITY:  PHYSICIAN:  [name], M.D.      DATE OF BIRTH:  DATE OF PROCEDURE:  04/20/2017 DATE OF DISCHARGE:                              OPERATIVE REPORT   PREOPERATIVE DIAGNOSIS:  Left carpal tunnel syndrome.  POSTOP DIAGNOSIS:  Left carpal tunnel syndrome.  PROCEDURE:  Left carpal tunnel release.  SURGEON:  [name], M.D.  ASSISTANT:  Tawanna Cooler, PA.  ANESTHESIA:  General.  BLOOD LOSS:  Minimal.  SPECIMENS:  None.  CULTURES:  None.  COMPLICATION:  None.  DRESSINGS:  Sterile compressive, bulky hand dressing, and splint.  TOURNIQUET TIME:  25 minutes.  DESCRIPTION OF PROCEDURE:  The patient was brought to the operating room and after adequate anesthesia had been obtained, tourniquet applied. Prepped and draped in usual sterile fashion.  Exsanguinated with elevation of [place].  Tourniquet inflated to 250 mmHg.  Small longitudinal incision over the carpal tunnel.  Extremely tight with a lot of fluid in the carpal canal.  Under direct visualization, this was opened up and released from the forearm fascia proximally, the palmar arch distally.  Distal branch and motor branch identified, protected, decompressed.  Wound irrigated, closed with nylon, injected Marcaine.  Sterile compressive dressing, bulky hand dressing, and splint applied.  Anesthesia reversed.  Brought to the recovery room.  Tolerated the surgery well.  No complications.     [name], M.D.   ______________________________ [name], M.D.    DFM/MEDQ  D:  04/20/2017  T:  04/20/2017  Job:  [id number]

## 2017-04-20 NOTE — Anesthesia Postprocedure Evaluation (Signed)
Anesthesia Post Note  Patient: Tony Douglas  Procedure(s) Performed: LEFT CARPAL TUNNEL RELEASE (Left Wrist)     Patient location during evaluation: PACU Anesthesia Type: General Level of consciousness: awake and alert Pain management: pain level controlled Vital Signs Assessment: post-procedure vital signs reviewed and stable Respiratory status: spontaneous breathing, nonlabored ventilation, respiratory function stable and patient connected to nasal cannula oxygen Cardiovascular status: blood pressure returned to baseline and stable Postop Assessment: no apparent nausea or vomiting Anesthetic complications: no    Last Vitals:  Vitals:   04/20/17 1300 04/20/17 1322  BP:  135/88  Pulse: 73 72  Resp: 14 16  Temp: (!) 36.4 C 36.6 C  SpO2: 96% 97%    Last Pain:  Vitals:   04/20/17 1322  TempSrc:   PainSc: 0-No pain                 Montez Hageman

## 2017-04-20 NOTE — Interval H&P Note (Signed)
History and Physical Interval Note:  04/20/2017 9:45 AM  Tony Douglas  has presented today for surgery, with the diagnosis of carpal tunnel syndrome, left upper limb G56.02  The various methods of treatment have been discussed with the patient and family. After consideration of risks, benefits and other options for treatment, the patient has consented to  Procedure(s): LEFT CARPAL TUNNEL RELEASE (Left) as a surgical intervention .  The patient's history has been reviewed, patient examined, no change in status, stable for surgery.  I have reviewed the patient's chart and labs.  Questions were answered to the patient's satisfaction.     [name]

## 2017-04-20 NOTE — Anesthesia Procedure Notes (Signed)
Procedure Name: LMA Insertion Performed by: Verita Lamb Pre-anesthesia Checklist: Patient identified, Suction available, Emergency Drugs available, Patient being monitored and Timeout performed Patient Re-evaluated:Patient Re-evaluated prior to induction Preoxygenation: Pre-oxygenation with 100% oxygen Induction Type: IV induction LMA: LMA inserted LMA Size: 4.0 Tube type: Oral Number of attempts: 1 Placement Confirmation: positive ETCO2,  CO2 detector and breath sounds checked- equal and bilateral Tube secured with: Tape Dental Injury: Teeth and Oropharynx as per pre-operative assessment

## 2017-04-20 NOTE — Discharge Instructions (Signed)
Do not remove splint.  Do not get splint wet.  Ice and elevate for pain and swelling   Post Anesthesia Home Care Instructions  Activity: Get plenty of rest for the remainder of the day. A responsible individual must stay with you for 24 hours following the procedure.  For the next 24 hours, DO NOT: -Drive a car -Paediatric nurse -Drink alcoholic beverages -Take any medication unless instructed by your physician -Make any legal decisions or sign important papers.  Meals: Start with liquid foods such as gelatin or soup. Progress to regular foods as tolerated. Avoid greasy, spicy, heavy foods. If nausea and/or vomiting occur, drink only clear liquids until the nausea and/or vomiting subsides. Call your physician if vomiting continues.  Special Instructions/Symptoms: Your throat may feel dry or sore from the anesthesia or the breathing tube placed in your throat during surgery. If this causes discomfort, gargle with warm salt water. The discomfort should disappear within 24 hours.  If you had a scopolamine patch placed behind your ear for the management of post- operative nausea and/or vomiting:  1. The medication in the patch is effective for 72 hours, after which it should be removed.  Wrap patch in a tissue and discard in the trash. Wash hands thoroughly with soap and water. 2. You may remove the patch earlier than 72 hours if you experience unpleasant side effects which may include dry mouth, dizziness or visual disturbances. 3. Avoid touching the patch. Wash your hands with soap and water after contact with the patch.

## 2017-04-21 ENCOUNTER — Encounter (HOSPITAL_BASED_OUTPATIENT_CLINIC_OR_DEPARTMENT_OTHER): Payer: Self-pay | Admitting: Orthopedic Surgery

## 2017-04-28 DIAGNOSIS — I10 Essential (primary) hypertension: Secondary | ICD-10-CM | POA: Diagnosis not present

## 2017-04-28 DIAGNOSIS — G5602 Carpal tunnel syndrome, left upper limb: Secondary | ICD-10-CM | POA: Diagnosis not present

## 2017-04-28 DIAGNOSIS — D519 Vitamin B12 deficiency anemia, unspecified: Secondary | ICD-10-CM | POA: Diagnosis not present

## 2017-04-28 DIAGNOSIS — R5383 Other fatigue: Secondary | ICD-10-CM | POA: Diagnosis not present

## 2017-04-28 DIAGNOSIS — E782 Mixed hyperlipidemia: Secondary | ICD-10-CM | POA: Diagnosis not present

## 2017-04-28 DIAGNOSIS — K210 Gastro-esophageal reflux disease with esophagitis: Secondary | ICD-10-CM | POA: Diagnosis not present

## 2017-04-28 DIAGNOSIS — Z9189 Other specified personal risk factors, not elsewhere classified: Secondary | ICD-10-CM | POA: Diagnosis not present

## 2017-04-28 DIAGNOSIS — J449 Chronic obstructive pulmonary disease, unspecified: Secondary | ICD-10-CM | POA: Diagnosis not present

## 2017-05-01 DIAGNOSIS — Z6833 Body mass index (BMI) 33.0-33.9, adult: Secondary | ICD-10-CM | POA: Diagnosis not present

## 2017-05-01 DIAGNOSIS — F331 Major depressive disorder, recurrent, moderate: Secondary | ICD-10-CM | POA: Diagnosis not present

## 2017-05-01 DIAGNOSIS — N400 Enlarged prostate without lower urinary tract symptoms: Secondary | ICD-10-CM | POA: Diagnosis not present

## 2017-05-01 DIAGNOSIS — K219 Gastro-esophageal reflux disease without esophagitis: Secondary | ICD-10-CM | POA: Diagnosis not present

## 2017-05-01 DIAGNOSIS — J301 Allergic rhinitis due to pollen: Secondary | ICD-10-CM | POA: Diagnosis not present

## 2017-05-01 DIAGNOSIS — E782 Mixed hyperlipidemia: Secondary | ICD-10-CM | POA: Diagnosis not present

## 2017-05-01 DIAGNOSIS — I10 Essential (primary) hypertension: Secondary | ICD-10-CM | POA: Diagnosis not present

## 2017-05-02 NOTE — H&P (Signed)
This is a pleasant 66 year-old gentleman who presents to our clinic today with bilateral hand pain, both equally as bad.  This began as intermittent pain on both sides, which was more or less a constant ache in his hands with numbness, tingling and burning to his thumb, index and long fingers.  About three months ago this became more constant.  No new injury or change in activity.  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  No history of cervical pathology.   Past medical, social and family history reviewed in detail on the patient questionnaire and signed.  Review of systems: As detailed in HPI.  All others reviewed and are negative.   EXAMINATION: Well-developed, well-nourished gentleman in no acute distress.  Alert and oriented x 3.  Examination of his left hand reveals mild thenar atrophy.  Positive Phalen's.  Positive Tinel's.  Decreased grip strength.  Decreased sensation in the median nerve distribution.    X-RAYS: X-rays of his hand are unremarkable.  X-rays of his neck reveal marked degenerative changes at C5-6 and C6-7.   IMPRESSION: Bilateral carpal tunnel syndrome versus less likely cervical spine radiculopathy, despite changes on x-rays.    PLAN: At this point we are going to get EMG studies of Tony Douglas's hands to assess his median nerve.  He will follow up with [place] once this is completed.    Addendum: Tony Douglas comes in for follow up.  Left carpal tunnel release by me one week ago.  Doing well.  No pain.  He still has numbness, but his pain is definitely better and the nerve is starting to wake up.  EXAMINATION: On exam his wounds are benign.  No evidence of infection.    DISPOSITION:  Band-Aid.  Shower.  Splint.  Range of motion.  I will see him next week where we are going to do his right carpal tunnel.  He will call for issues in the interim.

## 2017-05-03 NOTE — Anesthesia Preprocedure Evaluation (Signed)
Anesthesia Evaluation  Patient identified by MRN, date of birth, ID band Patient awake    Reviewed: Allergy & Precautions, NPO status , Patient's Chart, lab work & pertinent test results  History of Anesthesia Complications (+) PONV  Airway Mallampati: II  TM Distance: >3 FB Neck ROM: Full    Dental no notable dental hx.    Pulmonary asthma , former smoker,    Pulmonary exam normal breath sounds clear to auscultation       Cardiovascular hypertension, Normal cardiovascular exam Rhythm:Regular Rate:Normal     Neuro/Psych negative neurological ROS  negative psych ROS   GI/Hepatic Neg liver ROS, GERD  Medicated and Controlled,  Endo/Other  negative endocrine ROS  Renal/GU negative Renal ROS  negative genitourinary   Musculoskeletal negative musculoskeletal ROS (+)   Abdominal   Peds negative pediatric ROS (+)  Hematology negative hematology ROS (+)   Anesthesia Other Findings   Reproductive/Obstetrics negative OB ROS                             Anesthesia Physical  Anesthesia Plan  ASA: II  Anesthesia Plan: General   Post-op Pain Management:    Induction: Intravenous  PONV Risk Score and Plan: 3 and Ondansetron, Dexamethasone, Midazolam and Treatment may vary due to age or medical condition  Airway Management Planned: LMA  Additional Equipment:   Intra-op Plan:   Post-operative Plan: Extubation in OR  Informed Consent: I have reviewed the patients History and Physical, chart, labs and discussed the procedure including the risks, benefits and alternatives for the proposed anesthesia with the patient or authorized representative who has indicated his/her understanding and acceptance.   Dental advisory given  Plan Discussed with: CRNA  Anesthesia Plan Comments:         Anesthesia Quick Evaluation

## 2017-05-04 ENCOUNTER — Ambulatory Visit (HOSPITAL_BASED_OUTPATIENT_CLINIC_OR_DEPARTMENT_OTHER)
Admission: RE | Admit: 2017-05-04 | Discharge: 2017-05-04 | Disposition: A | Discharge status: Home / Self Care | Payer: Medicare Other | Source: Ambulatory Visit | Attending: Orthopedic Surgery | Admitting: Orthopedic Surgery

## 2017-05-04 ENCOUNTER — Ambulatory Visit (HOSPITAL_BASED_OUTPATIENT_CLINIC_OR_DEPARTMENT_OTHER): Payer: Medicare Other | Admitting: Anesthesiology

## 2017-05-04 ENCOUNTER — Encounter (HOSPITAL_BASED_OUTPATIENT_CLINIC_OR_DEPARTMENT_OTHER): Payer: Self-pay | Admitting: Anesthesiology

## 2017-05-04 ENCOUNTER — Encounter (HOSPITAL_BASED_OUTPATIENT_CLINIC_OR_DEPARTMENT_OTHER)
Admission: RE | Disposition: A | Discharge status: Home / Self Care | Payer: Self-pay | Source: Ambulatory Visit | Attending: Orthopedic Surgery

## 2017-05-04 DIAGNOSIS — J45909 Unspecified asthma, uncomplicated: Secondary | ICD-10-CM | POA: Diagnosis not present

## 2017-05-04 DIAGNOSIS — G5601 Carpal tunnel syndrome, right upper limb: Secondary | ICD-10-CM | POA: Diagnosis not present

## 2017-05-04 DIAGNOSIS — K219 Gastro-esophageal reflux disease without esophagitis: Secondary | ICD-10-CM | POA: Diagnosis not present

## 2017-05-04 DIAGNOSIS — Z7951 Long term (current) use of inhaled steroids: Secondary | ICD-10-CM | POA: Insufficient documentation

## 2017-05-04 DIAGNOSIS — I10 Essential (primary) hypertension: Secondary | ICD-10-CM | POA: Diagnosis not present

## 2017-05-04 DIAGNOSIS — Z87891 Personal history of nicotine dependence: Secondary | ICD-10-CM | POA: Insufficient documentation

## 2017-05-04 DIAGNOSIS — Z79891 Long term (current) use of opiate analgesic: Secondary | ICD-10-CM | POA: Diagnosis not present

## 2017-05-04 DIAGNOSIS — Z9889 Other specified postprocedural states: Secondary | ICD-10-CM | POA: Insufficient documentation

## 2017-05-04 DIAGNOSIS — Z79899 Other long term (current) drug therapy: Secondary | ICD-10-CM | POA: Diagnosis not present

## 2017-05-04 HISTORY — PX: CARPAL TUNNEL RELEASE: SHX101

## 2017-05-04 SURGERY — CARPAL TUNNEL RELEASE
Anesthesia: General | Site: Wrist | Laterality: Right

## 2017-05-04 MED ORDER — LIDOCAINE 2% (20 MG/ML) 5 ML SYRINGE
INTRAMUSCULAR | Status: AC
  Filled 2017-05-04: qty 5

## 2017-05-04 MED ORDER — DEXAMETHASONE SODIUM PHOSPHATE 10 MG/ML IJ SOLN
INTRAMUSCULAR | Status: AC
  Filled 2017-05-04: qty 1

## 2017-05-04 MED ORDER — CHLORHEXIDINE GLUCONATE 4 % EX LIQD: 60.0000 mL | Freq: Once | CUTANEOUS | Status: DC

## 2017-05-04 MED ORDER — FENTANYL CITRATE (PF) 100 MCG/2ML IJ SOLN: 25.0000 ug | INTRAMUSCULAR | Status: DC | PRN

## 2017-05-04 MED ORDER — DEXAMETHASONE SODIUM PHOSPHATE 4 MG/ML IJ SOLN
INTRAMUSCULAR | Status: DC | PRN
  Administered 2017-05-04: 10 mg via INTRAVENOUS

## 2017-05-04 MED ORDER — CEFAZOLIN SODIUM-DEXTROSE 2-4 GM/100ML-% IV SOLN
INTRAVENOUS | Status: AC
  Filled 2017-05-04: qty 100

## 2017-05-04 MED ORDER — SCOPOLAMINE 1 MG/3DAYS TD PT72: 1.0000 | MEDICATED_PATCH | Freq: Once | TRANSDERMAL | Status: DC | PRN

## 2017-05-04 MED ORDER — BUPIVACAINE HCL (PF) 0.25 % IJ SOLN
INTRAMUSCULAR | Status: AC
  Filled 2017-05-04: qty 60

## 2017-05-04 MED ORDER — LACTATED RINGERS IV SOLN
INTRAVENOUS | Status: DC
  Administered 2017-05-04: 07:00:00 via INTRAVENOUS

## 2017-05-04 MED ORDER — CEFAZOLIN SODIUM-DEXTROSE 2-4 GM/100ML-% IV SOLN
2.0000 g | INTRAVENOUS | Status: AC
  Administered 2017-05-04: 2 g via INTRAVENOUS

## 2017-05-04 MED ORDER — ONDANSETRON HCL 4 MG/2ML IJ SOLN: 4.0000 mg | Freq: Once | INTRAMUSCULAR | Status: DC | PRN

## 2017-05-04 MED ORDER — OXYCODONE-ACETAMINOPHEN 5-325 MG PO TABS: 1.0000 | ORAL_TABLET | ORAL | 0 refills | Status: DC | PRN

## 2017-05-04 MED ORDER — PROPOFOL 10 MG/ML IV BOLUS
INTRAVENOUS | Status: DC | PRN
Start: 2017-05-04 — End: 2017-05-04
  Administered 2017-05-04: 200 mg via INTRAVENOUS

## 2017-05-04 MED ORDER — LACTATED RINGERS IV SOLN
INTRAVENOUS | Status: DC
Start: 2017-05-04 — End: 2017-05-04

## 2017-05-04 MED ORDER — MIDAZOLAM HCL 2 MG/2ML IJ SOLN
1.0000 mg | INTRAMUSCULAR | Status: DC | PRN
  Administered 2017-05-04: 2 mg via INTRAVENOUS

## 2017-05-04 MED ORDER — FENTANYL CITRATE (PF) 100 MCG/2ML IJ SOLN
50.0000 ug | INTRAMUSCULAR | Status: DC | PRN
  Administered 2017-05-04 (×2): 50 ug via INTRAVENOUS

## 2017-05-04 MED ORDER — BUPIVACAINE HCL (PF) 0.5 % IJ SOLN
INTRAMUSCULAR | Status: AC
  Filled 2017-05-04: qty 30

## 2017-05-04 MED ORDER — PROPOFOL 500 MG/50ML IV EMUL
INTRAVENOUS | Status: AC
Start: 2017-05-04 — End: 2017-05-04
  Filled 2017-05-04: qty 50

## 2017-05-04 MED ORDER — ONDANSETRON HCL 4 MG/2ML IJ SOLN
INTRAMUSCULAR | Status: DC | PRN
  Administered 2017-05-04: 4 mg via INTRAVENOUS

## 2017-05-04 MED ORDER — FENTANYL CITRATE (PF) 100 MCG/2ML IJ SOLN
INTRAMUSCULAR | Status: AC
  Filled 2017-05-04: qty 2

## 2017-05-04 MED ORDER — MEPERIDINE HCL 25 MG/ML IJ SOLN: 6.2500 mg | INTRAMUSCULAR | Status: DC | PRN

## 2017-05-04 MED ORDER — LIDOCAINE 2% (20 MG/ML) 5 ML SYRINGE
INTRAMUSCULAR | Status: DC | PRN
  Administered 2017-05-04: 60 mg via INTRAVENOUS

## 2017-05-04 MED ORDER — BUPIVACAINE HCL (PF) 0.5 % IJ SOLN
INTRAMUSCULAR | Status: DC | PRN
  Administered 2017-05-04: 10 mL

## 2017-05-04 MED ORDER — ONDANSETRON HCL 4 MG/2ML IJ SOLN
INTRAMUSCULAR | Status: AC
  Filled 2017-05-04: qty 2

## 2017-05-04 MED ORDER — ONDANSETRON HCL 4 MG PO TABS: 4.0000 mg | ORAL_TABLET | Freq: Three times a day (TID) | ORAL | 0 refills | Status: DC | PRN

## 2017-05-04 MED ORDER — MIDAZOLAM HCL 2 MG/2ML IJ SOLN
INTRAMUSCULAR | Status: AC
Start: 2017-05-04 — End: 2017-05-04
  Filled 2017-05-04: qty 2

## 2017-05-04 SURGICAL SUPPLY — 39 items
BANDAGE ACE 3X5.8 VEL STRL LF (GAUZE/BANDAGES/DRESSINGS) ×3 IMPLANT
BLADE SURG 15 STRL LF DISP TIS (BLADE) ×1 IMPLANT
BLADE SURG 15 STRL SS (BLADE) ×2
BNDG COHESIVE 3X5 TAN STRL LF (GAUZE/BANDAGES/DRESSINGS) ×3 IMPLANT
BNDG ESMARK 4X9 LF (GAUZE/BANDAGES/DRESSINGS) IMPLANT
CORD BIPOLAR FORCEPS 12FT (ELECTRODE) IMPLANT
COVER BACK TABLE 60X90IN (DRAPES) ×3 IMPLANT
COVER MAYO STAND STRL (DRAPES) ×3 IMPLANT
CUFF TOURNIQUET SINGLE 18IN (TOURNIQUET CUFF) ×3 IMPLANT
DRAPE EXTREMITY T 121X128X90 (DRAPE) ×3 IMPLANT
DRAPE SURG 17X23 STRL (DRAPES) ×3 IMPLANT
DURAPREP 26ML APPLICATOR (WOUND CARE) ×3 IMPLANT
GAUZE SPONGE 4X4 12PLY STRL (GAUZE/BANDAGES/DRESSINGS) ×3 IMPLANT
GAUZE XEROFORM 1X8 LF (GAUZE/BANDAGES/DRESSINGS) ×3 IMPLANT
GLOVE BIO SURGEON STRL SZ 6.5 (GLOVE) ×2 IMPLANT
GLOVE BIO SURGEONS STRL SZ 6.5 (GLOVE) ×1
GLOVE BIOGEL PI IND STRL 7.0 (GLOVE) ×3 IMPLANT
GLOVE BIOGEL PI INDICATOR 7.0 (GLOVE) ×6
GLOVE ECLIPSE 7.0 STRL STRAW (GLOVE) ×3 IMPLANT
GLOVE SURG ORTHO 8.0 STRL STRW (GLOVE) ×3 IMPLANT
GOWN STRL REUS W/ TWL LRG LVL3 (GOWN DISPOSABLE) ×1 IMPLANT
GOWN STRL REUS W/ TWL XL LVL3 (GOWN DISPOSABLE) ×2 IMPLANT
GOWN STRL REUS W/TWL LRG LVL3 (GOWN DISPOSABLE) ×2
GOWN STRL REUS W/TWL XL LVL3 (GOWN DISPOSABLE) ×4
NEEDLE HYPO 25X1 1.5 SAFETY (NEEDLE) ×3 IMPLANT
NS IRRIG 1000ML POUR BTL (IV SOLUTION) ×3 IMPLANT
PACK BASIN DAY SURGERY FS (CUSTOM PROCEDURE TRAY) ×3 IMPLANT
PAD CAST 3X4 CTTN HI CHSV (CAST SUPPLIES) ×2 IMPLANT
PADDING CAST ABS 3INX4YD NS (CAST SUPPLIES) ×2
PADDING CAST ABS COTTON 3X4 (CAST SUPPLIES) ×1 IMPLANT
PADDING CAST COTTON 3X4 STRL (CAST SUPPLIES) ×4
SPLINT PLASTER CAST XFAST 3X15 (CAST SUPPLIES) ×10 IMPLANT
SPLINT PLASTER XTRA FASTSET 3X (CAST SUPPLIES) ×20
STOCKINETTE 4X48 STRL (DRAPES) ×3 IMPLANT
SUT ETHILON 3 0 PS 1 (SUTURE) ×3 IMPLANT
SYR BULB 3OZ (MISCELLANEOUS) ×3 IMPLANT
SYR CONTROL 10ML LL (SYRINGE) ×3 IMPLANT
TOWEL OR 17X24 6PK STRL BLUE (TOWEL DISPOSABLE) ×3 IMPLANT
UNDERPAD 30X30 (UNDERPADS AND DIAPERS) ×3 IMPLANT

## 2017-05-04 NOTE — Op Note (Signed)
NAME:  Tony Douglas, Tony Douglas NO.:  [id number]  MEDICAL RECORD NO.:  [id number]  LOCATION:                                 FACILITY:  PHYSICIAN:  [name], M.D.      DATE OF BIRTH:  DATE OF PROCEDURE:  05/04/2017 DATE OF DISCHARGE:                              OPERATIVE REPORT   PREOPERATIVE DIAGNOSES: 1. Right carpal tunnel syndrome. 2. Status post release of left carpal tunnel.  POSTOPERATIVE DIAGNOSES: 1. Right carpal tunnel syndrome. 2. Status post release of left carpal tunnel.  PROCEDURES:  Open right carpal tunnel release.  Removal of suture, application of Steri-Strips, left wrist.  SURGEON:  [name], MD.  ASSISTANT:  Tawanna Cooler, PA.  ANESTHESIA:  General.  BLOOD LOSS:  Minimal.  SPECIMENS:  None.  CULTURES:  None.  COMPLICATION:  None.  DRESSINGS:  Soft compressive, bulky hand dressing and splint on the right.  TOURNIQUET TIME:  25 minutes.  DESCRIPTION OF PROCEDURE:  The patient was brought to the operating room and after adequate anesthesia had been obtained, tourniquet was applied on the right.  Prepped and draped in usual sterile fashion. Exsanguinated with elevation of Esmarch.  Tourniquet was inflated to 250 mmHg.  Small longitudinal incision over the carpal tunnel.  Under direct visualization, release of the forearm fascia proximally and the palmar arch distally.  Digital branch and motor branch were identified, protected, and decompressed.  Nice complete decompression.  Moderate constriction on nerve.  Wound irrigated, closed with nylon.  Margins were injected with Marcaine.  Sterile compressive dressing applied.  Tourniquet removed.  Bulky hand dressing and splint.  On the left, we then removed the sutures and applied Steri- Strips.  Anesthesia reversed.  Brought to the recovery room.  Tolerated the surgery well.  No complications.     [name], M.D.   ______________________________ [name], M.D.    DFM/MEDQ  D:  05/04/2017  T:  05/04/2017  Job:  [id number]

## 2017-05-04 NOTE — Anesthesia Postprocedure Evaluation (Signed)
Anesthesia Post Note  Patient: Tony Douglas  Procedure(s) Performed: CARPAL TUNNEL RELEASE (Right Wrist)     Patient location during evaluation: PACU Anesthesia Type: General Level of consciousness: awake and alert Pain management: pain level controlled Vital Signs Assessment: post-procedure vital signs reviewed and stable Respiratory status: spontaneous breathing, nonlabored ventilation, respiratory function stable and patient connected to nasal cannula oxygen Cardiovascular status: blood pressure returned to baseline and stable Postop Assessment: no apparent nausea or vomiting Anesthetic complications: no    Last Vitals:  Vitals:   05/04/17 0845 05/04/17 0900  BP: 125/85 130/83  Pulse: 86 78  Resp: 15 14  Temp:    SpO2: 94% 98%    Last Pain:  Vitals:   05/04/17 0845  TempSrc:   PainSc: 0-No pain                 [name]

## 2017-05-04 NOTE — Transfer of Care (Signed)
Immediate Anesthesia Transfer of Care Note  Patient: Tony Douglas  Procedure(s) Performed: CARPAL TUNNEL RELEASE (Right Wrist)  Patient Location: PACU  Anesthesia Type:General  Level of Consciousness: awake and sedated  Airway & Oxygen Therapy: Patient Spontanous Breathing and Patient connected to face mask oxygen  Post-op Assessment: Report given to RN and Post -op Vital signs reviewed and stable  Post vital signs: Reviewed and stable  Last Vitals:  Vitals:   05/04/17 0632  BP: (!) 141/72  Pulse: 97  Resp: 18  Temp: 36.8 C  SpO2: 98%    Last Pain:  Vitals:   05/04/17 0632  TempSrc: Oral  PainSc: 0-No pain      Patients Stated Pain Goal: 0 (16/07/37 1062)  Complications: No apparent anesthesia complications

## 2017-05-04 NOTE — Anesthesia Procedure Notes (Signed)
Procedure Name: LMA Insertion Performed by: Sahid Borba W Pre-anesthesia Checklist: Patient identified, Emergency Drugs available, Suction available and Patient being monitored Patient Re-evaluated:Patient Re-evaluated prior to induction Oxygen Delivery Method: Circle system utilized Preoxygenation: Pre-oxygenation with 100% oxygen Induction Type: IV induction Ventilation: Mask ventilation without difficulty LMA: LMA inserted LMA Size: 4.0 Number of attempts: 1 Placement Confirmation: positive ETCO2 Tube secured with: Tape Dental Injury: Teeth and Oropharynx as per pre-operative assessment        

## 2017-05-04 NOTE — Discharge Instructions (Signed)
Do not remove splint.  Do not get splint wet.  Apply ice and elevate for swelling    Call your surgeon if you experience:   1.  Fever over 101.0. 2.  Inability to urinate. 3.  Nausea and/or vomiting. 4.  Extreme swelling or bruising at the surgical site. 5.  Continued bleeding from the incision. 6.  Increased pain, redness or drainage from the incision. 7.  Problems related to your pain medication. 8.  Any problems and/or concerns    Post Anesthesia Home Care Instructions  Activity: Get plenty of rest for the remainder of the day. A responsible individual must stay with you for 24 hours following the procedure.  For the next 24 hours, DO NOT: -Drive a car -Paediatric nurse -Drink alcoholic beverages -Take any medication unless instructed by your physician -Make any legal decisions or sign important papers.  Meals: Start with liquid foods such as gelatin or soup. Progress to regular foods as tolerated. Avoid greasy, spicy, heavy foods. If nausea and/or vomiting occur, drink only clear liquids until the nausea and/or vomiting subsides. Call your physician if vomiting continues.  Special Instructions/Symptoms: Your throat may feel dry or sore from the anesthesia or the breathing tube placed in your throat during surgery. If this causes discomfort, gargle with warm salt water. The discomfort should disappear within 24 hours.  If you had a scopolamine patch placed behind your ear for the management of post- operative nausea and/or vomiting:  1. The medication in the patch is effective for 72 hours, after which it should be removed.  Wrap patch in a tissue and discard in the trash. Wash hands thoroughly with soap and water. 2. You may remove the patch earlier than 72 hours if you experience unpleasant side effects which may include dry mouth, dizziness or visual disturbances. 3. Avoid touching the patch. Wash your hands with soap and water after contact with the patch.

## 2017-05-04 NOTE — Interval H&P Note (Signed)
History and Physical Interval Note:  05/04/2017 7:36 AM  Tony Douglas  has presented today for surgery, with the diagnosis of RIGHT CARPAL TUNNEL SYNDROME G56.01  The various methods of treatment have been discussed with the patient and family. After consideration of risks, benefits and other options for treatment, the patient has consented to  Procedure(s): CARPAL TUNNEL RELEASE (Right) as a surgical intervention .  The patient's history has been reviewed, patient examined, no change in status, stable for surgery.  I have reviewed the patient's chart and labs.  Questions were answered to the patient's satisfaction.     [name]

## 2017-05-05 ENCOUNTER — Encounter (HOSPITAL_BASED_OUTPATIENT_CLINIC_OR_DEPARTMENT_OTHER): Payer: Self-pay | Admitting: Orthopedic Surgery

## 2017-05-12 DIAGNOSIS — G5601 Carpal tunnel syndrome, right upper limb: Secondary | ICD-10-CM | POA: Diagnosis not present

## 2017-05-19 DIAGNOSIS — G5601 Carpal tunnel syndrome, right upper limb: Secondary | ICD-10-CM | POA: Diagnosis not present

## 2017-05-31 DIAGNOSIS — Z23 Encounter for immunization: Secondary | ICD-10-CM | POA: Diagnosis not present

## 2017-06-13 DIAGNOSIS — J0101 Acute recurrent maxillary sinusitis: Secondary | ICD-10-CM | POA: Diagnosis not present

## 2017-06-13 DIAGNOSIS — Z6834 Body mass index (BMI) 34.0-34.9, adult: Secondary | ICD-10-CM | POA: Diagnosis not present

## 2017-06-13 DIAGNOSIS — R05 Cough: Secondary | ICD-10-CM | POA: Diagnosis not present

## 2017-06-16 DIAGNOSIS — M25551 Pain in right hip: Secondary | ICD-10-CM | POA: Diagnosis not present

## 2017-06-16 DIAGNOSIS — M25561 Pain in right knee: Secondary | ICD-10-CM | POA: Diagnosis not present

## 2017-06-16 DIAGNOSIS — M545 Low back pain: Secondary | ICD-10-CM | POA: Diagnosis not present

## 2017-06-22 DIAGNOSIS — M545 Low back pain: Secondary | ICD-10-CM | POA: Diagnosis not present

## 2017-06-29 DIAGNOSIS — M48061 Spinal stenosis, lumbar region without neurogenic claudication: Secondary | ICD-10-CM | POA: Diagnosis not present

## 2017-06-29 DIAGNOSIS — M545 Low back pain: Secondary | ICD-10-CM | POA: Diagnosis not present

## 2017-06-29 DIAGNOSIS — M5136 Other intervertebral disc degeneration, lumbar region: Secondary | ICD-10-CM | POA: Diagnosis not present

## 2017-10-16 DIAGNOSIS — K210 Gastro-esophageal reflux disease with esophagitis: Secondary | ICD-10-CM | POA: Diagnosis not present

## 2017-10-16 DIAGNOSIS — J449 Chronic obstructive pulmonary disease, unspecified: Secondary | ICD-10-CM | POA: Diagnosis not present

## 2017-10-16 DIAGNOSIS — Z9189 Other specified personal risk factors, not elsewhere classified: Secondary | ICD-10-CM | POA: Diagnosis not present

## 2017-10-16 DIAGNOSIS — I10 Essential (primary) hypertension: Secondary | ICD-10-CM | POA: Diagnosis not present

## 2017-10-16 DIAGNOSIS — Z6833 Body mass index (BMI) 33.0-33.9, adult: Secondary | ICD-10-CM | POA: Diagnosis not present

## 2017-10-16 DIAGNOSIS — F331 Major depressive disorder, recurrent, moderate: Secondary | ICD-10-CM | POA: Diagnosis not present

## 2017-10-16 DIAGNOSIS — Z0001 Encounter for general adult medical examination with abnormal findings: Secondary | ICD-10-CM | POA: Diagnosis not present

## 2017-10-16 DIAGNOSIS — E782 Mixed hyperlipidemia: Secondary | ICD-10-CM | POA: Diagnosis not present

## 2017-10-19 DIAGNOSIS — Z1212 Encounter for screening for malignant neoplasm of rectum: Secondary | ICD-10-CM | POA: Diagnosis not present

## 2017-10-19 DIAGNOSIS — I10 Essential (primary) hypertension: Secondary | ICD-10-CM | POA: Diagnosis not present

## 2017-10-19 DIAGNOSIS — E782 Mixed hyperlipidemia: Secondary | ICD-10-CM | POA: Diagnosis not present

## 2017-10-19 DIAGNOSIS — E6609 Other obesity due to excess calories: Secondary | ICD-10-CM | POA: Diagnosis not present

## 2017-10-19 DIAGNOSIS — N400 Enlarged prostate without lower urinary tract symptoms: Secondary | ICD-10-CM | POA: Diagnosis not present

## 2017-10-19 DIAGNOSIS — F331 Major depressive disorder, recurrent, moderate: Secondary | ICD-10-CM | POA: Diagnosis not present

## 2017-10-19 DIAGNOSIS — Z6833 Body mass index (BMI) 33.0-33.9, adult: Secondary | ICD-10-CM | POA: Diagnosis not present

## 2017-10-19 DIAGNOSIS — Z23 Encounter for immunization: Secondary | ICD-10-CM | POA: Diagnosis not present

## 2017-12-28 DIAGNOSIS — M48061 Spinal stenosis, lumbar region without neurogenic claudication: Secondary | ICD-10-CM | POA: Diagnosis not present

## 2017-12-28 DIAGNOSIS — M5136 Other intervertebral disc degeneration, lumbar region: Secondary | ICD-10-CM | POA: Diagnosis not present

## 2017-12-28 DIAGNOSIS — M545 Low back pain: Secondary | ICD-10-CM | POA: Diagnosis not present

## 2018-01-22 DIAGNOSIS — M545 Low back pain: Secondary | ICD-10-CM | POA: Diagnosis not present

## 2018-01-22 DIAGNOSIS — M5136 Other intervertebral disc degeneration, lumbar region: Secondary | ICD-10-CM | POA: Diagnosis not present

## 2018-01-22 DIAGNOSIS — M48061 Spinal stenosis, lumbar region without neurogenic claudication: Secondary | ICD-10-CM | POA: Diagnosis not present

## 2018-02-09 DIAGNOSIS — M48061 Spinal stenosis, lumbar region without neurogenic claudication: Secondary | ICD-10-CM | POA: Diagnosis not present

## 2018-02-09 DIAGNOSIS — M5136 Other intervertebral disc degeneration, lumbar region: Secondary | ICD-10-CM | POA: Diagnosis not present

## 2018-02-09 DIAGNOSIS — M545 Low back pain: Secondary | ICD-10-CM | POA: Diagnosis not present

## 2018-03-06 DIAGNOSIS — M5136 Other intervertebral disc degeneration, lumbar region: Secondary | ICD-10-CM | POA: Diagnosis not present

## 2018-03-06 DIAGNOSIS — M545 Low back pain: Secondary | ICD-10-CM | POA: Diagnosis not present

## 2018-04-04 DIAGNOSIS — M545 Low back pain: Secondary | ICD-10-CM | POA: Diagnosis not present

## 2018-04-10 DIAGNOSIS — M545 Low back pain: Secondary | ICD-10-CM | POA: Diagnosis not present

## 2018-04-13 DIAGNOSIS — M545 Low back pain: Secondary | ICD-10-CM | POA: Diagnosis not present

## 2018-04-17 DIAGNOSIS — M545 Low back pain: Secondary | ICD-10-CM | POA: Diagnosis not present

## 2018-04-18 DIAGNOSIS — F331 Major depressive disorder, recurrent, moderate: Secondary | ICD-10-CM | POA: Diagnosis not present

## 2018-04-18 DIAGNOSIS — N400 Enlarged prostate without lower urinary tract symptoms: Secondary | ICD-10-CM | POA: Diagnosis not present

## 2018-04-18 DIAGNOSIS — J449 Chronic obstructive pulmonary disease, unspecified: Secondary | ICD-10-CM | POA: Diagnosis not present

## 2018-04-18 DIAGNOSIS — E782 Mixed hyperlipidemia: Secondary | ICD-10-CM | POA: Diagnosis not present

## 2018-04-18 DIAGNOSIS — K219 Gastro-esophageal reflux disease without esophagitis: Secondary | ICD-10-CM | POA: Diagnosis not present

## 2018-04-18 DIAGNOSIS — Z23 Encounter for immunization: Secondary | ICD-10-CM | POA: Diagnosis not present

## 2018-04-18 DIAGNOSIS — Z6832 Body mass index (BMI) 32.0-32.9, adult: Secondary | ICD-10-CM | POA: Diagnosis not present

## 2018-04-18 DIAGNOSIS — I10 Essential (primary) hypertension: Secondary | ICD-10-CM | POA: Diagnosis not present

## 2018-04-20 DIAGNOSIS — M545 Low back pain: Secondary | ICD-10-CM | POA: Diagnosis not present

## 2018-04-24 DIAGNOSIS — M5136 Other intervertebral disc degeneration, lumbar region: Secondary | ICD-10-CM | POA: Diagnosis not present

## 2018-04-24 DIAGNOSIS — M545 Low back pain: Secondary | ICD-10-CM | POA: Diagnosis not present

## 2018-04-24 DIAGNOSIS — M48061 Spinal stenosis, lumbar region without neurogenic claudication: Secondary | ICD-10-CM | POA: Diagnosis not present

## 2018-06-25 DIAGNOSIS — M25551 Pain in right hip: Secondary | ICD-10-CM | POA: Diagnosis not present

## 2018-06-25 DIAGNOSIS — M25561 Pain in right knee: Secondary | ICD-10-CM | POA: Diagnosis not present

## 2018-08-24 DIAGNOSIS — Z6833 Body mass index (BMI) 33.0-33.9, adult: Secondary | ICD-10-CM | POA: Diagnosis not present

## 2018-08-24 DIAGNOSIS — J0101 Acute recurrent maxillary sinusitis: Secondary | ICD-10-CM | POA: Diagnosis not present

## 2018-08-24 DIAGNOSIS — R05 Cough: Secondary | ICD-10-CM | POA: Diagnosis not present

## 2018-10-08 DIAGNOSIS — R1013 Epigastric pain: Secondary | ICD-10-CM | POA: Diagnosis not present

## 2018-10-08 DIAGNOSIS — D171 Benign lipomatous neoplasm of skin and subcutaneous tissue of trunk: Secondary | ICD-10-CM | POA: Diagnosis not present

## 2018-10-08 DIAGNOSIS — Z6833 Body mass index (BMI) 33.0-33.9, adult: Secondary | ICD-10-CM | POA: Diagnosis not present

## 2018-10-11 DIAGNOSIS — K760 Fatty (change of) liver, not elsewhere classified: Secondary | ICD-10-CM | POA: Diagnosis not present

## 2018-10-11 DIAGNOSIS — R1013 Epigastric pain: Secondary | ICD-10-CM | POA: Diagnosis not present

## 2018-10-11 DIAGNOSIS — N200 Calculus of kidney: Secondary | ICD-10-CM | POA: Diagnosis not present

## 2018-10-29 DIAGNOSIS — E782 Mixed hyperlipidemia: Secondary | ICD-10-CM | POA: Diagnosis not present

## 2018-10-29 DIAGNOSIS — K210 Gastro-esophageal reflux disease with esophagitis: Secondary | ICD-10-CM | POA: Diagnosis not present

## 2018-10-29 DIAGNOSIS — J449 Chronic obstructive pulmonary disease, unspecified: Secondary | ICD-10-CM | POA: Diagnosis not present

## 2018-10-29 DIAGNOSIS — R739 Hyperglycemia, unspecified: Secondary | ICD-10-CM | POA: Diagnosis not present

## 2018-10-29 DIAGNOSIS — F331 Major depressive disorder, recurrent, moderate: Secondary | ICD-10-CM | POA: Diagnosis not present

## 2018-10-29 DIAGNOSIS — I10 Essential (primary) hypertension: Secondary | ICD-10-CM | POA: Diagnosis not present

## 2018-11-01 DIAGNOSIS — Z1212 Encounter for screening for malignant neoplasm of rectum: Secondary | ICD-10-CM | POA: Diagnosis not present

## 2018-11-01 DIAGNOSIS — J449 Chronic obstructive pulmonary disease, unspecified: Secondary | ICD-10-CM | POA: Diagnosis not present

## 2018-11-01 DIAGNOSIS — Z6833 Body mass index (BMI) 33.0-33.9, adult: Secondary | ICD-10-CM | POA: Diagnosis not present

## 2018-11-01 DIAGNOSIS — Z0001 Encounter for general adult medical examination with abnormal findings: Secondary | ICD-10-CM | POA: Diagnosis not present

## 2018-11-01 DIAGNOSIS — N400 Enlarged prostate without lower urinary tract symptoms: Secondary | ICD-10-CM | POA: Diagnosis not present

## 2018-11-01 DIAGNOSIS — I10 Essential (primary) hypertension: Secondary | ICD-10-CM | POA: Diagnosis not present

## 2018-11-01 DIAGNOSIS — E782 Mixed hyperlipidemia: Secondary | ICD-10-CM | POA: Diagnosis not present

## 2018-11-01 DIAGNOSIS — M25562 Pain in left knee: Secondary | ICD-10-CM | POA: Diagnosis not present

## 2018-11-13 DIAGNOSIS — S83242D Other tear of medial meniscus, current injury, left knee, subsequent encounter: Secondary | ICD-10-CM | POA: Diagnosis not present

## 2018-12-11 DIAGNOSIS — S83242D Other tear of medial meniscus, current injury, left knee, subsequent encounter: Secondary | ICD-10-CM | POA: Diagnosis not present

## 2018-12-14 DIAGNOSIS — M660 Rupture of popliteal cyst: Secondary | ICD-10-CM | POA: Diagnosis not present

## 2018-12-14 DIAGNOSIS — M25562 Pain in left knee: Secondary | ICD-10-CM | POA: Diagnosis not present

## 2018-12-25 DIAGNOSIS — S83242D Other tear of medial meniscus, current injury, left knee, subsequent encounter: Secondary | ICD-10-CM | POA: Diagnosis not present

## 2018-12-25 DIAGNOSIS — M25562 Pain in left knee: Secondary | ICD-10-CM | POA: Diagnosis not present

## 2018-12-31 ENCOUNTER — Encounter: Payer: Self-pay | Admitting: Physician Assistant

## 2018-12-31 DIAGNOSIS — M1712 Unilateral primary osteoarthritis, left knee: Secondary | ICD-10-CM | POA: Diagnosis present

## 2018-12-31 DIAGNOSIS — S83242D Other tear of medial meniscus, current injury, left knee, subsequent encounter: Secondary | ICD-10-CM | POA: Diagnosis not present

## 2018-12-31 NOTE — H&P (Addendum)
UNI vs TOTAL KNEE ADMISSION H&P  Patient is being admitted for left knee arthroscopy with Unicompartmental vs total knee arthroplasty.  Subjective:  Chief Complaint:left knee pain.  HPI: Tony Douglas, 68 y.o. male, has a history of pain and functional disability in the left knee due to trauma and arthritis and has failed non-surgical conservative treatments for greater than 12 weeks to includeNSAID's and/or analgesics, corticosteriod injections, viscosupplementation injections, use of assistive devices, weight reduction as appropriate and activity modification.  Onset of symptoms was gradual, starting 10 years ago with gradually worsening course since that time. The patient noted prior procedures on the knee to include  arthroscopy and menisectomy on the left knee(s).  Patient currently rates pain in the left knee(s) at 10 out of 10 with activity. Patient has night pain, worsening of pain with activity and weight bearing, pain that interferes with activities of daily living, crepitus and joint swelling.  Patient has evidence of subchondral sclerosis, periarticular osteophytes and joint space narrowing by imaging studies.There is no active infection.  Patient Active Problem List   Diagnosis Date Noted  . Primary localized osteoarthritis of left knee 12/31/2018  . Osteoarthritis of right hip 05/21/2014  . Cough 11/14/2011  . Hypertension 11/14/2011  . Asthma, extrinsic 11/14/2011   Past Medical History:  Diagnosis Date  . Allergic rhinitis   . Anxiety   . BPH (benign prostatic hyperplasia)   . Carpal tunnel syndrome of left wrist   . Complication of anesthesia   . Constipation   . Depression   . Diverticular disease   . Esophageal reflux   . GERD (gastroesophageal reflux disease)   . Hyperlipidemia, mixed   . Hypertension   . Obesity   . Osteoarthritis   . PONV (postoperative nausea and vomiting)   . Primary localized osteoarthritis of left knee 12/31/2018    Past Surgical History:   Procedure Laterality Date  . APPENDECTOMY    . CARPAL TUNNEL RELEASE Left 04/20/2017   Procedure: LEFT CARPAL TUNNEL RELEASE;  Surgeon: [name], MD;  Location: [facility];  Service: Orthopedics;  Laterality: Left;  . CARPAL TUNNEL RELEASE Right 05/04/2017   Procedure: CARPAL TUNNEL RELEASE;  Surgeon: [name], MD;  Location: [facility];  Service: Orthopedics;  Laterality: Right;  . COLECTOMY  2004   laproscopic sigmoid colectomy  . COLONOSCOPY    . HAMMER TOE SURGERY Right    2nd toe  . HERNIA REPAIR Right    inguinal hernia  . SHOULDER SURGERY  2008   right  . TOTAL HIP ARTHROPLASTY Right 05/21/2014   Procedure: RIGHT TOTAL HIP ARTHROPLASTY;  Surgeon: [name], MD;  Location: [facility];  Service: Orthopedics;  Laterality: Right;  . TOTAL KNEE ARTHROPLASTY  2011   right    No current facility-administered medications for this encounter.    Current Outpatient Medications  Medication Sig Dispense Refill Last Dose  . acetaminophen (TYLENOL) 650 MG CR tablet Take 1,300 mg by mouth every 8 (eight) hours as needed for pain.     . budesonide-formoterol (SYMBICORT) 160-4.5 MCG/ACT inhaler Take 2 puffs first thing in am and then another 2 puffs about 12 hours later. (Patient taking differently: Inhale 1 puff into the lungs 2 (two) times a day. ) 1 Inhaler 0   . buPROPion (WELLBUTRIN SR) 150 MG 12 hr tablet Take 150 mg by mouth 2 (two) times daily.     . Cholecalciferol (VITAMIN D) 2000 UNITS CAPS Take 2,000  Units by mouth daily.      Marland Kitchen doxazosin (CARDURA) 4 MG tablet Take 4 mg by mouth at bedtime.     . dutasteride (AVODART) 0.5 MG capsule Take 0.5 mg by mouth at bedtime.      Marland Kitchen EPIPEN 2-PAK 0.3 MG/0.3ML SOAJ injection Inject 0.3 mg into the muscle as needed for anaphylaxis.      Marland Kitchen montelukast (SINGULAIR) 10 MG tablet Take 1 tablet (10 mg total) by mouth at bedtime. 30 tablet 11   . olmesartan-hydrochlorothiazide (BENICAR HCT) 20-12.5 MG per  tablet Take 1 tablet by mouth daily. (Patient taking differently: Take 0.5 tablets by mouth daily. ) 30 tablet 2   . omeprazole (PRILOSEC) 20 MG capsule Take 20 mg by mouth daily.      . simvastatin (ZOCOR) 40 MG tablet Take 40 mg by mouth daily.       Allergies  Allergen Reactions  . Other Swelling and Other (See Comments)    Bees    Social History   Tobacco Use  . Smoking status: Former Smoker    Packs/day: 1.00    Years: 30.00    Pack years: 30.00    Types: Cigarettes    Quit date: 07/04/1992    Years since quitting: 26.5  . Smokeless tobacco: Never Used  Substance Use Topics  . Alcohol use: Yes    Comment: 1 or 2 beers per month    Family History  Problem Relation Age of Onset  . Emphysema Father        smoked and worked in [employer]  . Allergies Brother   . Asthma Brother   . Heart disease Mother      Review of Systems  Constitutional: Negative.   HENT: Negative.   Eyes: Negative.   Respiratory: Negative.   Cardiovascular: Negative.   Gastrointestinal: Negative.   Genitourinary: Negative.   Musculoskeletal: Positive for joint pain.  Skin: Negative.   Neurological: Negative.   Endo/Heme/Allergies: Negative.   Psychiatric/Behavioral: Negative.     Objective:  Physical Exam  Constitutional: He is oriented to person, place, and time. He appears well-developed and well-nourished.  HENT:  Head: Normocephalic and atraumatic.  Mouth/Throat: Oropharynx is clear and moist.  Eyes: Pupils are equal, round, and reactive to light. Conjunctivae are normal.  Neck: Neck supple.  Cardiovascular: Normal rate.  Respiratory: Effort normal.  GI: Soft.  Genitourinary:    Genitourinary Comments: Not pertinent to current symptomatology therefore not examined.   Musculoskeletal:     Comments: Examination of his left knee reveals significant pain medially.  Positive medial McMurray.  1+ synovitis.  Range of motion 0-120 degrees.  Knee is stable with normal patella tracking.   Examination of the right knee reveals previous well healed total knee incision without swelling or pain.  Range of motion 0-120 degrees.  Knee is stable with normal patella tracking.    Neurological: He is alert and oriented to person, place, and time.  Skin: Skin is warm and dry.  Psychiatric: He has a normal mood and affect. His behavior is normal.    Vital signs in last 24 hours: Temp:  [98 F (36.7 C)] 98 F (36.7 C) (06/29 1400) Pulse Rate:  [94] 94 (06/29 1400) BP: (141)/(88) 141/88 (06/29 1400) SpO2:  [96 %] 96 % (06/29 1400) Weight:  [102.2 kg] 102.2 kg (06/29 1400)  Labs:   Estimated body mass index is 32.34 kg/m as calculated from the following:   Height as of this encounter: 5\' 10"  (1.778  m).   Weight as of this encounter: 102.2 kg.   Imaging Review Plain radiographs demonstrate severe degenerative joint disease of the left knee(s). The overall alignment issignificant varus. The bone quality appears to be good for age and reported activity level.      Assessment/Plan:  End stage arthritis, left knee  Principal Problem:   Primary localized osteoarthritis of left knee Active Problems:   Hypertension   Asthma, extrinsic   The patient history, physical examination, clinical judgment of the provider and imaging studies are consistent with end stage degenerative joint disease of the left knee(s) and a knee arthroplasty is deemed medically necessary. The treatment options including medical management, injection therapy arthroscopy and arthroplasty were discussed at length. Arthroscopy will be performed to evaluate all joint compartments thoroughly to determine uni vs. Total.  The risks and benefits of total knee arthroplasty were presented and reviewed. The risks due to aseptic loosening, infection, stiffness, patella tracking problems, thromboembolic complications and other imponderables were discussed. The patient acknowledged the explanation, agreed to proceed with the  plan and consent was signed. Patient is being admitted for inpatient treatment for surgery, pain control, PT, OT, prophylactic antibiotics, VTE prophylaxis, progressive ambulation and ADL's and discharge planning. The patient is planning to be discharged home with home health services     Patient's anticipated LOS is less than 2 midnights, meeting these requirements: - Younger than 69 - Lives within 1 hour of care - Has a competent adult at home to recover with post-op recover - NO history of  - Chronic pain requiring opiods  - Diabetes  - Coronary Artery Disease  - Heart failure  - Heart attack  - Stroke  - DVT/VTE  - Cardiac arrhythmia  - Respiratory Failure/COPD  - Renal failure  - Anemia  - Advanced Liver disease

## 2019-01-02 NOTE — Patient Instructions (Addendum)
YOU NEED TO HAVE A COVID 19 TEST ON_____Thrusday , July 9th ______, THIS TEST MUST BE DONE BEFORE SURGERY, COME TO [place] ENTRANCE. ONCE YOUR COVID TEST IS COMPLETED, PLEASE BEGIN THE QUARANTINE INSTRUCTIONS AS OUTLINED IN YOUR HANDOUT.                Tony Douglas    Your procedure is scheduled on: 01-14-2019  Report to [facility] Main  Entrance    Report to admitting at 7:20 AM      Call this number if you have problems the morning of surgery [phone]     Remember:  [place], NO [place].    Do not eat food After Midnight. YOU MAY HAVE CLEAR LIQUIDS FROM MIDNIGHT UNTIL 6:50AM. At 6:50AM Please finish the prescribed Pre-Surgery ENSURE drink. Nothing by mouth after you finish the ENSURE drink !   CLEAR LIQUID DIET   Foods Allowed                                                                     Foods Excluded  Coffee and tea, regular and decaf                             liquids that you cannot  Plain Jell-O in any flavor                                             see through such as: Fruit ices (not with fruit pulp)                                     milk, soups, orange juice  Iced Popsicles                                    All solid food Carbonated beverages, regular and diet                                    Cranberry, grape and apple juices Sports drinks like Gatorade Lightly seasoned clear broth or consume(fat free) Sugar, honey syrup  Sample Menu Breakfast                                Lunch                                     Supper Cranberry juice                    Beef broth  Chicken broth Jell-O                                     Grape juice                           Apple juice Coffee or tea                        Jell-O                                      Popsicle  Coffee or tea                        Coffee or tea  _____________________________________________________________________    Take these medicines the morning of surgery with A SIP OF WATER: Wellbutrin, simvastatin, omeprazole, Symbicort inhaler (please bring)                                 You may not have any metal on your body including hair pins and              piercings  Do not wear jewelry, make-up, lotions, powders or perfumes, deodorant                        Men may shave face and neck.   Do not bring valuables to the hospital. [place].  Contacts, dentures or bridgework may not be worn into surgery.  Leave suitcase in the car. After surgery it may be brought to your room.     _____________________________________________________________________             [facility] - Preparing for Surgery Before surgery, you can play an important role.  Because skin is not sterile, your skin needs to be as free of germs as possible.  You can reduce the number of germs on your skin by washing with CHG (chlorahexidine gluconate) soap before surgery.  CHG is an antiseptic cleaner which kills germs and bonds with the skin to continue killing germs even after washing. Please DO NOT use if you have an allergy to CHG or antibacterial soaps.  If your skin becomes reddened/irritated stop using the CHG and inform your nurse when you arrive at Short Stay. Do not shave (including legs and underarms) for at least 48 hours prior to the first CHG shower.  You may shave your face/neck. Please follow these instructions carefully:  1.  Shower with CHG Soap the night before surgery and the  morning of Surgery.  2.  If you choose to wash your hair, wash your hair first as usual with your  normal  shampoo.  3.  After you shampoo, rinse your hair and body thoroughly to remove the  shampoo.                           4.  Use CHG as you would any other liquid  soap.  You can apply chg directly  to the skin and  wash                       Gently with a scrungie or clean washcloth.  5.  Apply the CHG Soap to your body ONLY FROM THE NECK DOWN.   Do not use on face/ open                           Wound or open sores. Avoid contact with eyes, ears mouth and genitals (private parts).                       Wash face,  Genitals (private parts) with your normal soap.             6.  Wash thoroughly, paying special attention to the area where your surgery  will be performed.  7.  Thoroughly rinse your body with warm water from the neck down.  8.  DO NOT shower/wash with your normal soap after using and rinsing off  the CHG Soap.                9.  Pat yourself dry with a clean towel.            10.  Wear clean pajamas.            11.  Place clean sheets on your bed the night of your first shower and do not  sleep with pets. Day of Surgery : Do not apply any lotions/deodorants the morning of surgery.  Please wear clean clothes to the hospital/surgery center.  FAILURE TO FOLLOW THESE INSTRUCTIONS MAY RESULT IN THE CANCELLATION OF YOUR SURGERY PATIENT SIGNATURE_________________________________  NURSE SIGNATURE__________________________________  ________________________________________________________________________   Tony Douglas  An incentive spirometer is a tool that can help keep your lungs clear and active. This tool measures how well you are filling your lungs with each breath. Taking long deep breaths may help reverse or decrease the chance of developing breathing (pulmonary) problems (especially infection) following:  A long period of time when you are unable to move or be active. BEFORE THE PROCEDURE   If the spirometer includes an indicator to show your best effort, your nurse or respiratory therapist will set it to a desired goal.  If possible, sit up straight or lean slightly forward. Try not to slouch.  Hold the incentive spirometer  in an upright position. INSTRUCTIONS FOR USE  1. Sit on the edge of your bed if possible, or sit up as far as you can in bed or on a chair. 2. Hold the incentive spirometer in an upright position. 3. Breathe out normally. 4. Place the mouthpiece in your mouth and seal your lips tightly around it. 5. Breathe in slowly and as deeply as possible, raising the piston or the ball toward the top of the column. 6. Hold your breath for 3-5 seconds or for as long as possible. Allow the piston or ball to fall to the bottom of the column. 7. Remove the mouthpiece from your mouth and breathe out normally. 8. Rest for a few seconds and repeat Steps 1 through 7 at least 10 times every 1-2 hours when you are awake. Take your time and take a few normal breaths between deep breaths. 9. The spirometer may include an indicator to show your best effort. Use the indicator as a goal to work toward during each repetition. 10. After each set of 10 deep  breaths, practice coughing to be sure your lungs are clear. If you have an incision (the cut made at the time of surgery), support your incision when coughing by placing a pillow or rolled up towels firmly against it. Once you are able to get out of bed, walk around indoors and cough well. You may stop using the incentive spirometer when instructed by your caregiver.  RISKS AND COMPLICATIONS  Take your time so you do not get dizzy or light-headed.  If you are in pain, you may need to take or ask for pain medication before doing incentive spirometry. It is harder to take a deep breath if you are having pain. AFTER USE  Rest and breathe slowly and easily.  It can be helpful to keep track of a log of your progress. Your caregiver can provide you with a simple table to help with this. If you are using the spirometer at home, follow these instructions: [place] IF:   You are having difficultly using the spirometer.  You have trouble using the spirometer as  often as instructed.  Your pain medication is not giving enough relief while using the spirometer.  You develop fever of 100.5 F (38.1 C) or higher. SEEK IMMEDIATE MEDICAL CARE IF:   You cough up bloody sputum that had not been present before.  You develop fever of 102 F (38.9 C) or greater.  You develop worsening pain at or near the incision site. MAKE SURE YOU:   Understand these instructions.  Will watch your condition.  Will get help right away if you are not doing well or get worse. Document Released: 10/31/2006 Document Revised: 09/12/2011 Document Reviewed: 01/01/2007 [facility] Patient Information 2014 [place], [place].   ________________________________________________________________________

## 2019-01-03 ENCOUNTER — Other Ambulatory Visit: Payer: Self-pay

## 2019-01-03 ENCOUNTER — Encounter (HOSPITAL_COMMUNITY): Payer: Self-pay

## 2019-01-03 ENCOUNTER — Encounter (HOSPITAL_COMMUNITY)
Admission: RE | Admit: 2019-01-03 | Discharge: 2019-01-03 | Disposition: A | Discharge status: Home / Self Care | Payer: Medicare Other | Source: Ambulatory Visit | Attending: Orthopedic Surgery | Admitting: Orthopedic Surgery

## 2019-01-03 DIAGNOSIS — Z01818 Encounter for other preprocedural examination: Secondary | ICD-10-CM | POA: Insufficient documentation

## 2019-01-03 DIAGNOSIS — M1712 Unilateral primary osteoarthritis, left knee: Secondary | ICD-10-CM | POA: Insufficient documentation

## 2019-01-03 LAB — CBC WITH DIFFERENTIAL/PLATELET
Abs Immature Granulocytes: 0.02 10*3/uL (ref 0.00–0.07)
Basophils Absolute: 0.1 10*3/uL (ref 0.0–0.1)
Basophils Relative: 1 %
Eosinophils Absolute: 0.2 10*3/uL (ref 0.0–0.5)
Eosinophils Relative: 3 %
HCT: 40.2 % (ref 39.0–52.0)
Hemoglobin: 13.4 g/dL (ref 13.0–17.0)
Immature Granulocytes: 0 %
Lymphocytes Relative: 23 %
Lymphs Abs: 1.6 10*3/uL (ref 0.7–4.0)
MCH: 30.8 pg (ref 26.0–34.0)
MCHC: 33.3 g/dL (ref 30.0–36.0)
MCV: 92.4 fL (ref 80.0–100.0)
Monocytes Absolute: 0.7 10*3/uL (ref 0.1–1.0)
Monocytes Relative: 10 %
Neutro Abs: 4.4 10*3/uL (ref 1.7–7.7)
Neutrophils Relative %: 63 %
Platelets: 222 10*3/uL (ref 150–400)
RBC: 4.35 MIL/uL (ref 4.22–5.81)
RDW: 12.9 % (ref 11.5–15.5)
WBC: 7 10*3/uL (ref 4.0–10.5)
nRBC: 0 % (ref 0.0–0.2)

## 2019-01-03 LAB — PROTIME-INR
INR: 1 (ref 0.8–1.2)
Prothrombin Time: 13.4 seconds (ref 11.4–15.2)

## 2019-01-03 LAB — SURGICAL PCR SCREEN
MRSA, PCR: NEGATIVE
Staphylococcus aureus: NEGATIVE

## 2019-01-03 LAB — COMPREHENSIVE METABOLIC PANEL
ALT: 38 U/L (ref 0–44)
AST: 31 U/L (ref 15–41)
Albumin: 4.4 g/dL (ref 3.5–5.0)
Alkaline Phosphatase: 50 U/L (ref 38–126)
Anion gap: 9 (ref 5–15)
BUN: 23 mg/dL (ref 8–23)
CO2: 25 mmol/L (ref 22–32)
Calcium: 9.5 mg/dL (ref 8.9–10.3)
Chloride: 106 mmol/L (ref 98–111)
Creatinine, Ser: 1.12 mg/dL (ref 0.61–1.24)
GFR calc Af Amer: >60 mL/min (ref >60)
GFR calc non Af Amer: >60 mL/min (ref >60)
Glucose, Bld: 112 mg/dL — ABNORMAL HIGH (ref 70–99)
Potassium: 3.8 mmol/L (ref 3.5–5.1)
Sodium: 140 mmol/L (ref 135–145)
Total Bilirubin: 0.6 mg/dL (ref 0.3–1.2)
Total Protein: 7.4 g/dL (ref 6.5–8.1)

## 2019-01-03 LAB — APTT: aPTT: 33 seconds (ref 24–36)

## 2019-01-04 LAB — URINE CULTURE: Culture: NO GROWTH

## 2019-01-10 ENCOUNTER — Other Ambulatory Visit (HOSPITAL_COMMUNITY)
Admission: RE | Admit: 2019-01-10 | Discharge: 2019-01-10 | Disposition: A | Discharge status: Home / Self Care | Payer: Medicare Other | Source: Ambulatory Visit | Attending: Orthopedic Surgery | Admitting: Orthopedic Surgery

## 2019-01-10 ENCOUNTER — Other Ambulatory Visit: Payer: Self-pay | Admitting: Orthopedic Surgery

## 2019-01-10 DIAGNOSIS — Z01812 Encounter for preprocedural laboratory examination: Secondary | ICD-10-CM | POA: Insufficient documentation

## 2019-01-10 DIAGNOSIS — Z1159 Encounter for screening for other viral diseases: Secondary | ICD-10-CM | POA: Insufficient documentation

## 2019-01-10 NOTE — Care Plan (Signed)
Spoke with patient prior to surgery. He plans to discharge to home with wife. He will go straight to OPPT. THis has been arranged at [facility] in [place] per his request. He and MD are aware and agreeable with plan.  Choice offered   Ladell Heads, [place]

## 2019-01-11 LAB — SARS CORONAVIRUS 2 (TAT 6-24 HRS): SARS Coronavirus 2: NEGATIVE

## 2019-01-11 NOTE — Progress Notes (Signed)
Pt updated about arrival time for procedure on 01/14/2019. Pt aware to be here at 0530.

## 2019-01-13 MED ORDER — BUPIVACAINE LIPOSOME 1.3 % IJ SUSP
20.0000 mL | INTRAMUSCULAR | Status: DC
  Filled 2019-01-13: qty 20

## 2019-01-13 NOTE — Anesthesia Preprocedure Evaluation (Addendum)
Anesthesia Evaluation  Patient identified by MRN, date of birth, ID band Patient awake    Reviewed: Allergy & Precautions, NPO status , Patient's Chart, lab work & pertinent test results  History of Anesthesia Complications (+) PONV  Airway Mallampati: II  TM Distance: >3 FB Neck ROM: Full    Dental  (+) Dental Advisory Given, Chipped   Pulmonary COPD,  COPD inhaler, former smoker,  01/10/2019 SARS coronavirus NEG   breath sounds clear to auscultation       Cardiovascular hypertension, Pt. on medications (-) angina Rhythm:Regular Rate:Normal     Neuro/Psych Anxiety Depression negative neurological ROS     GI/Hepatic Neg liver ROS, GERD  Medicated and Controlled,  Endo/Other  obese  Renal/GU negative Renal ROS     Musculoskeletal   Abdominal (+) + obese,   Peds  Hematology negative hematology ROS (+)   Anesthesia Other Findings   Reproductive/Obstetrics                            Anesthesia Physical Anesthesia Plan  ASA: III  Anesthesia Plan: General   Post-op Pain Management: GA combined w/ Regional for post-op pain   Induction:   PONV Risk Score and Plan: 1 and Ondansetron, Dexamethasone and Scopolamine patch - Pre-op  Airway Management Planned: Oral ETT  Additional Equipment:   Intra-op Plan:   Post-operative Plan: Extubation in OR  Informed Consent: I have reviewed the patients History and Physical, chart, labs and discussed the procedure including the risks, benefits and alternatives for the proposed anesthesia with the patient or authorized representative who has indicated his/her understanding and acceptance.     Dental advisory given  Plan Discussed with: CRNA and Surgeon  Anesthesia Plan Comments: (Pt refuses SAB)       Anesthesia Quick Evaluation

## 2019-01-14 ENCOUNTER — Other Ambulatory Visit: Payer: Self-pay

## 2019-01-14 ENCOUNTER — Ambulatory Visit (HOSPITAL_COMMUNITY): Payer: Medicare Other | Admitting: Physician Assistant

## 2019-01-14 ENCOUNTER — Encounter (HOSPITAL_COMMUNITY): Payer: Self-pay | Admitting: Emergency Medicine

## 2019-01-14 ENCOUNTER — Encounter (HOSPITAL_COMMUNITY)
Admission: RE | Disposition: A | Discharge status: Home / Self Care | Payer: Self-pay | Source: Home / Self Care | Attending: Orthopedic Surgery

## 2019-01-14 ENCOUNTER — Ambulatory Visit (HOSPITAL_COMMUNITY)
Admission: RE | Admit: 2019-01-14 | Discharge: 2019-01-15 | Disposition: A | Discharge status: Home / Self Care | Payer: Medicare Other | Attending: Orthopedic Surgery | Admitting: Orthopedic Surgery

## 2019-01-14 ENCOUNTER — Ambulatory Visit (HOSPITAL_COMMUNITY): Payer: Medicare Other | Admitting: Anesthesiology

## 2019-01-14 DIAGNOSIS — K219 Gastro-esophageal reflux disease without esophagitis: Secondary | ICD-10-CM | POA: Insufficient documentation

## 2019-01-14 DIAGNOSIS — E669 Obesity, unspecified: Secondary | ICD-10-CM | POA: Diagnosis not present

## 2019-01-14 DIAGNOSIS — J449 Chronic obstructive pulmonary disease, unspecified: Secondary | ICD-10-CM | POA: Insufficient documentation

## 2019-01-14 DIAGNOSIS — Z79899 Other long term (current) drug therapy: Secondary | ICD-10-CM | POA: Diagnosis not present

## 2019-01-14 DIAGNOSIS — Z87891 Personal history of nicotine dependence: Secondary | ICD-10-CM | POA: Insufficient documentation

## 2019-01-14 DIAGNOSIS — Z6831 Body mass index (BMI) 31.0-31.9, adult: Secondary | ICD-10-CM | POA: Insufficient documentation

## 2019-01-14 DIAGNOSIS — Z884 Allergy status to anesthetic agent status: Secondary | ICD-10-CM | POA: Diagnosis not present

## 2019-01-14 DIAGNOSIS — M1712 Unilateral primary osteoarthritis, left knee: Secondary | ICD-10-CM | POA: Insufficient documentation

## 2019-01-14 DIAGNOSIS — N400 Enlarged prostate without lower urinary tract symptoms: Secondary | ICD-10-CM | POA: Diagnosis not present

## 2019-01-14 DIAGNOSIS — F329 Major depressive disorder, single episode, unspecified: Secondary | ICD-10-CM | POA: Insufficient documentation

## 2019-01-14 DIAGNOSIS — Z7951 Long term (current) use of inhaled steroids: Secondary | ICD-10-CM | POA: Diagnosis not present

## 2019-01-14 DIAGNOSIS — M23204 Derangement of unspecified medial meniscus due to old tear or injury, left knee: Secondary | ICD-10-CM | POA: Insufficient documentation

## 2019-01-14 DIAGNOSIS — E782 Mixed hyperlipidemia: Secondary | ICD-10-CM | POA: Insufficient documentation

## 2019-01-14 DIAGNOSIS — I10 Essential (primary) hypertension: Secondary | ICD-10-CM | POA: Insufficient documentation

## 2019-01-14 DIAGNOSIS — S83242A Other tear of medial meniscus, current injury, left knee, initial encounter: Secondary | ICD-10-CM | POA: Diagnosis not present

## 2019-01-14 DIAGNOSIS — G8918 Other acute postprocedural pain: Secondary | ICD-10-CM | POA: Diagnosis not present

## 2019-01-14 DIAGNOSIS — J45909 Unspecified asthma, uncomplicated: Secondary | ICD-10-CM | POA: Diagnosis present

## 2019-01-14 DIAGNOSIS — F418 Other specified anxiety disorders: Secondary | ICD-10-CM | POA: Diagnosis not present

## 2019-01-14 DIAGNOSIS — Z91030 Bee allergy status: Secondary | ICD-10-CM | POA: Insufficient documentation

## 2019-01-14 HISTORY — PX: TOTAL KNEE ARTHROPLASTY: SHX125

## 2019-01-14 HISTORY — PX: KNEE ARTHROSCOPY WITH MEDIAL MENISECTOMY: SHX5651

## 2019-01-14 SURGERY — ARTHROPLASTY, KNEE, TOTAL
Anesthesia: General | Laterality: Left

## 2019-01-14 MED ORDER — PANTOPRAZOLE SODIUM 40 MG PO TBEC
40.0000 mg | DELAYED_RELEASE_TABLET | Freq: Every day | ORAL | Status: DC
  Administered 2019-01-15: 40 mg via ORAL
  Filled 2019-01-14: qty 1

## 2019-01-14 MED ORDER — BUPIVACAINE-EPINEPHRINE (PF) 0.25% -1:200000 IJ SOLN
INTRAMUSCULAR | Status: AC
  Filled 2019-01-14: qty 30

## 2019-01-14 MED ORDER — SODIUM CHLORIDE (PF) 0.9 % IJ SOLN
INTRAMUSCULAR | Status: AC
  Filled 2019-01-14: qty 50

## 2019-01-14 MED ORDER — POLYETHYLENE GLYCOL 3350 17 G PO PACK
17.0000 g | PACK | Freq: Two times a day (BID) | ORAL | Status: DC
  Administered 2019-01-14 – 2019-01-15 (×2): 17 g via ORAL
  Filled 2019-01-14 (×2): qty 1

## 2019-01-14 MED ORDER — FENTANYL CITRATE (PF) 250 MCG/5ML IJ SOLN
INTRAMUSCULAR | Status: DC | PRN
  Administered 2019-01-14 (×4): 50 ug via INTRAVENOUS
  Administered 2019-01-14: 100 ug via INTRAVENOUS
  Administered 2019-01-14: 50 ug via INTRAVENOUS

## 2019-01-14 MED ORDER — SCOPOLAMINE 1 MG/3DAYS TD PT72
1.0000 | MEDICATED_PATCH | TRANSDERMAL | Status: DC
  Administered 2019-01-14: 1.5 mg via TRANSDERMAL

## 2019-01-14 MED ORDER — PHENYLEPHRINE 40 MCG/ML (10ML) SYRINGE FOR IV PUSH (FOR BLOOD PRESSURE SUPPORT)
PREFILLED_SYRINGE | INTRAVENOUS | Status: DC | PRN
  Administered 2019-01-14: 80 ug via INTRAVENOUS
  Administered 2019-01-14: 120 ug via INTRAVENOUS
  Administered 2019-01-14: 160 ug via INTRAVENOUS
  Administered 2019-01-14: 80 ug via INTRAVENOUS
  Administered 2019-01-14 (×2): 120 ug via INTRAVENOUS

## 2019-01-14 MED ORDER — LIDOCAINE 2% (20 MG/ML) 5 ML SYRINGE
INTRAMUSCULAR | Status: DC | PRN
  Administered 2019-01-14: 60 mg via INTRAVENOUS

## 2019-01-14 MED ORDER — TRANEXAMIC ACID-NACL 1000-0.7 MG/100ML-% IV SOLN
1000.0000 mg | Freq: Once | INTRAVENOUS | Status: AC
  Administered 2019-01-14: 15:00:00 1000 mg via INTRAVENOUS
  Filled 2019-01-14: qty 100

## 2019-01-14 MED ORDER — POTASSIUM CHLORIDE IN NACL 20-0.9 MEQ/L-% IV SOLN
INTRAVENOUS | Status: DC
  Administered 2019-01-14 – 2019-01-15 (×2): via INTRAVENOUS
  Filled 2019-01-14 (×2): qty 1000

## 2019-01-14 MED ORDER — ONDANSETRON HCL 4 MG/2ML IJ SOLN
INTRAMUSCULAR | Status: DC | PRN
  Administered 2019-01-14: 4 mg via INTRAVENOUS

## 2019-01-14 MED ORDER — DIPHENHYDRAMINE HCL 12.5 MG/5ML PO ELIX: 12.5000 mg | ORAL_SOLUTION | ORAL | Status: DC | PRN

## 2019-01-14 MED ORDER — BUPIVACAINE LIPOSOME 1.3 % IJ SUSP
INTRAMUSCULAR | Status: DC | PRN
  Administered 2019-01-14: 20 mL

## 2019-01-14 MED ORDER — FENTANYL CITRATE (PF) 100 MCG/2ML IJ SOLN
50.0000 ug | INTRAMUSCULAR | Status: DC
  Administered 2019-01-14: 09:00:00 50 ug via INTRAVENOUS
  Filled 2019-01-14: qty 2

## 2019-01-14 MED ORDER — FENTANYL CITRATE (PF) 100 MCG/2ML IJ SOLN
INTRAMUSCULAR | Status: AC
  Filled 2019-01-14: qty 2

## 2019-01-14 MED ORDER — DEXAMETHASONE SODIUM PHOSPHATE 10 MG/ML IJ SOLN
INTRAMUSCULAR | Status: DC | PRN
  Administered 2019-01-14: 10 mg via INTRAVENOUS

## 2019-01-14 MED ORDER — CHLORHEXIDINE GLUCONATE 4 % EX LIQD: 60.0000 mL | Freq: Once | CUTANEOUS | Status: DC

## 2019-01-14 MED ORDER — MOMETASONE FURO-FORMOTEROL FUM 200-5 MCG/ACT IN AERO
2.0000 | INHALATION_SPRAY | Freq: Two times a day (BID) | RESPIRATORY_TRACT | Status: DC
  Administered 2019-01-14 – 2019-01-15 (×2): 2 via RESPIRATORY_TRACT
  Filled 2019-01-14: qty 8.8

## 2019-01-14 MED ORDER — DEXAMETHASONE SODIUM PHOSPHATE 10 MG/ML IJ SOLN: 8.0000 mg | Freq: Once | INTRAMUSCULAR | Status: DC

## 2019-01-14 MED ORDER — MIDAZOLAM HCL 2 MG/2ML IJ SOLN: 0.5000 mg | Freq: Once | INTRAMUSCULAR | Status: DC | PRN

## 2019-01-14 MED ORDER — PROMETHAZINE HCL 25 MG/ML IJ SOLN: 6.2500 mg | INTRAMUSCULAR | Status: DC | PRN

## 2019-01-14 MED ORDER — EPINEPHRINE PF 1 MG/ML IJ SOLN
INTRAMUSCULAR | Status: AC
  Filled 2019-01-14: qty 2

## 2019-01-14 MED ORDER — CEFAZOLIN SODIUM-DEXTROSE 2-4 GM/100ML-% IV SOLN
2.0000 g | INTRAVENOUS | Status: AC
  Administered 2019-01-14: 2 g via INTRAVENOUS
  Filled 2019-01-14: qty 100

## 2019-01-14 MED ORDER — BUPIVACAINE-EPINEPHRINE 0.25% -1:200000 IJ SOLN
INTRAMUSCULAR | Status: DC | PRN
  Administered 2019-01-14 (×2): 30 mL

## 2019-01-14 MED ORDER — OXYCODONE HCL 5 MG PO TABS
5.0000 mg | ORAL_TABLET | ORAL | Status: DC | PRN
  Administered 2019-01-14: 17:00:00 5 mg via ORAL
  Administered 2019-01-14 – 2019-01-15 (×3): 10 mg via ORAL
  Filled 2019-01-14 (×3): qty 2
  Filled 2019-01-14: qty 1

## 2019-01-14 MED ORDER — PROPOFOL 10 MG/ML IV BOLUS
INTRAVENOUS | Status: AC
  Filled 2019-01-14: qty 40

## 2019-01-14 MED ORDER — MENTHOL 3 MG MT LOZG: 1.0000 | LOZENGE | OROMUCOSAL | Status: DC | PRN

## 2019-01-14 MED ORDER — HYDROMORPHONE HCL 1 MG/ML IJ SOLN: 0.5000 mg | INTRAMUSCULAR | Status: DC | PRN

## 2019-01-14 MED ORDER — CEFAZOLIN SODIUM-DEXTROSE 2-4 GM/100ML-% IV SOLN
2.0000 g | Freq: Four times a day (QID) | INTRAVENOUS | Status: AC
  Administered 2019-01-14 (×2): 2 g via INTRAVENOUS
  Filled 2019-01-14 (×2): qty 100

## 2019-01-14 MED ORDER — ROCURONIUM BROMIDE 10 MG/ML (PF) SYRINGE
PREFILLED_SYRINGE | INTRAVENOUS | Status: AC
  Filled 2019-01-14: qty 10

## 2019-01-14 MED ORDER — PHENOL 1.4 % MT LIQD
1.0000 | OROMUCOSAL | Status: DC | PRN
  Filled 2019-01-14: qty 177

## 2019-01-14 MED ORDER — LACTATED RINGERS IV SOLN
INTRAVENOUS | Status: DC
  Administered 2019-01-14 (×3): via INTRAVENOUS

## 2019-01-14 MED ORDER — MIDAZOLAM HCL 2 MG/2ML IJ SOLN
1.0000 mg | INTRAMUSCULAR | Status: DC
  Administered 2019-01-14: 1 mg via INTRAVENOUS
  Filled 2019-01-14: qty 2

## 2019-01-14 MED ORDER — ONDANSETRON HCL 4 MG PO TABS: 4.0000 mg | ORAL_TABLET | Freq: Four times a day (QID) | ORAL | Status: DC | PRN

## 2019-01-14 MED ORDER — METOCLOPRAMIDE HCL 5 MG/ML IJ SOLN: 5.0000 mg | Freq: Three times a day (TID) | INTRAMUSCULAR | Status: DC | PRN

## 2019-01-14 MED ORDER — MONTELUKAST SODIUM 10 MG PO TABS
10.0000 mg | ORAL_TABLET | Freq: Every day | ORAL | Status: DC
  Administered 2019-01-14: 21:00:00 10 mg via ORAL
  Filled 2019-01-14: qty 1

## 2019-01-14 MED ORDER — BUPROPION HCL ER (SR) 150 MG PO TB12
150.0000 mg | ORAL_TABLET | Freq: Two times a day (BID) | ORAL | Status: DC
  Administered 2019-01-14 – 2019-01-15 (×2): 150 mg via ORAL
  Filled 2019-01-14 (×2): qty 1

## 2019-01-14 MED ORDER — DUTASTERIDE 0.5 MG PO CAPS
0.5000 mg | ORAL_CAPSULE | Freq: Every day | ORAL | Status: DC
  Administered 2019-01-14: 0.5 mg via ORAL
  Filled 2019-01-14: qty 1

## 2019-01-14 MED ORDER — ONDANSETRON HCL 4 MG/2ML IJ SOLN: 4.0000 mg | Freq: Four times a day (QID) | INTRAMUSCULAR | Status: DC | PRN

## 2019-01-14 MED ORDER — DEXAMETHASONE SODIUM PHOSPHATE 10 MG/ML IJ SOLN
INTRAMUSCULAR | Status: AC
  Filled 2019-01-14: qty 1

## 2019-01-14 MED ORDER — SODIUM CHLORIDE 0.9 % IR SOLN
Status: DC | PRN
  Administered 2019-01-14: 1000 mL

## 2019-01-14 MED ORDER — ALUM & MAG HYDROXIDE-SIMETH 200-200-20 MG/5ML PO SUSP: 30.0000 mL | ORAL | Status: DC | PRN

## 2019-01-14 MED ORDER — PROPOFOL 10 MG/ML IV BOLUS
INTRAVENOUS | Status: DC | PRN
  Administered 2019-01-14: 150 mg via INTRAVENOUS

## 2019-01-14 MED ORDER — 0.9 % SODIUM CHLORIDE (POUR BTL) OPTIME
TOPICAL | Status: DC | PRN
  Administered 2019-01-14: 1000 mL

## 2019-01-14 MED ORDER — DOCUSATE SODIUM 100 MG PO CAPS
100.0000 mg | ORAL_CAPSULE | Freq: Two times a day (BID) | ORAL | Status: DC
  Administered 2019-01-14 – 2019-01-15 (×2): 100 mg via ORAL
  Filled 2019-01-14 (×2): qty 1

## 2019-01-14 MED ORDER — TRANEXAMIC ACID-NACL 1000-0.7 MG/100ML-% IV SOLN
1000.0000 mg | INTRAVENOUS | Status: AC
  Administered 2019-01-14: 10:00:00 1000 mg via INTRAVENOUS
  Filled 2019-01-14: qty 100

## 2019-01-14 MED ORDER — POVIDONE-IODINE 10 % EX SWAB: 2.0000 "application " | Freq: Once | CUTANEOUS | Status: DC

## 2019-01-14 MED ORDER — WATER FOR IRRIGATION, STERILE IR SOLN
Status: DC | PRN
  Administered 2019-01-14: 2000 mL

## 2019-01-14 MED ORDER — POVIDONE-IODINE 10 % EX SWAB
2.0000 "application " | Freq: Once | CUTANEOUS | Status: AC
  Administered 2019-01-14: 2 via TOPICAL

## 2019-01-14 MED ORDER — METOCLOPRAMIDE HCL 5 MG PO TABS: 5.0000 mg | ORAL_TABLET | Freq: Three times a day (TID) | ORAL | Status: DC | PRN

## 2019-01-14 MED ORDER — ASPIRIN EC 325 MG PO TBEC
325.0000 mg | DELAYED_RELEASE_TABLET | Freq: Every day | ORAL | Status: DC
  Administered 2019-01-15: 325 mg via ORAL
  Filled 2019-01-14: qty 1

## 2019-01-14 MED ORDER — ACETAMINOPHEN 500 MG PO TABS
1000.0000 mg | ORAL_TABLET | Freq: Four times a day (QID) | ORAL | Status: DC
  Administered 2019-01-14 – 2019-01-15 (×3): 1000 mg via ORAL
  Filled 2019-01-14 (×3): qty 2

## 2019-01-14 MED ORDER — ROPIVACAINE HCL 7.5 MG/ML IJ SOLN
INTRAMUSCULAR | Status: DC | PRN
  Administered 2019-01-14: 20 mL via PERINEURAL

## 2019-01-14 MED ORDER — LIDOCAINE 2% (20 MG/ML) 5 ML SYRINGE
INTRAMUSCULAR | Status: AC
  Filled 2019-01-14: qty 5

## 2019-01-14 MED ORDER — DOXAZOSIN MESYLATE 4 MG PO TABS
4.0000 mg | ORAL_TABLET | Freq: Every day | ORAL | Status: DC
  Administered 2019-01-14: 22:00:00 4 mg via ORAL
  Filled 2019-01-14: qty 1

## 2019-01-14 MED ORDER — DEXAMETHASONE SODIUM PHOSPHATE 10 MG/ML IJ SOLN
10.0000 mg | Freq: Three times a day (TID) | INTRAMUSCULAR | Status: DC
  Administered 2019-01-14 – 2019-01-15 (×3): 10 mg via INTRAVENOUS
  Filled 2019-01-14 (×3): qty 1

## 2019-01-14 MED ORDER — GABAPENTIN 300 MG PO CAPS
300.0000 mg | ORAL_CAPSULE | Freq: Every day | ORAL | Status: DC
  Administered 2019-01-14: 300 mg via ORAL
  Filled 2019-01-14: qty 1

## 2019-01-14 MED ORDER — SCOPOLAMINE 1 MG/3DAYS TD PT72
MEDICATED_PATCH | TRANSDERMAL | Status: AC
  Administered 2019-01-14: 09:00:00 1.5 mg via TRANSDERMAL
  Filled 2019-01-14: qty 1

## 2019-01-14 MED ORDER — SUCCINYLCHOLINE CHLORIDE 200 MG/10ML IV SOSY
PREFILLED_SYRINGE | INTRAVENOUS | Status: DC | PRN
  Administered 2019-01-14: 120 mg via INTRAVENOUS

## 2019-01-14 MED ORDER — SODIUM CHLORIDE 0.9 % IR SOLN
Status: DC | PRN
  Administered 2019-01-14: 3000 mL

## 2019-01-14 MED ORDER — PHENYLEPHRINE 40 MCG/ML (10ML) SYRINGE FOR IV PUSH (FOR BLOOD PRESSURE SUPPORT)
PREFILLED_SYRINGE | INTRAVENOUS | Status: AC
  Filled 2019-01-14: qty 10

## 2019-01-14 MED ORDER — HYDROMORPHONE HCL 1 MG/ML IJ SOLN: 0.2500 mg | INTRAMUSCULAR | Status: DC | PRN

## 2019-01-14 MED ORDER — MIDAZOLAM HCL 2 MG/2ML IJ SOLN
INTRAMUSCULAR | Status: AC
  Filled 2019-01-14: qty 2

## 2019-01-14 MED ORDER — MEPERIDINE HCL 50 MG/ML IJ SOLN: 6.2500 mg | INTRAMUSCULAR | Status: DC | PRN

## 2019-01-14 MED ORDER — SODIUM CHLORIDE 0.9 % IJ SOLN
INTRAMUSCULAR | Status: DC | PRN
  Administered 2019-01-14: 50 mL via INTRAVENOUS

## 2019-01-14 MED ORDER — FENTANYL CITRATE (PF) 250 MCG/5ML IJ SOLN
INTRAMUSCULAR | Status: AC
  Filled 2019-01-14: qty 5

## 2019-01-14 MED ORDER — ONDANSETRON HCL 4 MG/2ML IJ SOLN
INTRAMUSCULAR | Status: AC
  Filled 2019-01-14: qty 2

## 2019-01-14 MED ORDER — MIDAZOLAM HCL 5 MG/5ML IJ SOLN
INTRAMUSCULAR | Status: DC | PRN
  Administered 2019-01-14: 2 mg via INTRAVENOUS

## 2019-01-14 MED ORDER — EPHEDRINE SULFATE-NACL 50-0.9 MG/10ML-% IV SOSY
PREFILLED_SYRINGE | INTRAVENOUS | Status: DC | PRN
  Administered 2019-01-14: 10 mg via INTRAVENOUS

## 2019-01-14 MED ORDER — POVIDONE-IODINE 7.5 % EX SOLN
Freq: Once | CUTANEOUS | Status: AC
  Administered 2019-01-14: 08:00:00 via TOPICAL

## 2019-01-14 SURGICAL SUPPLY — 93 items
ATTUNE MED DOME PAT 38 KNEE (Knees) ×2 IMPLANT
ATTUNE PS FEM LT SZ 6 CEM KNEE (Femur) ×2 IMPLANT
ATTUNE PSRP INSR SZ6 7 KNEE (Insert) ×2 IMPLANT
BAG ZIPLOCK 12X15 (MISCELLANEOUS) ×4 IMPLANT
BANDAGE ACE 6X5 VEL STRL LF (GAUZE/BANDAGES/DRESSINGS) ×2 IMPLANT
BANDAGE ESMARK 6X9 LF (GAUZE/BANDAGES/DRESSINGS) ×2 IMPLANT
BASE TIBIA ATTUNE KNEE SYS SZ6 (Knees) ×1 IMPLANT
BLADE SAGITTAL 25.0X1.19X90 (BLADE) ×2 IMPLANT
BLADE SAW SGTL 13X75X1.27 (BLADE) ×4 IMPLANT
BNDG COHESIVE 4X5 TAN STRL (GAUZE/BANDAGES/DRESSINGS) ×2 IMPLANT
BNDG ELASTIC 6X10 VLCR STRL LF (GAUZE/BANDAGES/DRESSINGS) ×2 IMPLANT
BNDG ESMARK 6X9 LF (GAUZE/BANDAGES/DRESSINGS) ×4
BOWL SMART MIX CTS (DISPOSABLE) ×2 IMPLANT
CEMENT HV SMART SET (Cement) ×4 IMPLANT
CHLORAPREP W/TINT 26 (MISCELLANEOUS) ×6 IMPLANT
COVER SURGICAL LIGHT HANDLE (MISCELLANEOUS) ×4 IMPLANT
COVER WAND RF STERILE (DRAPES) IMPLANT
CUFF TOURN SGL QUICK 34 (TOURNIQUET CUFF) ×1
CUFF TRNQT CYL 34X4.125X (TOURNIQUET CUFF) ×1 IMPLANT
DECANTER SPIKE VIAL GLASS SM (MISCELLANEOUS) ×6 IMPLANT
DISSECTOR  3.8MM X 13CM (MISCELLANEOUS) ×1
DISSECTOR 3.8MM X 13CM (MISCELLANEOUS) ×1 IMPLANT
DRAPE ARTHROSCOPY W/POUCH 114 (DRAPES) ×2 IMPLANT
DRAPE ORTHO SPLIT 77X108 STRL (DRAPES) ×1
DRAPE SHEET LG 3/4 BI-LAMINATE (DRAPES) ×6 IMPLANT
DRAPE SURG ORHT 6 SPLT 77X108 (DRAPES) ×1 IMPLANT
DRAPE U-SHAPE 47X51 STRL (DRAPES) ×4 IMPLANT
DRESSING AQUACEL AG SP 3.5X10 (GAUZE/BANDAGES/DRESSINGS) ×1 IMPLANT
DRSG AQUACEL AG ADV 3.5X10 (GAUZE/BANDAGES/DRESSINGS) ×2 IMPLANT
DRSG AQUACEL AG SP 3.5X10 (GAUZE/BANDAGES/DRESSINGS) ×2
DRSG MEPILEX BORDER 4X4 (GAUZE/BANDAGES/DRESSINGS) ×2 IMPLANT
DURAPREP 26ML APPLICATOR (WOUND CARE) ×2 IMPLANT
ELECT CAUTERY BLADE TIP 2.5 (TIP) ×2
ELECT PENCIL ROCKER SW 15FT (MISCELLANEOUS) ×2 IMPLANT
ELECT REM PT RETURN 15FT ADLT (MISCELLANEOUS) ×2 IMPLANT
ELECTRODE CAUTERY BLDE TIP 2.5 (TIP) ×1 IMPLANT
EXCALIBUR 3.8MM X 13CM (MISCELLANEOUS) ×2 IMPLANT
GAUZE SPONGE 4X4 12PLY STRL (GAUZE/BANDAGES/DRESSINGS) ×2 IMPLANT
GAUZE XEROFORM 1X8 LF (GAUZE/BANDAGES/DRESSINGS) IMPLANT
GLOVE BIO SURGEON STRL SZ7 (GLOVE) ×4 IMPLANT
GLOVE BIOGEL PI IND STRL 7.0 (GLOVE) ×2 IMPLANT
GLOVE BIOGEL PI IND STRL 7.5 (GLOVE) ×2 IMPLANT
GLOVE BIOGEL PI IND STRL 8.5 (GLOVE) ×1 IMPLANT
GLOVE BIOGEL PI INDICATOR 7.0 (GLOVE) ×2
GLOVE BIOGEL PI INDICATOR 7.5 (GLOVE) ×2
GLOVE BIOGEL PI INDICATOR 8.5 (GLOVE) ×1
GLOVE ECLIPSE 8.5 STRL (GLOVE) ×2 IMPLANT
GLOVE SS BIOGEL STRL SZ 7.5 (GLOVE) ×2 IMPLANT
GLOVE SUPERSENSE BIOGEL SZ 7.5 (GLOVE) ×2
GOWN STRL REUS W/ TWL LRG LVL3 (GOWN DISPOSABLE) ×4 IMPLANT
GOWN STRL REUS W/ TWL XL LVL3 (GOWN DISPOSABLE) ×1 IMPLANT
GOWN STRL REUS W/TWL 2XL LVL3 (GOWN DISPOSABLE) ×4 IMPLANT
GOWN STRL REUS W/TWL LRG LVL3 (GOWN DISPOSABLE) ×4
GOWN STRL REUS W/TWL XL LVL3 (GOWN DISPOSABLE) ×1
HANDPIECE INTERPULSE COAX TIP (DISPOSABLE) ×1
HOLDER FOLEY CATH W/STRAP (MISCELLANEOUS) ×2 IMPLANT
HOOD PEEL AWAY FLYTE STAYCOOL (MISCELLANEOUS) ×8 IMPLANT
KIT BASIN OR (CUSTOM PROCEDURE TRAY) ×2 IMPLANT
KIT TURNOVER KIT A (KITS) ×2 IMPLANT
MANIFOLD NEPTUNE II (INSTRUMENTS) ×4 IMPLANT
MARKER SKIN DUAL TIP RULER LAB (MISCELLANEOUS) ×4 IMPLANT
NDL SAFETY ECLIPSE 18X1.5 (NEEDLE) ×2 IMPLANT
NEEDLE HYPO 18GX1.5 SHARP (NEEDLE) ×2
NEEDLE HYPO 22GX1.5 SAFETY (NEEDLE) ×4 IMPLANT
NS IRRIG 1000ML POUR BTL (IV SOLUTION) ×4 IMPLANT
PACK ARTHROSCOPY WL (CUSTOM PROCEDURE TRAY) ×2 IMPLANT
PACK ICE MAXI GEL EZY WRAP (MISCELLANEOUS) IMPLANT
PACK TOTAL KNEE CUSTOM (KITS) ×4 IMPLANT
PAD ALCOHOL SWAB (MISCELLANEOUS) IMPLANT
PIN STEINMAN FIXATION KNEE (PIN) ×2 IMPLANT
PORT APPOLLO RF 90DEGREE MULTI (SURGICAL WAND) IMPLANT
PROTECTOR NERVE ULNAR (MISCELLANEOUS) ×2 IMPLANT
SET HNDPC FAN SPRY TIP SCT (DISPOSABLE) ×1 IMPLANT
STAPLER VISISTAT 35W (STAPLE) IMPLANT
STRIP CLOSURE SKIN 1/2X4 (GAUZE/BANDAGES/DRESSINGS) ×2 IMPLANT
SUT ETHILON 4 0 PS 2 18 (SUTURE) ×2 IMPLANT
SUT MNCRL AB 3-0 PS2 18 (SUTURE) ×4 IMPLANT
SUT PROLENE 3 0 PS 2 (SUTURE) IMPLANT
SUT VIC AB 0 CT1 36 (SUTURE) ×6 IMPLANT
SUT VIC AB 1 CT1 36 (SUTURE) ×4 IMPLANT
SUT VIC AB 2-0 CT1 27 (SUTURE) ×2
SUT VIC AB 2-0 CT1 TAPERPNT 27 (SUTURE) ×2 IMPLANT
SYR 20CC LL (SYRINGE) ×2 IMPLANT
SYR 3ML LL SCALE MARK (SYRINGE) ×2 IMPLANT
SYR CONTROL 10ML LL (SYRINGE) ×8 IMPLANT
TIBIA ATTUNE KNEE SYS BASE SZ6 (Knees) ×2 IMPLANT
TOWEL OR 17X26 10 PK STRL BLUE (TOWEL DISPOSABLE) IMPLANT
TRAY FOLEY MTR SLVR 14FR STAT (SET/KITS/TRAYS/PACK) ×2 IMPLANT
TUBING ARTHROSCOPY IRRIG 16FT (MISCELLANEOUS) ×2 IMPLANT
WATER STERILE IRR 1000ML POUR (IV SOLUTION) ×6 IMPLANT
WRAP KNEE MAXI GEL POST OP (GAUZE/BANDAGES/DRESSINGS) IMPLANT
YANKAUER SUCT BULB TIP 10FT TU (MISCELLANEOUS) ×2 IMPLANT
YANKAUER SUCT BULB TIP NO VENT (SUCTIONS) ×2 IMPLANT

## 2019-01-14 NOTE — Progress Notes (Signed)
Assisted Dr. Carswell Jackson with left, ultrasound guided, adductor canal block. Side rails up, monitors on throughout procedure. See vital signs in flow sheet. Tolerated Procedure well.  

## 2019-01-14 NOTE — Op Note (Signed)
MRN:     [id number] DOB/AGE:    [birth year] / 68 y.o.       OPERATIVE REPORT   DATE OF PROCEDURE:  01/14/2019      PREOPERATIVE DIAGNOSIS:   Primary Localized Osteoarthritis left Knee  Chronic traumatic medial meniscus tear left knee      Estimated body mass index is 31.85 kg/m as calculated from the following:   Height as of this encounter: 5\' 10"  (1.778 m).   Weight as of this encounter: 100.7 kg.                                                       POSTOPERATIVE DIAGNOSIS:   Same                                                                 PROCEDURE:  Procedure(s): TOTAL KNEE ARTHROPLASTY KNEE ARTHROSCOPY WITH MEDIAL MENISECTOMY VERSES A POSSIBLE UNIT ARTHROPLASTY POSSIBLE A TOTAL ARTHROPLASTY Using Depuy Attune RP implants #6 Femur, #6Tibia, 32mm  RP bearing, 38 Patella  Left knee EUA and diagnostic arthroscopy    SURGEON: Mileydi Milsap A. Noemi Chapel, MD   ASSISTANT: Matthew Saras, PA-C, present and scrubbed throughout the case, critical for retraction, instrumentation, and closure.  ANESTHESIA: GET with Adductor Nerve Block  TOURNIQUET TIME: 58 minutes   COMPLICATIONS:  None       SPECIMENS: None   INDICATIONS FOR PROCEDURE: The patient has djd of the knee with varus deformities, XR shows moderate arthritis. MRI shows medial meniscus tear with moderate arhritis. Patient has failed all conservative measures including anti-inflammatory medicines, narcotics, attempts at exercise and weight loss, cortisone injections and viscosupplementation.  Risks and benefits of surgery have been discussed, questions answered.    DESCRIPTION OF PROCEDURE: The patient identified by armband, received right adductor canal block and IV antibiotics, in the holding area at [facility]. Patient taken to the operating room, appropriate anesthetic monitors were attached. GET anesthesia induced with the patient in supine position, In and out catheter was inserted. The left knee was examined and  showed ROM minus 5 to 130 degrees with varus deformity.  Tourniquet applied high to the operative thigh. Lateral post and foot positioner applied to the table, the lower extremity was then prepped and draped in usual sterile fashion from the ankle to the tourniquet. Time-out procedure was performed. The arthroscopy was first performed through a lateral portal the scope was placed. On inspection of the medial compartment, he was found to have 80% grade III and 10% grade IV changes. He had a complex tear of his medial meniscus. The ACL and PCL were normal. The lateral compartment showed 20% grade III chondromalcia. The lateral meniscus was intact. The PF joint showed 75% grade III chondromalcia. Because of these findings I elected to proceed with a TKR, as I did not feel a Uni was indicated. The left leg was reprepped and draped after the arthroscopic portal was sutured with 3-0 nylon. A new time out was called for the TKR. The limb was wrapped with an Esmarch bandage and the tourniquet inflated to 365 mmHg.  We began the operation by making a 6cm anterior midline incision. Small bleeders in the skin and the subcutaneous tissue identified and cauterized. Transverse retinaculum was incised and reflected medially and a medial parapatellar arthrotomy was accomplished. the patella was everted and theprepatellar fat pad resected. The superficial medial collateral ligament was then elevated from anterior to posterior along the proximal flare of the tibia and anterior half of the menisci resected. The knee was hyperflexed exposing bone on bone arthritis. Peripheral and notch osteophytes as well as the cruciate ligaments were then resected. We continued to work our way around posteriorly along the proximal tibia, and externally rotated the tibia subluxing it out from underneath the femur. A McHale retractor was placed through the notch and a lateral Hohmann retractor placed, and an external tibial guide was placed.  The  tibial cutting guide was pinned into place allowing resection of 4 mm of bone medially and about 6 mm of bone laterally because of her varus deformity.   Satisfied with the tibial resection, we then entered the distal femur 2 mm anterior to the PCL origin with the intramedullary guide rod and applied the distal femoral cutting guide set at 58mm, with 5 degrees of valgus. This was pinned along the epicondylar axis. At this point, the distal femoral cut was accomplished without difficulty. We then sized for a 6 femoral component and pinned the guide in 3 degrees of external rotation.The chamfer cutting guide was pinned into place. The anterior, posterior, and chamfer cuts were accomplished without difficulty followed by the  RP box cutting guide and the box cut. We also removed posterior osteophytes from the posterior femoral condyles. At this time, the knee was brought into full extension. We checked our extension and flexion gaps and found them symmetric at 7.  The patella thickness measured at 11m m. We set the cutting guide at 15 and removed the posterior patella sized for 38 button and drilled the lollipop. The knee was then once again hyperflexed exposing the proximal tibia. We sized for a # 6 tibial base plate, applied the smokestack and the conical reamer followed by the the Delta fin keel punch. We then hammered into place the  RP trial femoral component, inserted a trial bearing, trial patellar button, and took the knee through range of motion from 0-130 degrees. No thumb pressure was required for patellar tracking.   At this point, all trial components were removed, a double batch of DePuy HV cement  with Zinacef 1.5gms was mixed and applied to all bony metallic mating surfaces. In order, we hammered into place the tibial tray and removed excess cement, the femoral component and removed excess cement, a 7 mm  RP bearing was inserted, and the knee brought to full extension with compression. The  patellar button was clamped into place, and excess cement removed. While the cement cured the wound was irrigated out with normal saline solution pulse lavage, and exparel was injected throughout the knee. Ligament stability and patellar tracking were checked and found to be excellent..   The parapatellar arthrotomy was closed with  #1 Vicryl suture. The subcutaneous tissue with 0 and 2-0 undyed Vicryl suture, and 4-0 Monocryl.. A dressing of Aquaseal, 4 x 4, dressing sponges, Webril, and Ace wrap applied. Needle and sponge count were correct times 2.The patient awakened, extubated, and taken to recovery room without difficulty. Vascular status was normal, pulses 2+ and symmetric.    Lorn Junes 09/25/2017, 8:56 AM

## 2019-01-14 NOTE — Anesthesia Procedure Notes (Signed)
Procedure Name: Intubation Performed by: Gean Maidens, CRNA Pre-anesthesia Checklist: Patient identified, Emergency Drugs available, Suction available, Patient being monitored and Timeout performed Patient Re-evaluated:Patient Re-evaluated prior to induction Oxygen Delivery Method: Circle system utilized Preoxygenation: Pre-oxygenation with 100% oxygen Induction Type: IV induction Ventilation: Mask ventilation without difficulty Laryngoscope Size: Mac and 4 Grade View: Grade II Tube type: Oral Tube size: 7.5 mm Number of attempts: 1 Airway Equipment and Method: Stylet Placement Confirmation: ETT inserted through vocal cords under direct vision,  positive ETCO2 and CO2 detector Secured at: 23 cm Tube secured with: Tape Dental Injury: Teeth and Oropharynx as per pre-operative assessment

## 2019-01-14 NOTE — Anesthesia Postprocedure Evaluation (Signed)
Anesthesia Post Note  Patient: Tony Douglas  Procedure(s) Performed: TOTAL KNEE ARTHROPLASTY (Left ) KNEE ARTHROSCOPY WITH MEDIAL MENISECTOMY VERSES A POSSIBLE UNIT ARTHROPLASTY POSSIBLE A TOTAL ARTHROPLASTY (Left )     Patient location during evaluation: PACU Anesthesia Type: General Level of consciousness: awake and alert, oriented and patient cooperative Pain management: pain level controlled Vital Signs Assessment: post-procedure vital signs reviewed and stable Respiratory status: spontaneous breathing, nonlabored ventilation, respiratory function stable and patient connected to nasal cannula oxygen Cardiovascular status: blood pressure returned to baseline and stable Postop Assessment: no apparent nausea or vomiting Anesthetic complications: no    Last Vitals:  Vitals:   01/14/19 1227 01/14/19 1230  BP: (!) 158/100 (!) 147/95  Pulse: 96 94  Resp: 14 14  Temp: 36.9 C   SpO2: 98% 98%    Last Pain:  Vitals:   01/14/19 1300  TempSrc:   PainSc: Asleep                 Adel Neyer,E. Mendel Binsfeld

## 2019-01-14 NOTE — Transfer of Care (Signed)
Immediate Anesthesia Transfer of Care Note  Patient: Tony Douglas  Procedure(s) Performed: TOTAL KNEE ARTHROPLASTY (Left ) KNEE ARTHROSCOPY WITH MEDIAL MENISECTOMY VERSES A POSSIBLE UNIT ARTHROPLASTY POSSIBLE A TOTAL ARTHROPLASTY (Left )  Patient Location: PACU  Anesthesia Type:General  Level of Consciousness: sedated, patient cooperative and responds to stimulation  Airway & Oxygen Therapy: Patient Spontanous Breathing and Patient connected to face mask oxygen  Post-op Assessment: Report given to RN and Post -op Vital signs reviewed and stable  Post vital signs: Reviewed and stable  Last Vitals:  Vitals Value Taken Time  BP 141/102 01/14/19 1226  Temp    Pulse 96 01/14/19 1227  Resp 14 01/14/19 1227  SpO2 98 % 01/14/19 1227  Vitals shown include unvalidated device data.  Last Pain:  Vitals:   01/14/19 0856  TempSrc:   PainSc: 0-No pain      Patients Stated Pain Goal: 3 (74/25/52 5894)  Complications: No apparent anesthesia complications

## 2019-01-14 NOTE — Interval H&P Note (Signed)
History and Physical Interval Note:  01/14/2019 8:58 AM  Tony Douglas  has presented today for surgery, with the diagnosis of DEGENERATIVE JOINT  DISEASE  left knee MEDIAL MENISCUS TEAR.  The various methods of treatment have been discussed with the patient and family. After consideration of risks, benefits and other options for treatment, the patient has consented to  Procedure(s): UNICOMPARTMENTAL KNEE (Left) KNEE ARTHROSCOPY WITH MEDIAL MENISECTOMY VERSES A POSSIBLE UNIT ARTHROPLASTY POSSIBLE A TOTAL ARTHROPLASTY (Left) as a surgical intervention.  The patient's history has been reviewed, patient examined, no change in status, stable for surgery.  I have reviewed the patient's chart and labs.  Questions were answered to the patient's satisfaction.     Lorn Junes

## 2019-01-14 NOTE — Anesthesia Procedure Notes (Signed)
Anesthesia Regional Block: Adductor canal block   Pre-Anesthetic Checklist: ,, timeout performed, Correct Patient, Correct Site, Correct Laterality, Correct Procedure, Correct Position, site marked, Risks and benefits discussed,  Surgical consent,  Pre-op evaluation,  At surgeon's request and post-op pain management  Laterality: Left and Lower  Prep: chloraprep       Needles:  Injection technique: Single-shot  Needle Type: Echogenic Needle     Needle Length: 9cm  Needle Gauge: 21     Additional Needles:   Procedures:,,,, ultrasound used (permanent image in chart),,,,  Narrative:  Start time: 01/14/2019 8:35 AM End time: 01/14/2019 8:42 AM Injection made incrementally with aspirations every 5 mL.  Performed by: Personally  Anesthesiologist: Annye Asa, MD  Additional Notes: Pt identified in Holding room.  Monitors applied. Working IV access confirmed. Sterile prep, drape L thigh.  #21ga ECHOgenic needle into adductor canal with US guidance.  20cc 0.75% Ropivacaine injected incrementally after negative test dose.  Patient asymptomatic, VSS, no heme aspirated, tolerated well.  Jenita Seashore, MD

## 2019-01-14 NOTE — Evaluation (Signed)
Physical Therapy Evaluation Patient Details Name: Tony Douglas MRN: [id number] DOB: 07/10/1950 Today's Date: 01/14/2019   History of Present Illness  68 yo male s/p L TKR on 01/14/19. PMH includes OA, HTN, astha, HLD, R THA 2015, R TKR 2011.  Clinical Impression  Pt presents with moderate L knee pain, decreased L knee ROM, increased time and effort to perform mobility tasks, and decreased activity tolerance due to pain. Pt to benefit from acute PT to address deficits. Pt ambulated hallway distance with RW with min guard assist, requiring verbal cuing for form and safety throughout ambulation. Pt educated on ankle pumps (20/hour) to perform this afternoon/evening to increase circulation, to pt's tolerance and limited by pain. PT to progress mobility as tolerated, and will continue to follow acutely.        Follow Up Recommendations Follow surgeon's recommendation for DC plan and follow-up therapies;Supervision for mobility/OOB(OPPT)    Equipment Recommendations  Rolling walker with 5" wheels    Recommendations for Other Services       Precautions / Restrictions Precautions Precautions: Fall Restrictions Weight Bearing Restrictions: No Other Position/Activity Restrictions: WBAT      Mobility  Bed Mobility Overal bed mobility: Needs Assistance Bed Mobility: Supine to Sit     Supine to sit: HOB elevated;Min guard     General bed mobility comments: Min guard for safety, verbal cuing for sequencing to EOB. Increased time and effort.  Transfers Overall transfer level: Needs assistance Equipment used: Rolling walker (2 wheeled) Transfers: Sit to/from Stand Sit to Stand: Min guard;From elevated surface         General transfer comment: Min guard for safety. Verbal cuing for hand placement when rising. Pt steady upon standing.  Ambulation/Gait Ambulation/Gait assistance: Min guard;+2 safety/equipment(chair follow) Gait Distance (Feet): 70 Feet Assistive device: Rolling  walker (2 wheeled) Gait Pattern/deviations: Step-to pattern;Decreased stance time - left;Decreased weight shift to left;Decreased step length - left;Decreased step length - right;Trunk flexed Gait velocity: decr   General Gait Details: Min guard for safety. Verbal cuing for upright posture, sequencing with step-to gait for L knee stability, and placement in RW. Pt with increasing antalgic gait with further ambulation distance.  Stairs            Wheelchair Mobility    Modified Rankin (Stroke Patients Only)       Balance Overall balance assessment: Mild deficits observed, not formally tested                                           Pertinent Vitals/Pain Pain Assessment: 0-10 Pain Score: 5  Pain Location: L knee, during ambulation Pain Descriptors / Indicators: Sore Pain Intervention(s): Limited activity within patient's tolerance;Monitored during session;Premedicated before session;Repositioned;Ice applied    Home Living Family/patient expects to be discharged to:: Private residence Living Arrangements: Spouse/significant other Available Help at Discharge: Family Type of Home: House Home Access: Level entry     Home Layout: One level Home Equipment: Engineer, civil (consulting)      Prior Function Level of Independence: Independent         Comments: Pt reports using knee brace PTA, not using AD     Hand Dominance   Dominant Hand: Right    Extremity/Trunk Assessment   Upper Extremity Assessment Upper Extremity Assessment: Overall WFL for tasks assessed    Lower Extremity Assessment Lower Extremity Assessment: Overall WFL for tasks assessed;LLE  deficits/detail LLE Deficits / Details: suspected post-surgical weakness; able to perform ankle pump, quad set, heel slide 5-90*, and SLR without lift assist or quad lag LLE Sensation: WNL    Cervical / Trunk Assessment Cervical / Trunk Assessment: Normal  Communication   Communication: No  difficulties  Cognition Arousal/Alertness: Awake/alert Behavior During Therapy: WFL for tasks assessed/performed Overall Cognitive Status: Within Functional Limits for tasks assessed                                        General Comments      Exercises     Assessment/Plan    PT Assessment Patient needs continued PT services  PT Problem List Decreased strength;Decreased mobility;Decreased range of motion;Decreased activity tolerance;Decreased balance;Decreased knowledge of use of DME;Pain       PT Treatment Interventions DME instruction;Therapeutic activities;Gait training;Therapeutic exercise;Patient/family education;Balance training;Stair training;Functional mobility training    PT Goals (Current goals can be found in the Care Plan section)  Acute Rehab PT Goals Patient Stated Goal: go home tomorrow am PT Goal Formulation: With patient Time For Goal Achievement: 01/21/19 Potential to Achieve Goals: Good    Frequency 7X/week   Barriers to discharge        Co-evaluation               AM-PAC PT "6 Clicks" Mobility  Outcome Measure Help needed turning from your back to your side while in a flat bed without using bedrails?: A Little Help needed moving from lying on your back to sitting on the side of a flat bed without using bedrails?: A Little Help needed moving to and from a bed to a chair (including a wheelchair)?: A Little Help needed standing up from a chair using your arms (e.g., wheelchair or bedside chair)?: A Little Help needed to walk in hospital room?: A Little Help needed climbing 3-5 steps with a railing? : A Lot 6 Click Score: 17    End of Session Equipment Utilized During Treatment: Gait belt Activity Tolerance: Patient tolerated treatment well;Patient limited by pain Patient left: in chair;with call bell/phone within reach;with chair alarm set;with SCD's reapplied Nurse Communication: Mobility status PT Visit Diagnosis: Other  abnormalities of gait and mobility (R26.89);Difficulty in walking, not elsewhere classified (R26.2)    Time: 0355-9741 PT Time Calculation (min) (ACUTE ONLY): 15 min   Charges:   PT Evaluation $PT Eval Low Complexity: 1 Low         Julien Girt, PT Acute Rehabilitation Services Pager [phone]  Office [phone]   Roxine Caddy D Elonda Husky 01/14/2019, 5:33 PM

## 2019-01-14 NOTE — Care Plan (Signed)
Ortho Bundle Case Management Note  Patient Details  Name: Tony Douglas MRN: [id number] Date of Birth: 12-27-1950   Spoke with patient prior to surgery. He plans to discharge to home with wife. He will go straight to OPPT. THis has been arranged at [facility] in [place] per his request. He and MD are aware and agreeable with plan.  Choice offered                  DME Arranged:  Walker rolling DME Agency:  [employer]  HH Arranged:    [place] Agency:     Additional Comments: Please contact me with any questions of if this plan should need to change.  Ladell Heads,  [place] Specialist  [phone] 01/14/2019, 9:04 AM

## 2019-01-15 ENCOUNTER — Encounter (HOSPITAL_COMMUNITY): Payer: Self-pay | Admitting: Orthopedic Surgery

## 2019-01-15 DIAGNOSIS — M1712 Unilateral primary osteoarthritis, left knee: Secondary | ICD-10-CM | POA: Diagnosis not present

## 2019-01-15 DIAGNOSIS — I10 Essential (primary) hypertension: Secondary | ICD-10-CM | POA: Diagnosis not present

## 2019-01-15 DIAGNOSIS — M23204 Derangement of unspecified medial meniscus due to old tear or injury, left knee: Secondary | ICD-10-CM | POA: Diagnosis not present

## 2019-01-15 DIAGNOSIS — J449 Chronic obstructive pulmonary disease, unspecified: Secondary | ICD-10-CM | POA: Diagnosis not present

## 2019-01-15 DIAGNOSIS — K219 Gastro-esophageal reflux disease without esophagitis: Secondary | ICD-10-CM | POA: Diagnosis not present

## 2019-01-15 DIAGNOSIS — Z7951 Long term (current) use of inhaled steroids: Secondary | ICD-10-CM | POA: Diagnosis not present

## 2019-01-15 LAB — BASIC METABOLIC PANEL
Anion gap: 17 — ABNORMAL HIGH (ref 5–15)
BUN: 19 mg/dL (ref 8–23)
CO2: 17 mmol/L — ABNORMAL LOW (ref 22–32)
Calcium: 9 mg/dL (ref 8.9–10.3)
Chloride: 102 mmol/L (ref 98–111)
Creatinine, Ser: 1.13 mg/dL (ref 0.61–1.24)
GFR calc Af Amer: >60 mL/min (ref >60)
GFR calc non Af Amer: >60 mL/min (ref >60)
Glucose, Bld: 163 mg/dL — ABNORMAL HIGH (ref 70–99)
Potassium: 4 mmol/L (ref 3.5–5.1)
Sodium: 136 mmol/L (ref 135–145)

## 2019-01-15 LAB — CBC
HCT: 37.5 % — ABNORMAL LOW (ref 39.0–52.0)
Hemoglobin: 12.4 g/dL — ABNORMAL LOW (ref 13.0–17.0)
MCH: 31 pg (ref 26.0–34.0)
MCHC: 33.1 g/dL (ref 30.0–36.0)
MCV: 93.8 fL (ref 80.0–100.0)
Platelets: 185 10*3/uL (ref 150–400)
RBC: 4 MIL/uL — ABNORMAL LOW (ref 4.22–5.81)
RDW: 12.7 % (ref 11.5–15.5)
WBC: 11.1 10*3/uL — ABNORMAL HIGH (ref 4.0–10.5)
nRBC: 0 % (ref 0.0–0.2)

## 2019-01-15 MED ORDER — POLYETHYLENE GLYCOL 3350 17 G PO PACK: PACK | ORAL | 0 refills | Status: AC

## 2019-01-15 MED ORDER — GABAPENTIN 300 MG PO CAPS: ORAL_CAPSULE | ORAL | 0 refills | Status: DC

## 2019-01-15 MED ORDER — OXYCODONE HCL 5 MG PO TABS: 5.0000 mg | ORAL_TABLET | ORAL | 0 refills | Status: DC | PRN

## 2019-01-15 MED ORDER — DOCUSATE SODIUM 100 MG PO CAPS: ORAL_CAPSULE | ORAL | 0 refills | Status: DC

## 2019-01-15 MED ORDER — ASPIRIN 325 MG PO TBEC: DELAYED_RELEASE_TABLET | ORAL | 0 refills | Status: DC

## 2019-01-15 NOTE — Discharge Summary (Signed)
Patient ID: Tony Douglas MRN: [id number] DOB/AGE: 1950-09-21 68 y.o.  Admit date: 01/14/2019 Discharge date: 01/15/2019  Admission Diagnoses:  Principal Problem:   Primary localized osteoarthritis of left knee Active Problems:   Hypertension   Asthma, extrinsic   Discharge Diagnoses:  Same  Past Medical History:  Diagnosis Date  . Allergic rhinitis   . Anxiety   . BPH (benign prostatic hyperplasia)   . Carpal tunnel syndrome of left wrist   . Complication of anesthesia   . Constipation   . Depression   . Diverticular disease   . Esophageal reflux   . GERD (gastroesophageal reflux disease)   . Hyperlipidemia, mixed   . Hypertension   . Obesity   . Osteoarthritis   . PONV (postoperative nausea and vomiting)   . Primary localized osteoarthritis of left knee 12/31/2018    Surgeries: Procedure(s): TOTAL KNEE ARTHROPLASTY KNEE ARTHROSCOPY WITH MEDIAL MENISECTOMY VERSES A POSSIBLE UNIT ARTHROPLASTY POSSIBLE A TOTAL ARTHROPLASTY on 01/14/2019   Consultants:   Discharged Condition: Improved  Hospital Course: Tony Douglas is an 68 y.o. male who was admitted 01/14/2019 for operative treatment ofPrimary localized osteoarthritis of left knee. Patient has severe unremitting pain that affects sleep, daily activities, and work/hobbies. After pre-op clearance the patient was taken to the operating room on 01/14/2019 and underwent  Procedure(s): TOTAL KNEE ARTHROPLASTY KNEE ARTHROSCOPY WITH MEDIAL MENISECTOMY VERSES A POSSIBLE UNIT ARTHROPLASTY POSSIBLE A TOTAL ARTHROPLASTY.    Patient was given perioperative antibiotics:  Anti-infectives (From admission, onward)   Start     Dose/Rate Route Frequency Ordered Stop   01/14/19 1600  ceFAZolin (ANCEF) IVPB 2g/100 mL premix     2 g 200 mL/hr over 30 Minutes Intravenous Every 6 hours 01/14/19 1421 01/14/19 2223   01/14/19 0730  ceFAZolin (ANCEF) IVPB 2g/100 mL premix     2 g 200 mL/hr over 30 Minutes Intravenous On call to O.R. 01/14/19  0723 01/14/19 1024       Patient was given sequential compression devices, early ambulation, and chemoprophylaxis to prevent DVT.  Patient benefited maximally from hospital stay and there were no complications.    Recent vital signs:  Patient Vitals for the past 24 hrs:  BP Temp Temp src Pulse Resp SpO2  01/15/19 0521 136/74 97.8 F (36.6 C) - (!) 107 14 95 %  01/15/19 0101 120/76 98.2 F (36.8 C) Oral (!) 118 16 94 %  01/14/19 2101 (!) 149/77 98.2 F (36.8 C) - (!) 116 18 97 %  01/14/19 2017 - - - - - 95 %  01/14/19 1739 (!) 148/95 98.9 F (37.2 C) Oral (!) 112 16 96 %  01/14/19 1708 (!) 143/85 98 F (36.7 C) - (!) 109 16 97 %  01/14/19 1626 (!) 155/95 97.8 F (36.6 C) - (!) 111 18 98 %  01/14/19 1522 (!) 137/91 97.7 F (36.5 C) Oral (!) 106 16 97 %  01/14/19 1411 (!) 152/87 97.6 F (36.4 C) Oral 97 16 100 %  01/14/19 1345 (!) 153/96 97.6 F (36.4 C) - 93 12 100 %  01/14/19 1330 (!) 152/91 - - 93 13 100 %  01/14/19 1315 (!) 153/90 - - 94 18 97 %  01/14/19 1300 (!) 154/102 - - 95 16 100 %  01/14/19 1245 (!) 157/95 - - 98 13 100 %  01/14/19 1230 (!) 147/95 - - 94 14 98 %  01/14/19 1227 (!) 158/100 98.4 F (36.9 C) - 96 14 98 %  01/14/19 0916 122/86 - -  92 17 95 %  01/14/19 0856 (!) 150/93 - - 89 12 97 %  01/14/19 0851 (!) 148/107 - - 99 13 98 %     Recent laboratory studies:  Recent Labs    01/15/19 0305  WBC 11.1*  HGB 12.4*  HCT 37.5*  PLT 185  NA 136  K 4.0  CL 102  CO2 17*  BUN 19  CREATININE 1.13  GLUCOSE 163*  CALCIUM 9.0     Discharge Medications:   Allergies as of 01/15/2019      Reactions   Other Swelling, Other (See Comments)   Bees Anesthesia causes patient to become nauseated      Medication List    STOP taking these medications   olmesartan-hydrochlorothiazide 20-12.5 MG tablet Commonly known as: BENICAR HCT     TAKE these medications   acetaminophen 650 MG CR tablet Commonly known as: TYLENOL Take 1,300 mg by mouth every 8  (eight) hours as needed for pain.   aspirin 325 MG EC tablet 1 tab a day for the next 30 days to prevent blood clots   budesonide-formoterol 160-4.5 MCG/ACT inhaler Commonly known as: Symbicort Take 2 puffs first thing in am and then another 2 puffs about 12 hours later. What changed:   how much to take  how to take this  when to take this  additional instructions   buPROPion 150 MG 12 hr tablet Commonly known as: WELLBUTRIN SR Take 150 mg by mouth 2 (two) times daily.   docusate sodium 100 MG capsule Commonly known as: COLACE 1 tab 2 times a day while on narcotics.  STOOL SOFTENER   doxazosin 4 MG tablet Commonly known as: CARDURA Take 4 mg by mouth at bedtime.   dutasteride 0.5 MG capsule Commonly known as: AVODART Take 0.5 mg by mouth at bedtime.   EpiPen 2-Pak 0.3 mg/0.3 mL Soaj injection Generic drug: EPINEPHrine Inject 0.3 mg into the muscle as needed for anaphylaxis.   gabapentin 300 MG capsule Commonly known as: NEURONTIN 1 tablet before bed for nerve pain   montelukast 10 MG tablet Commonly known as: SINGULAIR Take 1 tablet (10 mg total) by mouth at bedtime.   omeprazole 20 MG capsule Commonly known as: PRILOSEC Take 20 mg by mouth daily.   oxyCODONE 5 MG immediate release tablet Commonly known as: Oxy IR/ROXICODONE Take 1 tablet (5 mg total) by mouth every 4 (four) hours as needed for severe pain.   polyethylene glycol 17 g packet Commonly known as: MIRALAX / GLYCOLAX 17grams in 6 oz of something to drink twice a day until bowel movement.  LAXITIVE.  Restart if two days since last bowel movement   simvastatin 40 MG tablet Commonly known as: ZOCOR Take 40 mg by mouth daily.   Vitamin D 50 MCG (2000 UT) Caps Take 2,000 Units by mouth daily.            Discharge Care Instructions  (From admission, onward)         Start     Ordered   01/15/19 0000  Change dressing    Comments: DO NOT REMOVE BANDAGE OVER SURGICAL INCISION.  [place] WHOLE  LEG INCLUDING OVER THE WATERPROOF BANDAGE WITH SOAP AND WATER EVERY DAY.   01/15/19 0849          Diagnostic Studies: No results found.  Disposition: Discharge disposition: 01-Home or Self Care       Discharge Instructions    CPM   Complete by: As directed  Continuous passive motion machine (CPM):      Use the CPM from 0 to 90 for 6 hours per day.       You may break it up into 2 or 3 sessions per day.      Use CPM for 2 weeks or until you are told to stop.   Call MD / Call 911   Complete by: As directed    If you experience chest pain or shortness of breath, CALL 911 and be transported to the hospital emergency room.  If you develope a fever above 101 F, pus (white drainage) or increased drainage or redness at the wound, or calf pain, call your surgeon's office.   Change dressing   Complete by: As directed    DO NOT REMOVE BANDAGE OVER SURGICAL INCISION.  [place] WHOLE LEG INCLUDING OVER THE WATERPROOF BANDAGE WITH SOAP AND WATER EVERY DAY.   Constipation Prevention   Complete by: As directed    Drink plenty of fluids.  Prune juice may be helpful.  You may use a stool softener, such as Colace (over the counter) 100 mg twice a day.  Use MiraLax (over the counter) for constipation as needed.   Diet - low sodium heart healthy   Complete by: As directed    Discharge instructions   Complete by: As directed    INSTRUCTIONS AFTER JOINT REPLACEMENT   Remove items at home which could result in a fall. This includes throw rugs or furniture in walking pathways ICE to the affected joint every three hours while awake for 30 minutes at a time, for at least the first 3-5 days, and then as needed for pain and swelling.  Continue to use ice for pain and swelling. You may notice swelling that will progress down to the foot and ankle.  This is normal after surgery.  Elevate your leg when you are not up walking on it.   Continue to use the breathing machine you got in the hospital (incentive  spirometer) which will help keep your temperature down.  It is common for your temperature to cycle up and down following surgery, especially at night when you are not up moving around and exerting yourself.  The breathing machine keeps your lungs expanded and your temperature down.   DIET:  As you were doing prior to hospitalization, we recommend a well-balanced diet.  DRESSING / WOUND CARE / SHOWERING  Keep the surgical dressing until follow up.  The dressing is water proof, so you can shower without any extra covering.  IF THE DRESSING FALLS OFF or the wound gets wet inside, change the dressing with sterile gauze.  Please use good hand washing techniques before changing the dressing.  Do not use any lotions or creams on the incision until instructed by your surgeon.    ACTIVITY  Increase activity slowly as tolerated, but follow the weight bearing instructions below.   No driving for 6 weeks or until further direction given by your physician.  You cannot drive while taking narcotics.  No lifting or carrying greater than 10 lbs. until further directed by your surgeon. Avoid periods of inactivity such as sitting longer than an hour when not asleep. This helps prevent blood clots.  You may return to work once you are authorized by your doctor.     WEIGHT BEARING   Weight bearing as tolerated with assist device (walker, cane, etc) as directed, use it as long as suggested by your surgeon or therapist, typically at  least 2-3 weeks.   EXERCISES  Results after joint replacement surgery are often greatly improved when you follow the exercise, range of motion and muscle strengthening exercises prescribed by your doctor. Safety measures are also important to protect the joint from further injury. Any time any of these exercises cause you to have increased pain or swelling, decrease what you are doing until you are comfortable again and then slowly increase them. If you have problems or questions,  call your caregiver or physical therapist for advice.   Rehabilitation is important following a joint replacement. After just a few days of immobilization, the muscles of the leg can become weakened and shrink (atrophy).  These exercises are designed to build up the tone and strength of the thigh and leg muscles and to improve motion. Often times heat used for twenty to thirty minutes before working out will loosen up your tissues and help with improving the range of motion but do not use heat for the first two weeks following surgery (sometimes heat can increase post-operative swelling).   These exercises can be done on a training (exercise) mat, on the floor, on a table or on a bed. Use whatever works the best and is most comfortable for you.    Use music or television while you are exercising so that the exercises are a pleasant break in your day. This will make your life better with the exercises acting as a break in your routine that you can look forward to.   Perform all exercises about fifteen times, three times per day or as directed.  You should exercise both the operative leg and the other leg as well.   Exercises include:  Quad Sets - Tighten up the muscle on the front of the thigh (Quad) and hold for 5-10 seconds.   Straight Leg Raises - With your knee straight (if you were given a brace, keep it on), lift the leg to 60 degrees, hold for 3 seconds, and slowly lower the leg.  Perform this exercise against resistance later as your leg gets stronger.  Leg Slides: Lying on your back, slowly slide your foot toward your buttocks, bending your knee up off the floor (only go as far as is comfortable). Then slowly slide your foot back down until your leg is flat on the floor again.  Angel Wings: Lying on your back spread your legs to the side as far apart as you can without causing discomfort.  Hamstring Strength:  Lying on your back, push your heel against the floor with your leg straight by  tightening up the muscles of your buttocks.  Repeat, but this time bend your knee to a comfortable angle, and push your heel against the floor.  You may put a pillow under the heel to make it more comfortable if necessary.   A rehabilitation program following joint replacement surgery can speed recovery and prevent re-injury in the future due to weakened muscles. Contact your doctor or a physical therapist for more information on knee rehabilitation.    CONSTIPATION  Constipation is defined medically as fewer than three stools per week and severe constipation as less than one stool per week.  Even if you have a regular bowel pattern at home, your normal regimen is likely to be disrupted due to multiple reasons following surgery.  Combination of anesthesia, postoperative narcotics, change in appetite and fluid intake all can affect your bowels.   YOU MUST use at least one of the following options; they  are listed in order of increasing strength to get the job done.  They are all available over the counter, and you may need to use some, POSSIBLY even all of these options:    Drink plenty of fluids (prune juice may be helpful) and high fiber foods Colace 100 mg by mouth twice a day  Senokot for constipation as directed and as needed Dulcolax (bisacodyl), take with full glass of water  Miralax (polyethylene glycol) once or twice a day as needed.  If you have tried all these things and are unable to have a bowel movement in the first 3-4 days after surgery call either your surgeon or your primary doctor.    If you experience loose stools or diarrhea, hold the medications until you stool forms back up.  If your symptoms do not get better within 1 week or if they get worse, check with your doctor.  If you experience "the worst abdominal pain ever" or develop nausea or vomiting, please contact the office immediately for further recommendations for treatment.   ITCHING:  If you experience itching with  your medications, try taking only a single pain pill, or even half a pain pill at a time.  You can also use Benadryl over the counter for itching or also to help with sleep.   TED HOSE STOCKINGS:  Use stockings on both legs until for at least 2 weeks or as directed by physician office. They may be removed at night for sleeping.  MEDICATIONS:  See your medication summary on the "After Visit Summary" that nursing will review with you.  You may have some home medications which will be placed on hold until you complete the course of blood thinner medication.  It is important for you to complete the blood thinner medication as prescribed.  PRECAUTIONS:  If you experience chest pain or shortness of breath - call 911 immediately for transfer to the hospital emergency department.   If you develop a fever greater that 101 F, purulent drainage from wound, increased redness or drainage from wound, foul odor from the wound/dressing, or calf pain - CONTACT YOUR SURGEON.                                                   FOLLOW-UP APPOINTMENTS:  If you do not already have a post-op appointment, please call the office for an appointment to be seen by your surgeon.  Guidelines for how soon to be seen are listed in your "After Visit Summary", but are typically between 1-4 weeks after surgery.  OTHER INSTRUCTIONS:   Knee Replacement:  Do not place pillow under knee, focus on keeping the knee straight while resting. CPM instructions: 0-90 degrees, 2 hours in the morning, 2 hours in the afternoon, and 2 hours in the evening. Place foam block, curve side up under heel at all times except when in CPM or when walking.  DO NOT modify, tear, cut, or change the foam block in any way.  MAKE SURE YOU:  Understand these instructions.  Get help right away if you are not doing well or get worse.    Thank you for letting us be a part of your medical care team.  It is a privilege we respect greatly.  We hope these instructions  will help you stay on track for a fast and full  recovery!   Do not put a pillow under the knee. Place it under the heel.   Complete by: As directed    Place gray foam block, curve side up under heel at all times except when in CPM or when walking.  DO NOT modify, tear, cut, or change in any way the gray foam block.   Increase activity slowly as tolerated   Complete by: As directed    Patient may shower   Complete by: As directed    Aquacel dressing is water proof    Wash over it and the whole leg with soap and water at the end of your shower   TED hose   Complete by: As directed    Use stockings (TED hose) for 2 weeks on both leg(s).  You may remove them at night for sleeping.      Follow-up Information    Elsie Saas, MD Follow up on 01/28/2019.   Specialty: Orthopedic Surgery Why: Your appointment has been scheduled for 3:30  Contact information: [place] [address] [phone]        Practice, [facility]. Go on 01/29/2019.   Specialty: Physical Therapy Why: You are scheduled to start outpatient physical therapy at 1:00. Please arrive a few minutes early to complete paperwork.  This is [place] [employer] [place] information: [place] [address] [phone]        Home, [facility] Follow up.   Specialty: [place] Why: You will be seen at home by [facility] for 5 visits prior to starting Outpatient physical therapy  Contact information: [address] [place] [address] [phone]            Signed: Linda Hedges 01/15/2019, 8:50 AM

## 2019-01-15 NOTE — Care Plan (Signed)
Ortho Bundle Case Management Note  Patient Details  Name: Tony Douglas MRN: [id number] Date of Birth: 26-Nov-1950    Patient converted to a Total knee replacement intraop. Will change discharge plan to home with HHPT and add a CPM to equipment. [facility] has been notified of referral and [employer] will deliver the equipment. I have spoken with wife about the change and she is in agreement. Patient was aware pre-op that plan may change depending on final surgery procedure.  Choice offered.                 DME Arranged:  [facility] DME Agency:  [employer]  HH Arranged:  PT [place] Agency:  [facility] (formerly [facility])  Additional Comments: Please contact me with any questions of if this plan should need to change.  Ladell Heads,  [place] Specialist  [phone] 01/15/2019, 8:34 AM

## 2019-01-15 NOTE — Progress Notes (Signed)
Physical Therapy Treatment Patient Details Name: Tony Douglas MRN: [id number] DOB: 04/17/1951 Today's Date: 01/15/2019    History of Present Illness 68 yo male s/p L TKR on 01/14/19. PMH includes OA, HTN, astha, HLD, R THA 2015, R TKR 2011.    PT Comments    Progressing well with mobility. Reviewed/practiced exercises, gait training, and stair training. All education completed. Okay to d/c from PT standpoint.    Follow Up Recommendations  Follow surgeon's recommendation for DC plan and follow-up therapies     Equipment Recommendations  Rolling walker with 5" wheels;3in1 (PT)    Recommendations for Other Services       Precautions / Restrictions Precautions Precautions: Fall Restrictions Weight Bearing Restrictions: No LLE Weight Bearing: Weight bearing as tolerated    Mobility  Bed Mobility               General bed mobility comments: oob in recliner  Transfers Overall transfer level: Needs assistance Equipment used: Rolling walker (2 wheeled) Transfers: Sit to/from Stand Sit to Stand: Supervision         General transfer comment: for safety  Ambulation/Gait Ambulation/Gait assistance: Supervision Gait Distance (Feet): 200 Feet Assistive device: Rolling walker (2 wheeled) Gait Pattern/deviations: Step-through pattern;Decreased stride length     General Gait Details: for safety.   Stairs Stairs: Yes Stairs assistance: Min guard Stair Management: Step to pattern;Forwards;With walker Number of Stairs: 1 General stair comments: VCs safety, technique, sequence. Close guard for safety   Wheelchair Mobility    Modified Rankin (Stroke Patients Only)       Balance Overall balance assessment: Mild deficits observed, not formally tested                                          Cognition Arousal/Alertness: Awake/alert Behavior During Therapy: WFL for tasks assessed/performed Overall Cognitive Status: Within Functional Limits for  tasks assessed                                        Exercises Total Joint Exercises Ankle Circles/Pumps: AROM;Both;10 reps;Seated Quad Sets: AROM;Both;10 reps;Seated Hip ABduction/ADduction: AROM;Left;10 reps;Seated Straight Leg Raises: AROM;Left;10 reps;Supine Knee Flexion: AAROM;Left;10 reps;Seated Goniometric ROM: ~5-90 degrees    General Comments        Pertinent Vitals/Pain Pain Assessment: 0-10 Pain Score: 5  Pain Location: L knee Pain Descriptors / Indicators: Sore Pain Intervention(s): Monitored during session;Ice applied    Home Living                      Prior Function            PT Goals (current goals can now be found in the care plan section) Progress towards PT goals: Progressing toward goals    Frequency    7X/week      PT Plan Current plan remains appropriate    Co-evaluation              AM-PAC PT "6 Clicks" Mobility   Outcome Measure  Help needed turning from your back to your side while in a flat bed without using bedrails?: A Little Help needed moving from lying on your back to sitting on the side of a flat bed without using bedrails?: A Little Help needed moving to and from a  bed to a chair (including a wheelchair)?: A Little Help needed standing up from a chair using your arms (e.g., wheelchair or bedside chair)?: A Little Help needed to walk in hospital room?: A Little Help needed climbing 3-5 steps with a railing? : A Little 6 Click Score: 18    End of Session Equipment Utilized During Treatment: Gait belt Activity Tolerance: Patient tolerated treatment well Patient left: in chair;with call bell/phone within reach   PT Visit Diagnosis: Other abnormalities of gait and mobility (R26.89);Difficulty in walking, not elsewhere classified (R26.2)     Time: 4128-2081 PT Time Calculation (min) (ACUTE ONLY): 14 min  Charges:  $Gait Training: 8-22 mins                        Weston Anna,  PT Acute Rehabilitation Services Pager: [phone] Office: [phone]

## 2019-01-15 NOTE — Plan of Care (Signed)
Pt ready for DC home 

## 2019-01-17 DIAGNOSIS — F329 Major depressive disorder, single episode, unspecified: Secondary | ICD-10-CM | POA: Diagnosis not present

## 2019-01-17 DIAGNOSIS — Z471 Aftercare following joint replacement surgery: Secondary | ICD-10-CM | POA: Diagnosis not present

## 2019-01-17 DIAGNOSIS — J45909 Unspecified asthma, uncomplicated: Secondary | ICD-10-CM | POA: Diagnosis not present

## 2019-01-17 DIAGNOSIS — F419 Anxiety disorder, unspecified: Secondary | ICD-10-CM | POA: Diagnosis not present

## 2019-01-17 DIAGNOSIS — E782 Mixed hyperlipidemia: Secondary | ICD-10-CM | POA: Diagnosis not present

## 2019-01-17 DIAGNOSIS — K219 Gastro-esophageal reflux disease without esophagitis: Secondary | ICD-10-CM | POA: Diagnosis not present

## 2019-01-17 DIAGNOSIS — I10 Essential (primary) hypertension: Secondary | ICD-10-CM | POA: Diagnosis not present

## 2019-01-17 DIAGNOSIS — Z96653 Presence of artificial knee joint, bilateral: Secondary | ICD-10-CM | POA: Diagnosis not present

## 2019-01-17 DIAGNOSIS — N400 Enlarged prostate without lower urinary tract symptoms: Secondary | ICD-10-CM | POA: Diagnosis not present

## 2019-01-19 DIAGNOSIS — Z471 Aftercare following joint replacement surgery: Secondary | ICD-10-CM | POA: Diagnosis not present

## 2019-01-19 DIAGNOSIS — N400 Enlarged prostate without lower urinary tract symptoms: Secondary | ICD-10-CM | POA: Diagnosis not present

## 2019-01-19 DIAGNOSIS — J45909 Unspecified asthma, uncomplicated: Secondary | ICD-10-CM | POA: Diagnosis not present

## 2019-01-19 DIAGNOSIS — E782 Mixed hyperlipidemia: Secondary | ICD-10-CM | POA: Diagnosis not present

## 2019-01-19 DIAGNOSIS — I10 Essential (primary) hypertension: Secondary | ICD-10-CM | POA: Diagnosis not present

## 2019-01-19 DIAGNOSIS — K219 Gastro-esophageal reflux disease without esophagitis: Secondary | ICD-10-CM | POA: Diagnosis not present

## 2019-01-21 DIAGNOSIS — K219 Gastro-esophageal reflux disease without esophagitis: Secondary | ICD-10-CM | POA: Diagnosis not present

## 2019-01-21 DIAGNOSIS — N400 Enlarged prostate without lower urinary tract symptoms: Secondary | ICD-10-CM | POA: Diagnosis not present

## 2019-01-21 DIAGNOSIS — E782 Mixed hyperlipidemia: Secondary | ICD-10-CM | POA: Diagnosis not present

## 2019-01-21 DIAGNOSIS — I10 Essential (primary) hypertension: Secondary | ICD-10-CM | POA: Diagnosis not present

## 2019-01-21 DIAGNOSIS — Z471 Aftercare following joint replacement surgery: Secondary | ICD-10-CM | POA: Diagnosis not present

## 2019-01-21 DIAGNOSIS — J45909 Unspecified asthma, uncomplicated: Secondary | ICD-10-CM | POA: Diagnosis not present

## 2019-01-23 DIAGNOSIS — I10 Essential (primary) hypertension: Secondary | ICD-10-CM | POA: Diagnosis not present

## 2019-01-23 DIAGNOSIS — E782 Mixed hyperlipidemia: Secondary | ICD-10-CM | POA: Diagnosis not present

## 2019-01-23 DIAGNOSIS — Z471 Aftercare following joint replacement surgery: Secondary | ICD-10-CM | POA: Diagnosis not present

## 2019-01-23 DIAGNOSIS — N400 Enlarged prostate without lower urinary tract symptoms: Secondary | ICD-10-CM | POA: Diagnosis not present

## 2019-01-23 DIAGNOSIS — K219 Gastro-esophageal reflux disease without esophagitis: Secondary | ICD-10-CM | POA: Diagnosis not present

## 2019-01-23 DIAGNOSIS — J45909 Unspecified asthma, uncomplicated: Secondary | ICD-10-CM | POA: Diagnosis not present

## 2019-01-25 DIAGNOSIS — E782 Mixed hyperlipidemia: Secondary | ICD-10-CM | POA: Diagnosis not present

## 2019-01-25 DIAGNOSIS — I10 Essential (primary) hypertension: Secondary | ICD-10-CM | POA: Diagnosis not present

## 2019-01-25 DIAGNOSIS — K219 Gastro-esophageal reflux disease without esophagitis: Secondary | ICD-10-CM | POA: Diagnosis not present

## 2019-01-25 DIAGNOSIS — N400 Enlarged prostate without lower urinary tract symptoms: Secondary | ICD-10-CM | POA: Diagnosis not present

## 2019-01-25 DIAGNOSIS — J45909 Unspecified asthma, uncomplicated: Secondary | ICD-10-CM | POA: Diagnosis not present

## 2019-01-25 DIAGNOSIS — Z471 Aftercare following joint replacement surgery: Secondary | ICD-10-CM | POA: Diagnosis not present

## 2019-01-28 DIAGNOSIS — M1712 Unilateral primary osteoarthritis, left knee: Secondary | ICD-10-CM | POA: Diagnosis not present

## 2019-01-29 DIAGNOSIS — R2689 Other abnormalities of gait and mobility: Secondary | ICD-10-CM | POA: Diagnosis not present

## 2019-01-29 DIAGNOSIS — M1712 Unilateral primary osteoarthritis, left knee: Secondary | ICD-10-CM | POA: Diagnosis not present

## 2019-02-05 DIAGNOSIS — M1712 Unilateral primary osteoarthritis, left knee: Secondary | ICD-10-CM | POA: Diagnosis not present

## 2019-02-05 DIAGNOSIS — R2689 Other abnormalities of gait and mobility: Secondary | ICD-10-CM | POA: Diagnosis not present

## 2019-02-06 DIAGNOSIS — M1712 Unilateral primary osteoarthritis, left knee: Secondary | ICD-10-CM | POA: Diagnosis not present

## 2019-02-06 DIAGNOSIS — R2689 Other abnormalities of gait and mobility: Secondary | ICD-10-CM | POA: Diagnosis not present

## 2019-02-08 DIAGNOSIS — R2689 Other abnormalities of gait and mobility: Secondary | ICD-10-CM | POA: Diagnosis not present

## 2019-02-08 DIAGNOSIS — M1712 Unilateral primary osteoarthritis, left knee: Secondary | ICD-10-CM | POA: Diagnosis not present

## 2019-02-11 ENCOUNTER — Emergency Department (HOSPITAL_COMMUNITY): Payer: Medicare Other

## 2019-02-11 ENCOUNTER — Other Ambulatory Visit: Payer: Self-pay

## 2019-02-11 ENCOUNTER — Emergency Department (HOSPITAL_COMMUNITY)
Admission: EM | Admit: 2019-02-11 | Discharge: 2019-02-11 | Disposition: A | Discharge status: Home / Self Care | Payer: Medicare Other | Attending: Emergency Medicine | Admitting: Emergency Medicine

## 2019-02-11 ENCOUNTER — Encounter (HOSPITAL_COMMUNITY): Payer: Self-pay | Admitting: Emergency Medicine

## 2019-02-11 DIAGNOSIS — Z96641 Presence of right artificial hip joint: Secondary | ICD-10-CM | POA: Diagnosis not present

## 2019-02-11 DIAGNOSIS — Z87891 Personal history of nicotine dependence: Secondary | ICD-10-CM | POA: Insufficient documentation

## 2019-02-11 DIAGNOSIS — Z7982 Long term (current) use of aspirin: Secondary | ICD-10-CM | POA: Insufficient documentation

## 2019-02-11 DIAGNOSIS — Z79899 Other long term (current) drug therapy: Secondary | ICD-10-CM | POA: Insufficient documentation

## 2019-02-11 DIAGNOSIS — R339 Retention of urine, unspecified: Secondary | ICD-10-CM | POA: Diagnosis present

## 2019-02-11 DIAGNOSIS — Z96652 Presence of left artificial knee joint: Secondary | ICD-10-CM | POA: Insufficient documentation

## 2019-02-11 DIAGNOSIS — I10 Essential (primary) hypertension: Secondary | ICD-10-CM | POA: Insufficient documentation

## 2019-02-11 DIAGNOSIS — M1712 Unilateral primary osteoarthritis, left knee: Secondary | ICD-10-CM | POA: Diagnosis not present

## 2019-02-11 DIAGNOSIS — N132 Hydronephrosis with renal and ureteral calculous obstruction: Secondary | ICD-10-CM | POA: Diagnosis not present

## 2019-02-11 DIAGNOSIS — N200 Calculus of kidney: Secondary | ICD-10-CM

## 2019-02-11 DIAGNOSIS — R2689 Other abnormalities of gait and mobility: Secondary | ICD-10-CM | POA: Diagnosis not present

## 2019-02-11 LAB — BASIC METABOLIC PANEL
Anion gap: 11 (ref 5–15)
BUN: 19 mg/dL (ref 8–23)
CO2: 24 mmol/L (ref 22–32)
Calcium: 9.8 mg/dL (ref 8.9–10.3)
Chloride: 102 mmol/L (ref 98–111)
Creatinine, Ser: 1.23 mg/dL (ref 0.61–1.24)
GFR calc Af Amer: >60 mL/min (ref >60)
GFR calc non Af Amer: 60 mL/min — ABNORMAL LOW (ref >60)
Glucose, Bld: 105 mg/dL — ABNORMAL HIGH (ref 70–99)
Potassium: 3.6 mmol/L (ref 3.5–5.1)
Sodium: 137 mmol/L (ref 135–145)

## 2019-02-11 LAB — URINALYSIS, ROUTINE W REFLEX MICROSCOPIC
Bilirubin Urine: NEGATIVE
Glucose, UA: NEGATIVE mg/dL
Ketones, ur: NEGATIVE mg/dL
Leukocytes,Ua: NEGATIVE
Nitrite: NEGATIVE
Protein, ur: NEGATIVE mg/dL
RBC / HPF: >50 RBC/hpf — ABNORMAL HIGH (ref 0–5)
Specific Gravity, Urine: 1.013 (ref 1.005–1.030)
pH: 5 (ref 5.0–8.0)

## 2019-02-11 LAB — CBC WITH DIFFERENTIAL/PLATELET
Abs Immature Granulocytes: 0.03 10*3/uL (ref 0.00–0.07)
Basophils Absolute: 0.1 10*3/uL (ref 0.0–0.1)
Basophils Relative: 1 %
Eosinophils Absolute: 0.3 10*3/uL (ref 0.0–0.5)
Eosinophils Relative: 3 %
HCT: 39.8 % (ref 39.0–52.0)
Hemoglobin: 12.9 g/dL — ABNORMAL LOW (ref 13.0–17.0)
Immature Granulocytes: 0 %
Lymphocytes Relative: 20 %
Lymphs Abs: 2 10*3/uL (ref 0.7–4.0)
MCH: 29.7 pg (ref 26.0–34.0)
MCHC: 32.4 g/dL (ref 30.0–36.0)
MCV: 91.7 fL (ref 80.0–100.0)
Monocytes Absolute: 0.8 10*3/uL (ref 0.1–1.0)
Monocytes Relative: 9 %
Neutro Abs: 6.7 10*3/uL (ref 1.7–7.7)
Neutrophils Relative %: 67 %
Platelets: 255 10*3/uL (ref 150–400)
RBC: 4.34 MIL/uL (ref 4.22–5.81)
RDW: 12.7 % (ref 11.5–15.5)
WBC: 9.9 10*3/uL (ref 4.0–10.5)
nRBC: 0 % (ref 0.0–0.2)

## 2019-02-11 MED ORDER — IOHEXOL 300 MG/ML  SOLN
100.0000 mL | Freq: Once | INTRAMUSCULAR | Status: AC | PRN
  Administered 2019-02-11: 21:00:00 100 mL via INTRAVENOUS

## 2019-02-11 MED ORDER — HYDROMORPHONE HCL 1 MG/ML IJ SOLN
1.0000 mg | Freq: Once | INTRAMUSCULAR | Status: AC
  Administered 2019-02-11: 1 mg via INTRAVENOUS
  Filled 2019-02-11: qty 1

## 2019-02-11 MED ORDER — ONDANSETRON HCL 4 MG PO TABS: 4.0000 mg | ORAL_TABLET | Freq: Four times a day (QID) | ORAL | 0 refills | Status: DC

## 2019-02-11 MED ORDER — OXYCODONE-ACETAMINOPHEN 5-325 MG PO TABS: 1.0000 | ORAL_TABLET | Freq: Four times a day (QID) | ORAL | 0 refills | Status: DC | PRN

## 2019-02-11 MED ORDER — ONDANSETRON HCL 4 MG/2ML IJ SOLN
INTRAMUSCULAR | Status: AC
  Filled 2019-02-11: qty 2

## 2019-02-11 MED ORDER — ONDANSETRON HCL 4 MG/2ML IJ SOLN
4.0000 mg | Freq: Once | INTRAMUSCULAR | Status: AC
  Administered 2019-02-11: 4 mg via INTRAVENOUS

## 2019-02-11 MED ORDER — ONDANSETRON HCL 4 MG PO TABS: 4.0000 mg | ORAL_TABLET | Freq: Four times a day (QID) | ORAL | 0 refills | Status: AC

## 2019-02-11 MED ORDER — OXYCODONE-ACETAMINOPHEN 5-325 MG PO TABS: 2.0000 | ORAL_TABLET | ORAL | 0 refills | Status: DC | PRN

## 2019-02-11 MED ORDER — KETOROLAC TROMETHAMINE 30 MG/ML IJ SOLN
15.0000 mg | Freq: Once | INTRAMUSCULAR | Status: AC
  Administered 2019-02-11: 15 mg via INTRAVENOUS
  Filled 2019-02-11: qty 1

## 2019-02-11 MED ORDER — SODIUM CHLORIDE 0.9 % IV SOLN
INTRAVENOUS | Status: DC
  Administered 2019-02-11: 21:00:00 via INTRAVENOUS

## 2019-02-11 NOTE — ED Triage Notes (Signed)
Patient states he has had problems with urinary retention, is on medication. Has only had dribbling today, no effective stream. Burning with urine passage.

## 2019-02-11 NOTE — ED Notes (Signed)
Bladder scan: 29ml, foley inserted

## 2019-02-12 MED FILL — Oxycodone w/ Acetaminophen Tab 5-325 MG: ORAL | Qty: 6 | Status: AC

## 2019-02-13 LAB — URINE CULTURE: Culture: NO GROWTH

## 2019-02-15 DIAGNOSIS — R2689 Other abnormalities of gait and mobility: Secondary | ICD-10-CM | POA: Diagnosis not present

## 2019-02-15 DIAGNOSIS — M1712 Unilateral primary osteoarthritis, left knee: Secondary | ICD-10-CM | POA: Diagnosis not present

## 2019-02-18 DIAGNOSIS — R2689 Other abnormalities of gait and mobility: Secondary | ICD-10-CM | POA: Diagnosis not present

## 2019-02-18 DIAGNOSIS — M1712 Unilateral primary osteoarthritis, left knee: Secondary | ICD-10-CM | POA: Diagnosis not present

## 2019-02-20 DIAGNOSIS — M1712 Unilateral primary osteoarthritis, left knee: Secondary | ICD-10-CM | POA: Diagnosis not present

## 2019-02-20 DIAGNOSIS — R2689 Other abnormalities of gait and mobility: Secondary | ICD-10-CM | POA: Diagnosis not present

## 2019-02-21 NOTE — ED Provider Notes (Signed)
[facility] EMERGENCY DEPARTMENT Provider Note   CSN: [id number] Arrival date & time: 02/11/19  1847     History   Chief Complaint Chief Complaint  Patient presents with   Urinary Retention    HPI Tony Douglas is a 68 y.o. male.     HPI  68 year old male with lower abdominal pain and decreased urination.  Patient reports a past history of urinary retention.  Today he has had only dribbling and increasing pain.  No hematuria.  No fevers or chills.  Some nausea.  No unusual swelling.  Past Medical History:  Diagnosis Date   Allergic rhinitis    Anxiety    BPH (benign prostatic hyperplasia)    Carpal tunnel syndrome of left wrist    Complication of anesthesia    Constipation    Depression    Diverticular disease    Esophageal reflux    GERD (gastroesophageal reflux disease)    Hyperlipidemia, mixed    Hypertension    Obesity    Osteoarthritis    PONV (postoperative nausea and vomiting)    Primary localized osteoarthritis of left knee 12/31/2018    Patient Active Problem List   Diagnosis Date Noted   Primary localized osteoarthritis of left knee 12/31/2018   Osteoarthritis of right hip 05/21/2014   Cough 11/14/2011   Hypertension 11/14/2011   Asthma, extrinsic 11/14/2011    Past Surgical History:  Procedure Laterality Date   APPENDECTOMY     CARPAL TUNNEL RELEASE Left 04/20/2017   Procedure: LEFT CARPAL TUNNEL RELEASE;  Surgeon: [name], MD;  Location: [facility];  Service: Orthopedics;  Laterality: Left;   CARPAL TUNNEL RELEASE Right 05/04/2017   Procedure: CARPAL TUNNEL RELEASE;  Surgeon: [name], MD;  Location: [place];  Service: Orthopedics;  Laterality: Right;   COLECTOMY  2004   laproscopic sigmoid colectomy   COLONOSCOPY     HAMMER TOE SURGERY Right    2nd toe   HERNIA REPAIR Right    inguinal hernia   KNEE ARTHROSCOPY WITH MEDIAL MENISECTOMY Left 01/14/2019   Procedure: KNEE ARTHROSCOPY WITH MEDIAL MENISECTOMY VERSES A POSSIBLE UNIT ARTHROPLASTY POSSIBLE A TOTAL ARTHROPLASTY;  Surgeon: Elsie Saas, MD;  Location: [facility];  Service: Orthopedics;  Laterality: Left;   SHOULDER SURGERY  2008   right   TOTAL HIP ARTHROPLASTY Right 05/21/2014   Procedure: RIGHT TOTAL HIP ARTHROPLASTY;  Surgeon: [name], MD;  Location: [place];  Service: Orthopedics;  Laterality: Right;   TOTAL KNEE ARTHROPLASTY  2011   right   TOTAL KNEE ARTHROPLASTY Left 01/14/2019   Procedure: TOTAL KNEE ARTHROPLASTY;  Surgeon: Elsie Saas, MD;  Location: [facility];  Service: Orthopedics;  Laterality: Left;        Home Medications    Prior to Admission medications   Medication Sig Start Date End Date Taking? Authorizing Provider  acetaminophen (TYLENOL) 650 MG CR tablet Take 1,300 mg by mouth every 8 (eight) hours as needed for pain.   Yes [provider]  aspirin EC 325 MG EC tablet 1 tab a day for the next 30 days to prevent blood clots 01/15/19  Yes Shepperson, Kirstin, PA-C  budesonide-formoterol (SYMBICORT) 160-4.5 MCG/ACT inhaler Take 2 puffs first thing in am and then another 2 puffs about 12 hours later. Patient taking differently: Inhale 2 puffs into the lungs 2 (two) times a day.  10/17/13  Yes Tanda Rockers, MD  buPROPion [facility] SR) 150 MG 12 hr tablet Take  150 mg by mouth 2 (two) times daily.   Yes [provider]  Cholecalciferol (VITAMIN D) 2000 UNITS CAPS Take 2,000 Units by mouth daily.    Yes [provider]  doxazosin (CARDURA) 4 MG tablet Take 4 mg by mouth at bedtime.   Yes [provider]  dutasteride (AVODART) 0.5 MG capsule Take 0.5 mg by mouth at bedtime.    Yes [provider]  EPIPEN 2-PAK 0.3 MG/0.3ML SOAJ injection Inject 0.3 mg into the muscle as needed for anaphylaxis.    Yes [provider]  gabapentin (NEURONTIN) 300 MG capsule 1 tablet before bed for nerve pain Patient taking  differently: Take 300 mg by mouth at bedtime. 1 tablet before bed for nerve pain 01/15/19  Yes Shepperson, Kirstin, PA-C  montelukast (SINGULAIR) 10 MG tablet Take 1 tablet (10 mg total) by mouth at bedtime. 02/11/13 02/11/19 Yes Tanda Rockers, MD  omeprazole (PRILOSEC) 20 MG capsule Take 20 mg by mouth daily.    Yes Tanda Rockers, MD  polyethylene glycol (MIRALAX / GLYCOLAX) 17 g packet 17grams in 6 oz of something to drink twice a day until bowel movement.  LAXITIVE.  Restart if two days since last bowel movement Patient taking differently: Take 17 g by mouth every other day.  01/15/19  Yes Shepperson, Kirstin, PA-C  simvastatin (ZOCOR) 40 MG tablet Take 40 mg by mouth daily.    Yes [provider]  docusate sodium (COLACE) 100 MG capsule 1 tab 2 times a day while on narcotics.  STOOL SOFTENER Patient not taking: Reported on 02/11/2019 01/15/19   Shepperson, Kirstin, PA-C  ondansetron (ZOFRAN) 4 MG tablet Take 1 tablet (4 mg total) by mouth every 6 (six) hours. 02/11/19   Virgel Manifold, MD  oxyCODONE (OXY IR/ROXICODONE) 5 MG immediate release tablet Take 1 tablet (5 mg total) by mouth every 4 (four) hours as needed for severe pain. Patient not taking: Reported on 02/11/2019 01/15/19   Shepperson, Kirstin, PA-C  oxyCODONE-acetaminophen (PERCOCET/ROXICET) 5-325 MG tablet Take 2 tablets by mouth every 4 (four) hours as needed for severe pain. 02/11/19   Virgel Manifold, MD  oxyCODONE-acetaminophen (PERCOCET/ROXICET) 5-325 MG tablet Take 1-2 tablets by mouth every 6 (six) hours as needed for severe pain. 02/11/19   Virgel Manifold, MD    Family History Family History  Problem Relation Age of Onset   Emphysema Father        smoked and worked in [place] Brother    Heart disease Mother     Social History Social History   Tobacco Use   Smoking status: Former Smoker    Packs/day: 1.00    Years: 30.00    Pack years: 30.00    Types: Cigarettes    Quit date:  07/04/1992    Years since quitting: 26.6   Smokeless tobacco: Never Used  Substance Use Topics   Alcohol use: Yes    Comment: 1 or 2 beers per month   Drug use: No     Allergies   Other   Review of Systems Review of Systems  All systems reviewed and negative, other than as noted in HPI. Physical Exam Updated Vital Signs BP (!) 142/84 (BP Location: Right Arm)    Pulse 84    Temp 97.9 F (36.6 C) (Oral)    Resp 17    Ht 5\' 10"  (1.778 m)    Wt 102.1 kg    SpO2 98%  BMI 32.28 kg/m   Physical Exam Vitals signs and nursing note reviewed.  Constitutional:      Appearance: He is well-developed. He is ill-appearing. He is not diaphoretic.  HENT:     Head: Normocephalic and atraumatic.  Eyes:     General:        Right eye: No discharge.        Left eye: No discharge.     Conjunctiva/sclera: Conjunctivae normal.  Neck:     Musculoskeletal: Neck supple.  Cardiovascular:     Rate and Rhythm: Normal rate and regular rhythm.     Heart sounds: Normal heart sounds. No murmur. No friction rub. No gallop.   Pulmonary:     Effort: Pulmonary effort is normal. No respiratory distress.     Breath sounds: Normal breath sounds.  Abdominal:     General: There is no distension.     Palpations: Abdomen is soft.     Tenderness: There is no abdominal tenderness.  Musculoskeletal:     Comments: Family tender across the lower abdomen.  Hard to get a good exam.  Appears very uncomfortable.  Skin:    General: Skin is warm and dry.  Neurological:     Mental Status: He is alert.  Psychiatric:        Behavior: Behavior normal.        Thought Content: Thought content normal.      ED Treatments / Results  Labs (all labs ordered are listed, but only abnormal results are displayed) Labs Reviewed  URINALYSIS, ROUTINE W REFLEX MICROSCOPIC - Abnormal; Notable for the following components:      Result Value   Hgb urine dipstick LARGE (*)    RBC / HPF >50 (*)    Bacteria, UA RARE (*)    All  other components within normal limits  CBC WITH DIFFERENTIAL/PLATELET - Abnormal; Notable for the following components:   Hemoglobin 12.9 (*)    All other components within normal limits  BASIC METABOLIC PANEL - Abnormal; Notable for the following components:   Glucose, Bld 105 (*)    GFR calc non Af Amer 60 (*)    All other components within normal limits  URINE CULTURE    EKG None  Radiology No results found.   Ct Abdomen Pelvis W Contrast  Result Date: 02/11/2019 CLINICAL DATA:  Generalized abdominal pain.  Urinary retention. EXAM: CT ABDOMEN AND PELVIS WITH CONTRAST TECHNIQUE: Multidetector CT imaging of the abdomen and pelvis was performed using the standard protocol following bolus administration of intravenous contrast. CONTRAST:  117mL OMNIPAQUE IOHEXOL 300 MG/ML  SOLN COMPARISON:  None. FINDINGS: Lower chest: There is chronic appearing atelectasis in the lingula.The heart is enlarged. Hepatobiliary: There is decreased hepatic attenuation suggestive of hepatic steatosis. Normal gallbladder.There is no biliary ductal dilation. Pancreas: Normal contours without ductal dilatation. No peripancreatic fluid collection. Spleen: No splenic laceration or hematoma. Adrenals/Urinary Tract: --Adrenal glands: No adrenal hemorrhage. --Right kidney/ureter: There is a nonobstructing stone in the interpolar region of the right kidney. There is mild the moderate right-sided hydroureteronephrosis secondary to a 8-9 mm stone in the distal right ureter, nearly at the right UVJ. There is a smaller adjacent 1-2 mm stone more proximally within the distal right ureter. --Left kidney/ureter: There is a 1.4 cm stone in the lower pole of the left kidney. There is a 6 mm stone in the upper pole the left kidney. There is no left-sided hydronephrosis. --Urinary bladder: The bladder is decompressed with a Foley catheter  and therefore is poorly evaluated. Stomach/Bowel: --Stomach/Duodenum: There may be some mild wall  thickening of the distal esophagus. --Small bowel: No dilatation or inflammation. --Colon: There are few scattered colonic diverticula without CT evidence for diverticulitis. There may be a surgical anastomosis at the level of the proximal sigmoid colon. --Appendix: Not visualized. No right lower quadrant inflammation or free fluid. Vascular/Lymphatic: Atherosclerotic calcification is present within the non-aneurysmal abdominal aorta, without hemodynamically significant stenosis. --No retroperitoneal lymphadenopathy. --No mesenteric lymphadenopathy. --No pelvic or inguinal lymphadenopathy. Reproductive: Unremarkable Other: No ascites or free air. There are bilateral fat containing inguinal hernias, right greater than left Musculoskeletal. Multilevel degenerative changes are noted throughout the visualized lumbar spine. There is a bilateral pars defect at L5 without significant anterolisthesis. The patient is status post total hip arthroplasty on the right. There are moderate degenerative changes of the left hip IMPRESSION: 1. Mild to moderate right-sided hydroureteronephrosis secondary to an 8-9 mm stone in the distal right ureter, nearly at the right UVJ. There is an additional 1-2 mm stone in the distal right ureter more proximally that is likely nonobstructive. 2. Residual bilateral nephrolithiasis as detailed above. 3. Hepatic steatosis. Electronically Signed   By: Constance Holster M.D.   On: 02/11/2019 21:45     Procedures Procedures (including critical care time)  Medications Ordered in ED Medications  HYDROmorphone (DILAUDID) injection 1 mg (1 mg Intravenous Given 02/11/19 2033)  iohexol (OMNIPAQUE) 300 MG/ML solution 100 mL (100 mLs Intravenous Contrast Given 02/11/19 2126)  ondansetron (ZOFRAN) injection 4 mg (4 mg Intravenous Given 02/11/19 2140)  ketorolac (TORADOL) 30 MG/ML injection 15 mg (15 mg Intravenous Given 02/11/19 2203)   (has no administration in time range)     Initial  Impression / Assessment and Plan / ED Course  I have reviewed the triage vital signs and the nursing notes.  Pertinent labs & imaging results that were available during my care of the patient were reviewed by me and considered in my medical decision making (see chart for details).       68 year old male with lower abdominal pain and decreased urinary output.  I initially a concern for possible urinary retention.  Hard to get a good abdominal exam.  A Foley catheter was placed but fairly minimal return of urine of approximately 50 cc.  He reported no significant treatment of his symptoms.  Subsequent CT scan shows a ureteral stone.  Symptoms are now much improved with medications.  Foley catheter was removed.  Plan continue symptomatic treatment.   Outpatient follow-up with urology symptoms do not resolve.  Emergent return precautions discussed.  Final Clinical Impressions(s) / ED Diagnoses   Final diagnoses:  Kidney stone    ED Discharge Orders         Ordered    oxyCODONE-acetaminophen (PERCOCET/ROXICET) 5-325 MG tablet  Every 4 hours PRN     02/11/19 2302    oxyCODONE-acetaminophen (PERCOCET/ROXICET) 5-325 MG tablet  Every 6 hours PRN     02/11/19 2303    ondansetron (ZOFRAN) 4 MG tablet  Every 6 hours,   Status:  Discontinued     02/11/19 2303    ondansetron (ZOFRAN) 4 MG tablet  Every 6 hours     02/11/19 2308           Virgel Manifold, MD 02/21/19 [phone]

## 2019-02-22 DIAGNOSIS — M1712 Unilateral primary osteoarthritis, left knee: Secondary | ICD-10-CM | POA: Diagnosis not present

## 2019-02-22 DIAGNOSIS — R2689 Other abnormalities of gait and mobility: Secondary | ICD-10-CM | POA: Diagnosis not present

## 2019-02-25 DIAGNOSIS — R2689 Other abnormalities of gait and mobility: Secondary | ICD-10-CM | POA: Diagnosis not present

## 2019-02-25 DIAGNOSIS — M1712 Unilateral primary osteoarthritis, left knee: Secondary | ICD-10-CM | POA: Diagnosis not present

## 2019-02-26 ENCOUNTER — Other Ambulatory Visit: Payer: Self-pay | Admitting: Urology

## 2019-02-26 DIAGNOSIS — M1712 Unilateral primary osteoarthritis, left knee: Secondary | ICD-10-CM | POA: Diagnosis not present

## 2019-02-26 DIAGNOSIS — S83242D Other tear of medial meniscus, current injury, left knee, subsequent encounter: Secondary | ICD-10-CM | POA: Diagnosis not present

## 2019-02-26 DIAGNOSIS — N201 Calculus of ureter: Secondary | ICD-10-CM | POA: Diagnosis not present

## 2019-02-27 DIAGNOSIS — R2689 Other abnormalities of gait and mobility: Secondary | ICD-10-CM | POA: Diagnosis not present

## 2019-02-27 DIAGNOSIS — M1712 Unilateral primary osteoarthritis, left knee: Secondary | ICD-10-CM | POA: Diagnosis not present

## 2019-03-01 DIAGNOSIS — M1712 Unilateral primary osteoarthritis, left knee: Secondary | ICD-10-CM | POA: Diagnosis not present

## 2019-03-01 DIAGNOSIS — R2689 Other abnormalities of gait and mobility: Secondary | ICD-10-CM | POA: Diagnosis not present

## 2019-03-02 ENCOUNTER — Other Ambulatory Visit (HOSPITAL_COMMUNITY)
Admission: RE | Admit: 2019-03-02 | Discharge: 2019-03-02 | Disposition: A | Discharge status: Home / Self Care | Payer: Medicare Other | Source: Ambulatory Visit | Attending: Urology | Admitting: Urology

## 2019-03-02 DIAGNOSIS — Z20828 Contact with and (suspected) exposure to other viral communicable diseases: Secondary | ICD-10-CM | POA: Diagnosis not present

## 2019-03-02 DIAGNOSIS — Z01812 Encounter for preprocedural laboratory examination: Secondary | ICD-10-CM | POA: Diagnosis not present

## 2019-03-02 LAB — SARS CORONAVIRUS 2 (TAT 6-24 HRS): SARS Coronavirus 2: NEGATIVE

## 2019-03-04 DIAGNOSIS — I10 Essential (primary) hypertension: Secondary | ICD-10-CM | POA: Diagnosis not present

## 2019-03-04 DIAGNOSIS — E1165 Type 2 diabetes mellitus with hyperglycemia: Secondary | ICD-10-CM | POA: Diagnosis not present

## 2019-03-05 ENCOUNTER — Encounter (HOSPITAL_BASED_OUTPATIENT_CLINIC_OR_DEPARTMENT_OTHER): Payer: Self-pay | Admitting: *Deleted

## 2019-03-05 ENCOUNTER — Other Ambulatory Visit: Payer: Self-pay

## 2019-03-05 NOTE — Progress Notes (Signed)
Spoke w/ pt via phone for pre-op interview.  Npo after mn.  Arrive at [employer] [facility].  Needs istat.  Current ekg in chart and epic.  Will take wellbutrin, prilosec, zocor, and do symbicort inhaler am dos w/ sips of water.  If needed may take oxycodone/ zofran am dos.

## 2019-03-06 ENCOUNTER — Ambulatory Visit (HOSPITAL_BASED_OUTPATIENT_CLINIC_OR_DEPARTMENT_OTHER): Payer: Medicare Other | Admitting: Anesthesiology

## 2019-03-06 ENCOUNTER — Ambulatory Visit (HOSPITAL_BASED_OUTPATIENT_CLINIC_OR_DEPARTMENT_OTHER)
Admission: RE | Admit: 2019-03-06 | Discharge: 2019-03-06 | Disposition: A | Discharge status: Home / Self Care | Payer: Medicare Other | Attending: Urology | Admitting: Urology

## 2019-03-06 ENCOUNTER — Encounter (HOSPITAL_BASED_OUTPATIENT_CLINIC_OR_DEPARTMENT_OTHER): Payer: Self-pay | Admitting: *Deleted

## 2019-03-06 ENCOUNTER — Encounter (HOSPITAL_BASED_OUTPATIENT_CLINIC_OR_DEPARTMENT_OTHER)
Admission: RE | Disposition: A | Discharge status: Home / Self Care | Payer: Self-pay | Source: Home / Self Care | Attending: Urology

## 2019-03-06 ENCOUNTER — Other Ambulatory Visit: Payer: Self-pay

## 2019-03-06 DIAGNOSIS — Z87891 Personal history of nicotine dependence: Secondary | ICD-10-CM | POA: Diagnosis not present

## 2019-03-06 DIAGNOSIS — E669 Obesity, unspecified: Secondary | ICD-10-CM | POA: Insufficient documentation

## 2019-03-06 DIAGNOSIS — K219 Gastro-esophageal reflux disease without esophagitis: Secondary | ICD-10-CM | POA: Diagnosis not present

## 2019-03-06 DIAGNOSIS — N200 Calculus of kidney: Secondary | ICD-10-CM

## 2019-03-06 DIAGNOSIS — N201 Calculus of ureter: Secondary | ICD-10-CM | POA: Insufficient documentation

## 2019-03-06 DIAGNOSIS — Z6831 Body mass index (BMI) 31.0-31.9, adult: Secondary | ICD-10-CM | POA: Diagnosis not present

## 2019-03-06 DIAGNOSIS — Z79899 Other long term (current) drug therapy: Secondary | ICD-10-CM | POA: Insufficient documentation

## 2019-03-06 DIAGNOSIS — J449 Chronic obstructive pulmonary disease, unspecified: Secondary | ICD-10-CM | POA: Diagnosis not present

## 2019-03-06 DIAGNOSIS — M1712 Unilateral primary osteoarthritis, left knee: Secondary | ICD-10-CM | POA: Diagnosis not present

## 2019-03-06 DIAGNOSIS — J45909 Unspecified asthma, uncomplicated: Secondary | ICD-10-CM | POA: Diagnosis not present

## 2019-03-06 DIAGNOSIS — I10 Essential (primary) hypertension: Secondary | ICD-10-CM | POA: Insufficient documentation

## 2019-03-06 HISTORY — DX: Unspecified asthma, uncomplicated: J45.909

## 2019-03-06 HISTORY — PX: CYSTOSCOPY/URETEROSCOPY/HOLMIUM LASER/STENT PLACEMENT: SHX6546

## 2019-03-06 HISTORY — DX: Diverticulosis of large intestine without perforation or abscess without bleeding: K57.30

## 2019-03-06 HISTORY — DX: Personal history of urinary calculi: Z87.442

## 2019-03-06 HISTORY — DX: Calculus of kidney: N20.0

## 2019-03-06 HISTORY — DX: Major depressive disorder, single episode, unspecified: F32.9

## 2019-03-06 HISTORY — DX: Calculus of ureter: N20.1

## 2019-03-06 HISTORY — DX: Benign prostatic hyperplasia with lower urinary tract symptoms: N40.1

## 2019-03-06 HISTORY — DX: Other constipation: K59.09

## 2019-03-06 HISTORY — DX: Presence of spectacles and contact lenses: Z97.3

## 2019-03-06 LAB — POCT I-STAT, CHEM 8
BUN: 15 mg/dL (ref 8–23)
Calcium, Ion: 1.3 mmol/L (ref 1.15–1.40)
Chloride: 103 mmol/L (ref 98–111)
Creatinine, Ser: 0.9 mg/dL (ref 0.61–1.24)
Glucose, Bld: 114 mg/dL — ABNORMAL HIGH (ref 70–99)
HCT: 40 % (ref 39.0–52.0)
Hemoglobin: 13.6 g/dL (ref 13.0–17.0)
Potassium: 3.9 mmol/L (ref 3.5–5.1)
Sodium: 140 mmol/L (ref 135–145)
TCO2: 25 mmol/L (ref 22–32)

## 2019-03-06 SURGERY — CYSTOSCOPY/URETEROSCOPY/HOLMIUM LASER/STENT PLACEMENT
Anesthesia: General | Laterality: Right

## 2019-03-06 MED ORDER — MIDAZOLAM HCL 5 MG/5ML IJ SOLN
INTRAMUSCULAR | Status: DC | PRN
  Administered 2019-03-06: 2 mg via INTRAVENOUS

## 2019-03-06 MED ORDER — PHENAZOPYRIDINE HCL 200 MG PO TABS
200.0000 mg | ORAL_TABLET | Freq: Three times a day (TID) | ORAL | Status: DC | PRN
  Administered 2019-03-06: 200 mg via ORAL
  Filled 2019-03-06: qty 1

## 2019-03-06 MED ORDER — MIDAZOLAM HCL 2 MG/2ML IJ SOLN
INTRAMUSCULAR | Status: AC
  Filled 2019-03-06: qty 2

## 2019-03-06 MED ORDER — FENTANYL CITRATE (PF) 100 MCG/2ML IJ SOLN
INTRAMUSCULAR | Status: AC
  Filled 2019-03-06: qty 2

## 2019-03-06 MED ORDER — HYDROMORPHONE HCL 1 MG/ML IJ SOLN
0.2500 mg | INTRAMUSCULAR | Status: DC | PRN
  Administered 2019-03-06: 0.25 mg via INTRAVENOUS
  Filled 2019-03-06: qty 0.5

## 2019-03-06 MED ORDER — ONDANSETRON HCL 4 MG/2ML IJ SOLN
INTRAMUSCULAR | Status: DC | PRN
  Administered 2019-03-06: 4 mg via INTRAVENOUS

## 2019-03-06 MED ORDER — EPHEDRINE 5 MG/ML INJ
INTRAVENOUS | Status: AC
  Filled 2019-03-06: qty 10

## 2019-03-06 MED ORDER — LACTATED RINGERS IV SOLN
INTRAVENOUS | Status: DC
  Administered 2019-03-06 (×2): via INTRAVENOUS
  Filled 2019-03-06: qty 1000

## 2019-03-06 MED ORDER — PHENAZOPYRIDINE HCL 200 MG PO TABS: 200.0000 mg | ORAL_TABLET | Freq: Three times a day (TID) | ORAL | 0 refills | Status: DC | PRN

## 2019-03-06 MED ORDER — DEXAMETHASONE SODIUM PHOSPHATE 10 MG/ML IJ SOLN
INTRAMUSCULAR | Status: AC
  Filled 2019-03-06: qty 1

## 2019-03-06 MED ORDER — KETOROLAC TROMETHAMINE 30 MG/ML IJ SOLN
INTRAMUSCULAR | Status: DC | PRN
  Administered 2019-03-06: 30 mg via INTRAVENOUS

## 2019-03-06 MED ORDER — LIDOCAINE HCL (CARDIAC) PF 100 MG/5ML IV SOSY
PREFILLED_SYRINGE | INTRAVENOUS | Status: DC | PRN
  Administered 2019-03-06: 80 mg via INTRAVENOUS

## 2019-03-06 MED ORDER — BELLADONNA ALKALOIDS-OPIUM 16.2-60 MG RE SUPP
RECTAL | Status: DC | PRN
  Administered 2019-03-06: 1 via RECTAL

## 2019-03-06 MED ORDER — PROPOFOL 10 MG/ML IV BOLUS
INTRAVENOUS | Status: DC | PRN
  Administered 2019-03-06: 50 mg via INTRAVENOUS
  Administered 2019-03-06: 200 mg via INTRAVENOUS

## 2019-03-06 MED ORDER — OXYCODONE HCL 5 MG/5ML PO SOLN
5.0000 mg | Freq: Once | ORAL | Status: DC | PRN
  Filled 2019-03-06: qty 5

## 2019-03-06 MED ORDER — OXYCODONE HCL 5 MG PO TABS
5.0000 mg | ORAL_TABLET | Freq: Once | ORAL | Status: DC | PRN
  Filled 2019-03-06: qty 1

## 2019-03-06 MED ORDER — CEFAZOLIN SODIUM-DEXTROSE 2-4 GM/100ML-% IV SOLN
2.0000 g | INTRAVENOUS | Status: AC
  Administered 2019-03-06: 08:00:00 2 g via INTRAVENOUS
  Filled 2019-03-06: qty 100

## 2019-03-06 MED ORDER — MEPERIDINE HCL 25 MG/ML IJ SOLN
6.2500 mg | INTRAMUSCULAR | Status: DC | PRN
  Filled 2019-03-06: qty 1

## 2019-03-06 MED ORDER — ONDANSETRON HCL 4 MG/2ML IJ SOLN
INTRAMUSCULAR | Status: AC
  Filled 2019-03-06: qty 2

## 2019-03-06 MED ORDER — LIDOCAINE 2% (20 MG/ML) 5 ML SYRINGE
INTRAMUSCULAR | Status: AC
  Filled 2019-03-06: qty 5

## 2019-03-06 MED ORDER — HYDROMORPHONE HCL 1 MG/ML IJ SOLN
INTRAMUSCULAR | Status: AC
  Filled 2019-03-06: qty 1

## 2019-03-06 MED ORDER — PHENAZOPYRIDINE HCL 100 MG PO TABS
ORAL_TABLET | ORAL | Status: AC
  Filled 2019-03-06: qty 2

## 2019-03-06 MED ORDER — PROMETHAZINE HCL 25 MG/ML IJ SOLN
6.2500 mg | INTRAMUSCULAR | Status: DC | PRN
  Filled 2019-03-06: qty 1

## 2019-03-06 MED ORDER — CIPROFLOXACIN HCL 500 MG PO TABS: 500.0000 mg | ORAL_TABLET | Freq: Once | ORAL | 0 refills | Status: AC

## 2019-03-06 MED ORDER — EPHEDRINE SULFATE 50 MG/ML IJ SOLN
INTRAMUSCULAR | Status: DC | PRN
  Administered 2019-03-06: 20 mg via INTRAVENOUS

## 2019-03-06 MED ORDER — SODIUM CHLORIDE 0.9 % IR SOLN
Status: DC | PRN
  Administered 2019-03-06: 6000 mL via INTRAVESICAL

## 2019-03-06 MED ORDER — BELLADONNA ALKALOIDS-OPIUM 16.2-60 MG RE SUPP
RECTAL | Status: AC
  Filled 2019-03-06: qty 1

## 2019-03-06 MED ORDER — PROPOFOL 10 MG/ML IV BOLUS
INTRAVENOUS | Status: AC
  Filled 2019-03-06: qty 40

## 2019-03-06 MED ORDER — DEXAMETHASONE SODIUM PHOSPHATE 4 MG/ML IJ SOLN
INTRAMUSCULAR | Status: DC | PRN
  Administered 2019-03-06: 10 mg via INTRAVENOUS

## 2019-03-06 MED ORDER — TRAMADOL HCL 50 MG PO TABS: 50.0000 mg | ORAL_TABLET | Freq: Four times a day (QID) | ORAL | 0 refills | Status: DC | PRN

## 2019-03-06 MED ORDER — IOHEXOL 300 MG/ML  SOLN
INTRAMUSCULAR | Status: DC | PRN
  Administered 2019-03-06: 10 mL via URETHRAL

## 2019-03-06 MED ORDER — CEFAZOLIN SODIUM-DEXTROSE 2-4 GM/100ML-% IV SOLN
INTRAVENOUS | Status: AC
  Filled 2019-03-06: qty 100

## 2019-03-06 MED ORDER — FENTANYL CITRATE (PF) 100 MCG/2ML IJ SOLN
INTRAMUSCULAR | Status: DC | PRN
  Administered 2019-03-06 (×8): 25 ug via INTRAVENOUS

## 2019-03-06 SURGICAL SUPPLY — 20 items
BAG DRAIN URO-CYSTO SKYTR STRL (DRAIN) ×3 IMPLANT
BASKET LASER NITINOL 1.9FR (BASKET) IMPLANT
CATH URET 5FR 28IN OPEN ENDED (CATHETERS) ×3 IMPLANT
CATH URET DUAL LUMEN 6-10FR 50 (CATHETERS) IMPLANT
CLOTH BEACON ORANGE TIMEOUT ST (SAFETY) ×3 IMPLANT
DRSG TEGADERM 2-3/8X2-3/4 SM (GAUZE/BANDAGES/DRESSINGS) ×3 IMPLANT
EXTRACTOR STONE 1.7FRX115CM (UROLOGICAL SUPPLIES) ×3 IMPLANT
FIBER LASER TRAC TIP (UROLOGICAL SUPPLIES) IMPLANT
GLOVE BIO SURGEON STRL SZ7.5 (GLOVE) ×3 IMPLANT
GOWN STRL REUS W/TWL XL LVL3 (GOWN DISPOSABLE) ×3 IMPLANT
GUIDEWIRE STR DUAL SENSOR (WIRE) ×3 IMPLANT
IV NS IRRIG 3000ML ARTHROMATIC (IV SOLUTION) ×6 IMPLANT
KIT TURNOVER CYSTO (KITS) ×3 IMPLANT
MANIFOLD NEPTUNE II (INSTRUMENTS) IMPLANT
NS IRRIG 500ML POUR BTL (IV SOLUTION) ×3 IMPLANT
PACK CYSTO (CUSTOM PROCEDURE TRAY) ×3 IMPLANT
STENT URET 6FRX26 CONTOUR (STENTS) ×3 IMPLANT
TUBE CONNECTING 12'X1/4 (SUCTIONS)
TUBE CONNECTING 12X1/4 (SUCTIONS) IMPLANT
TUBING UROLOGY SET (TUBING) ×3 IMPLANT

## 2019-03-06 NOTE — Discharge Instructions (Signed)
DISCHARGE INSTRUCTIONS FOR KIDNEY STONE/URETERAL STENT   MEDICATIONS:  1. Resume all your other meds from home - except do not take any extra narcotic pain meds that you may have at home.  2. Pyridium is to help with the burning/stinging when you urinate. 3. Tramadol is for moderate/severe pain, otherwise taking upto 1000 mg every 6 hours of plainTylenol will help treat your pain. 4. Take Cipro one hour prior to removal of your stent.   ACTIVITY:  1. No strenuous activity x 1week  2. No driving while on narcotic pain medications  3. Drink plenty of water  4. Continue to walk at home - you can still get blood clots when you are at home, so keep active, but don't over do it.  5. May return to work/school tomorrow or when you feel ready   BATHING:  1. You can shower and we recommend daily showers  2. You have a string coming from your urethra: The stent string is attached to your ureteral stent. Do not pull on this.   SIGNS/SYMPTOMS TO CALL:  Please call us if you have a fever greater than 101.5, uncontrolled nausea/vomiting, uncontrolled pain, dizziness, unable to urinate, bloody urine, chest pain, shortness of breath, leg swelling, leg pain, redness around wound, drainage from wound, or any other concerns or questions.   You can reach [place] at [phone].   FOLLOW-UP:  1. You have an appointment in 6 weeks with a ultrasound of your kidneys prior.  2. You have a string attached to your stent, you may remove it on Monday, Sept 7th. To do this, pull the strings until the stents are completely removed. You may feel an odd sensation in your back.  NO ADVIL, ALEVE, MOTRIN, IBUPROFEN UNTIL 330 PM  Post Anesthesia Home Care Instructions  Activity: Get plenty of rest for the remainder of the day. A responsible adult should stay with you for 24 hours following the procedure.  For the next 24 hours, DO NOT: -Drive a car -Paediatric nurse -Drink alcoholic beverages -Take any medication  unless instructed by your physician -Make any legal decisions or sign important papers.  Meals: Start with liquid foods such as gelatin or soup. Progress to regular foods as tolerated. Avoid greasy, spicy, heavy foods. If nausea and/or vomiting occur, drink only clear liquids until the nausea and/or vomiting subsides. Call your physician if vomiting continues.  Special Instructions/Symptoms: Your throat may feel dry or sore from the anesthesia or the breathing tube placed in your throat during surgery. If this causes discomfort, gargle with warm salt water. The discomfort should disappear within 24 hours.  If you had a scopolamine patch placed behind your ear for the management of post- operative nausea and/or vomiting:  1. The medication in the patch is effective for 72 hours, after which it should be removed.  Wrap patch in a tissue and discard in the trash. Wash hands thoroughly with soap and water. 2. You may remove the patch earlier than 72 hours if you experience unpleasant side effects which may include dry mouth, dizziness or visual disturbances. 3. Avoid touching the patch. Wash your hands with soap and water after contact with the patch.

## 2019-03-06 NOTE — Anesthesia Preprocedure Evaluation (Signed)
Anesthesia Evaluation  Patient identified by MRN, date of birth, ID band Patient awake    Reviewed: Allergy & Precautions, NPO status , Patient's Chart, lab work & pertinent test results  History of Anesthesia Complications (+) PONV  Airway Mallampati: II  TM Distance: >3 FB Neck ROM: Full    Dental  (+) Dental Advisory Given, Chipped   Pulmonary asthma , COPD,  COPD inhaler, former smoker,  01/10/2019 SARS coronavirus NEG   breath sounds clear to auscultation       Cardiovascular hypertension, Pt. on medications (-) angina Rhythm:Regular Rate:Normal     Neuro/Psych Anxiety Depression negative neurological ROS     GI/Hepatic Neg liver ROS, GERD  Medicated and Controlled,  Endo/Other  obese  Renal/GU Renal disease     Musculoskeletal  (+) Arthritis , Osteoarthritis,    Abdominal (+) + obese,   Peds  Hematology negative hematology ROS (+)   Anesthesia Other Findings   Reproductive/Obstetrics                             Anesthesia Physical  Anesthesia Plan  ASA: III  Anesthesia Plan: General   Post-op Pain Management:    Induction: Intravenous  PONV Risk Score and Plan: 3 and Ondansetron, Dexamethasone, Midazolam and Treatment may vary due to age or medical condition  Airway Management Planned: LMA  Additional Equipment:   Intra-op Plan:   Post-operative Plan: Extubation in OR  Informed Consent: I have reviewed the patients History and Physical, chart, labs and discussed the procedure including the risks, benefits and alternatives for the proposed anesthesia with the patient or authorized representative who has indicated his/her understanding and acceptance.     Dental advisory given  Plan Discussed with: CRNA and Surgeon  Anesthesia Plan Comments:         Anesthesia Quick Evaluation

## 2019-03-06 NOTE — Transfer of Care (Signed)
Immediate Anesthesia Transfer of Care Note  Patient: Tony Douglas  Procedure(s) Performed: Procedure(s) (LRB): RIGHT URETEROSCOPY/HOLMIUM LASER/STENT PLACEMENT STONE BASKET RETRIVAL (Right)  Patient Location: PACU  Anesthesia Type: General  Level of Consciousness: awake, sedated, patient cooperative and responds to stimulation  Airway & Oxygen Therapy: Patient Spontanous Breathing and Patient connected to [place] O2 and soft FM  Post-op Assessment: Report given to PACU RN, Post -op Vital signs reviewed and stable and Patient moving all extremities  Post vital signs: Reviewed and stable  Complications: No apparent anesthesia complications

## 2019-03-06 NOTE — H&P (Signed)
Acute Kidney Stone  HPI: Tony Douglas is a 68 year-old male patient who was referred by Dr. Mitzie Na. [name], MD who is here for further eval and management of kidney stones.  He was diagnosed with a kidney stone on 02/11/2019. The patient presented to [facility] with symptoms of a kidney stone.   The pain is on the right side.   Abdomen/Pelvic CT: 29mm distal right ureteral stone, large volume left non-obstructing stones. The patient underwent No imaging prior to today's appointment.   The patient relates initially having flank pain and groin pain. He is currently having groin pain. He denies having flank pain, back pain, nausea, vomiting, fever, chills, and voiding symptoms. He has not caught a stone in his urine strainer since his symptoms began.   He has never had surgical treatment for calculi in the past. This is not his first kidney stone. His first stone was approximately 02/02/2004. He has had 1 stones prior to getting this one.   The patient's pain has been reasonably well controlled over the past couple weeks. He does have some intermittent inguinal and groin pain. He denies any dysuria. He has not had any fevers or chills.     ALLERGIES: Bee stings    MEDICATIONS: Omeprazole 20 mg tablet, delayed release  Simvastatin 40 mg tablet  Bupropion Hcl Sr 150 mg tablet,sustained-release 12 hr  Doxazosin Mesylate 4 mg tablet  Dutasteride 0.5 mg capsule  Montelukast Sodium 10 mg tablet  Olmesartan-Hydrochlorothiazide 20 mg-12.5 mg tablet  Vitamin D3 50 mcg (2,000 unit) tablet     GU PSH: None   NON-GU PSH: Appendectomy Carpal tunnel surgery, Bilateral Knee replacement, Bilateral Rotator cuff surgery, Right     GU PMH: None   NON-GU PMH: Arthritis GERD Hypercholesterolemia Hypertension    FAMILY HISTORY: None   SOCIAL HISTORY: Marital Status: Married Preferred Language: English; Ethnicity: Not Hispanic Or Latino; Race: White Current Smoking Status: Patient does not smoke  anymore. Has not smoked since 02/01/1993.   Tobacco Use Assessment Completed: Used Tobacco in last 30 days? Has never drank.  Does not use drugs. Drinks 1 caffeinated drink per day.    REVIEW OF SYSTEMS:    GU Review Male:   Patient reports frequent urination, hard to postpone urination, burning/ pain with urination, get up at night to urinate, stream starts and stops, trouble starting your stream, and have to strain to urinate . Patient denies leakage of urine, erection problems, and penile pain.  Gastrointestinal (Upper):   Patient reports nausea. Patient denies vomiting and indigestion/ heartburn.  Gastrointestinal (Lower):   Patient reports constipation. Patient denies diarrhea.  Constitutional:   Patient denies fever, night sweats, weight loss, and fatigue.  Skin:   Patient denies skin rash/ lesion and itching.  Eyes:   Patient denies blurred vision and double vision.  Ears/ Nose/ Throat:   Patient denies sore throat and sinus problems.  Hematologic/Lymphatic:   Patient denies swollen glands and easy bruising.  Cardiovascular:   Patient denies leg swelling and chest pains.  Respiratory:   Patient denies cough and shortness of breath.  Endocrine:   Patient denies excessive thirst.  Musculoskeletal:   Patient reports back pain. Patient denies joint pain.  Neurological:   Patient denies headaches and dizziness.  Psychologic:   Patient denies depression and anxiety.   Notes: right low back pain and flank     VITAL SIGNS:      02/26/2019 09:00 AM  Weight 220 lb / 99.79 kg  Height 70 in / 177.8 cm  BP 117/82 mmHg  Pulse 102 /min  Temperature 97.7 F / 36.5 C  BMI 31.6 kg/m   MULTI-SYSTEM PHYSICAL EXAMINATION:    Constitutional: Well-nourished. No physical deformities. Normally developed. Good grooming.  Neck: Neck symmetrical, not swollen. Normal tracheal position.  Respiratory: Normal breath sounds. No labored breathing, no use of accessory muscles.   Cardiovascular: Regular  rate and rhythm. No murmur, no gallop. Normal temperature, normal extremity pulses, no swelling, no varicosities.   Lymphatic: No enlargement of neck, axillae, groin.  Skin: No paleness, no jaundice, no cyanosis. No lesion, no ulcer, no rash.  Neurologic / Psychiatric: Oriented to time, oriented to place, oriented to person. No depression, no anxiety, no agitation.  Gastrointestinal: No mass, no tenderness, no rigidity, non obese abdomen.  Eyes: Normal conjunctivae. Normal eyelids.  Ears, Nose, Mouth, and Throat: Left ear no scars, no lesions, no masses. Right ear no scars, no lesions, no masses. Nose no scars, no lesions, no masses. Normal hearing. Normal lips.  Musculoskeletal: Normal gait and station of head and neck.     PAST DATA REVIEWED:  Source Of History:  Patient  Records Review:   Previous Doctor Records, Previous Hospital Records, Previous Patient Records  X-Ray Review: C.T. Abdomen/Pelvis: Reviewed Films. Discussed With Patient.     PROCEDURES:         KUB - [id number]  A single view of the abdomen is obtained. Renal shadows are easily visualized bilaterally. There are no stones appreciated within the expected location in either renal pelvis. There are no additional calcifications along the expected location of either ureter bilaterally.  Gas pattern is grossly normal. No significant bony abnormalities.      Patient confirmed No Neulasta OnPro Device.            Urinalysis w/Scope Dipstick Dipstick Cont'd Micro  Color: Yellow Bilirubin: Neg mg/dL WBC/hpf: NS (Not Seen)  Appearance: Clear Ketones: Neg mg/dL RBC/hpf: 3 - 10/hpf  Specific Gravity: 1.015 Blood: 2+ ery/uL Bacteria: NS (Not Seen)  pH: <=5.0 Protein: Neg mg/dL Cystals: NS (Not Seen)  Glucose: Neg mg/dL Urobilinogen: 0.2 mg/dL Casts: NS (Not Seen)    Nitrites: Neg Trichomonas: Not Present    Leukocyte Esterase: Neg leu/uL Mucous: Not Present      Epithelial Cells: NS (Not Seen)      Yeast: NS (Not Seen)       Sperm: Not Present    ASSESSMENT:      ICD-10 Details  1 GU:   Ureteral calculus - N20.1    PLAN:           Orders X-Rays: KUB          Schedule         Document Letter(s):  Created for Patient: Clinical Summary         Notes:   The patient has a large right distal ureteral stone that has not moved since his CT scan on the 10th of August. There is also an additional stone just proximal to that. We discussed treatment options including shockwave lithotripsy and ureteroscopy. The patient is opted to proceed with ureteroscopy. Fortunately his pain is relatively well controlled and the procedure is not urgent. As such, will try to get this scheduled in the next 7-10 days. We discussed the procedure in detail including the risks and the benefits. We also discussed the postprocedural stent placement and associated discomfort. He understands risks and benefits and is eager to proceed.

## 2019-03-06 NOTE — Anesthesia Procedure Notes (Signed)
Procedure Name: LMA Insertion Date/Time: 03/06/2019 8:27 AM Performed by: Justice Rocher, CRNA Pre-anesthesia Checklist: Patient identified, Emergency Drugs available, Suction available and Patient being monitored Patient Re-evaluated:Patient Re-evaluated prior to induction Oxygen Delivery Method: Circle system utilized Preoxygenation: Pre-oxygenation with 100% oxygen Induction Type: IV induction Ventilation: Mask ventilation without difficulty LMA: LMA inserted LMA Size: 5.0 Number of attempts: 1 Airway Equipment and Method: Bite block Placement Confirmation: positive ETCO2 and breath sounds checked- equal and bilateral Tube secured with: Tape Dental Injury: Teeth and Oropharynx as per pre-operative assessment

## 2019-03-06 NOTE — Op Note (Signed)
Preoperative diagnosis: right ureteral calculus  Postoperative diagnosis: right ureteral calculus  Procedure:  1. Cystoscopy 2. right ureteroscopy and stone removal 3. Ureteroscopic laser lithotripsy 4. right 2F x 26 ureteral stent placement  5. right retrograde pyelography with interpretation  Surgeon: Ardis Hughs, MD  Anesthesia: General  Complications: None  Intraoperative findings: #1) Right retrograde pyelography demonstrated a filling defect within the right ureter consistent with the patient's known calculus without other abnormalities.   #2)  There is a large stone at the UVJ with multiple fragments that I lasered and removed.  I then used the flexible ureteroscope to explore the right collecting system.  There is a stone in the upper pole that I was able to basket and removed without laser fragmentation.  EBL: Minimal  Specimens: 1. right ureteral calculus  Disposition of specimens: [facility] for stone analysis  Indication: Tony Douglas is a 68 y.o.   patient with a right ureteral stone and associated right symptoms. After reviewing the management options for treatment, the patient elected to proceed with the above surgical procedure(s). We have discussed the potential benefits and risks of the procedure, side effects of the proposed treatment, the likelihood of the patient achieving the goals of the procedure, and any potential problems that might occur during the procedure or recuperation. Informed consent has been obtained.   Description of procedure:  The patient was taken to the operating room and general anesthesia was induced.  The patient was placed in the dorsal lithotomy position, prepped and draped in the usual sterile fashion, and preoperative antibiotics were administered. A preoperative time-out was performed.   Cystourethroscopy was performed.  The patient's urethra was examined and was normal and demonstrated bilobar prostatic  hypertrophy. The bladder was then systematically examined in its entirety. There was no evidence for any bladder tumors, stones, or other mucosal pathology.    Attention then turned to the right ureteral orifice and a ureteral catheter was used to intubate the ureteral orifice.  Omnipaque contrast was injected through the ureteral catheter and a retrograde pyelogram was performed with findings as dictated above.  A 0.38 sensor guidewire was then advanced up the right ureter into the renal pelvis under fluoroscopic guidance. The 6 Fr semirigid ureteroscope was then advanced into the ureter next to the guidewire and the calculus was identified.   The stone was then fragmented with the 200 micron holmium laser fiber on a setting of 1.0 and frequency of 10 Hz.   All stones were then removed from the ureter with an N-gage nitinol basket.  Reinspection of the ureter revealed no remaining visible stones or fragments.   I then exchanged the rigid ureteroscope for the flexible digital ureteroscope and advanced through the right ureteral orifice and into the right kidney under visual guidance.  Once up in the right kidney I was able to encounter the stone in the upper pole that was seen on CT scan.  I was able to basket this and remove it without laser fragmentation.  Prior to removing the stone I did explore the remainder of the kidney under fluoroscopic guidance after inserting contrast into the renal pelvis.  There were no additional stone fragments noted.  I slowly backed the stone out inspecting the ureter as I came out noting no significant ureteral trauma.  The wire was then backloaded through the cystoscope and a ureteral stent was advance over the wire using Seldinger technique.  The stent was positioned appropriately under fluoroscopic and cystoscopic  guidance.  The wire was then removed with an adequate stent curl noted in the renal pelvis as well as in the bladder.  The bladder was then emptied and  the procedure ended.  The patient appeared to tolerate the procedure well and without complications.  The patient was able to be awakened and transferred to the recovery unit in satisfactory condition.   Disposition: The tether of the stent was left on and secured to the ventral aspect of the patient's penis.  Instructions for removing the stent have been provided to the patient. The patient has been scheduled for followup in 6 weeks with a renal ultrasound.

## 2019-03-06 NOTE — Anesthesia Postprocedure Evaluation (Signed)
Anesthesia Post Note  Patient: [name]  Procedure(s) Performed: RIGHT URETEROSCOPY/HOLMIUM LASER/STENT PLACEMENT STONE BASKET RETRIVAL (Right )     Patient location during evaluation: PACU Anesthesia Type: General Level of consciousness: awake and alert Pain management: pain level controlled Vital Signs Assessment: post-procedure vital signs reviewed and stable Respiratory status: spontaneous breathing, nonlabored ventilation and respiratory function stable Cardiovascular status: blood pressure returned to baseline and stable Postop Assessment: no apparent nausea or vomiting Anesthetic complications: no    Last Vitals:  Vitals:   03/06/19 1000 03/06/19 1058  BP: 128/82 139/83  Pulse: 91 90  Resp: 13 16  Temp:  36.8 C  SpO2: (!) 88% 95%    Last Pain:  Vitals:   03/06/19 1058  TempSrc:   PainSc: [place]

## 2019-03-07 ENCOUNTER — Encounter (HOSPITAL_BASED_OUTPATIENT_CLINIC_OR_DEPARTMENT_OTHER): Payer: Self-pay | Admitting: Urology

## 2019-03-08 DIAGNOSIS — N201 Calculus of ureter: Secondary | ICD-10-CM | POA: Diagnosis not present

## 2019-03-12 DIAGNOSIS — R2689 Other abnormalities of gait and mobility: Secondary | ICD-10-CM | POA: Diagnosis not present

## 2019-03-12 DIAGNOSIS — M1712 Unilateral primary osteoarthritis, left knee: Secondary | ICD-10-CM | POA: Diagnosis not present

## 2019-03-14 DIAGNOSIS — M1712 Unilateral primary osteoarthritis, left knee: Secondary | ICD-10-CM | POA: Diagnosis not present

## 2019-03-14 DIAGNOSIS — R2689 Other abnormalities of gait and mobility: Secondary | ICD-10-CM | POA: Diagnosis not present

## 2019-03-18 DIAGNOSIS — M1712 Unilateral primary osteoarthritis, left knee: Secondary | ICD-10-CM | POA: Diagnosis not present

## 2019-03-18 DIAGNOSIS — R2689 Other abnormalities of gait and mobility: Secondary | ICD-10-CM | POA: Diagnosis not present

## 2019-03-20 DIAGNOSIS — R2689 Other abnormalities of gait and mobility: Secondary | ICD-10-CM | POA: Diagnosis not present

## 2019-03-20 DIAGNOSIS — M1712 Unilateral primary osteoarthritis, left knee: Secondary | ICD-10-CM | POA: Diagnosis not present

## 2019-04-01 DIAGNOSIS — M1712 Unilateral primary osteoarthritis, left knee: Secondary | ICD-10-CM | POA: Diagnosis not present

## 2019-04-01 DIAGNOSIS — R2689 Other abnormalities of gait and mobility: Secondary | ICD-10-CM | POA: Diagnosis not present

## 2019-04-02 DIAGNOSIS — M1712 Unilateral primary osteoarthritis, left knee: Secondary | ICD-10-CM | POA: Diagnosis not present

## 2019-04-03 DIAGNOSIS — I10 Essential (primary) hypertension: Secondary | ICD-10-CM | POA: Diagnosis not present

## 2019-04-03 DIAGNOSIS — E785 Hyperlipidemia, unspecified: Secondary | ICD-10-CM | POA: Diagnosis not present

## 2019-04-03 DIAGNOSIS — J449 Chronic obstructive pulmonary disease, unspecified: Secondary | ICD-10-CM | POA: Diagnosis not present

## 2019-04-08 DIAGNOSIS — N202 Calculus of kidney with calculus of ureter: Secondary | ICD-10-CM | POA: Diagnosis not present

## 2019-04-10 ENCOUNTER — Other Ambulatory Visit: Payer: Self-pay | Admitting: Urology

## 2019-04-19 NOTE — Patient Instructions (Addendum)
DUE TO COVID-19 ONLY ONE VISITOR IS ALLOWED TO COME WITH YOU AND STAY IN THE WAITING ROOM ONLY DURING PRE OP AND PROCEDURE. THE ONE VISITOR MAY VISIT WITH YOU IN YOUR PRIVATE ROOM DURING VISITING HOURS ONLY!!   COVID SWAB TESTING  COMPLETED ON:     Saturday, Oct. 17, 2020  (Must self quarantine after testing. Follow instructions on handout.)             Your procedure is scheduled on: Wednesday, Oct. 21, 2020   Report to [facility] Main  Entrance    Report to admitting at 8:55 AM   Call this number if you have problems the morning of surgery [phone]   Do not eat food or drink liquids :After Midnight.   Brush your teeth the morning of surgery.   Do NOT smoke after Midnight   Take these medicines the morning of surgery with A SIP OF WATER: Wellbutrin, Omeprazole, Simvastatin   May use Symbicort and Flonase per normal routine                               You may not have any metal on your body including jewelry, and body piercings             Do not wear lotions, powders, perfumes/cologne, or deodorant                           Men may shave face and neck.   Do not bring valuables to the hospital. [place].   Contacts, dentures or bridgework may not be worn into surgery.   Bring small overnight bag day of surgery.    Special Instructions: Bring a copy of your healthcare power of attorney and living will documents         the day of surgery if you haven't scanned them in before.              Please read over the following fact sheets you were given:  [facility] - Preparing for Surgery Before surgery, you can play an important role.  Because skin is not sterile, your skin needs to be as free of germs as possible.  You can reduce the number of germs on your skin by washing with CHG (chlorahexidine gluconate) soap before surgery.  CHG is an antiseptic cleaner which kills germs and bonds with the skin to continue  killing germs even after washing. Please DO NOT use if you have an allergy to CHG or antibacterial soaps.  If your skin becomes reddened/irritated stop using the CHG and inform your nurse when you arrive at Short Stay. Do not shave (including legs and underarms) for at least 48 hours prior to the first CHG shower.  You may shave your face/neck.  Please follow these instructions carefully:  1.  Shower with CHG Soap the night before surgery and the  morning of surgery.  2.  If you choose to wash your hair, wash your hair first as usual with your normal  shampoo.  3.  After you shampoo, rinse your hair and body thoroughly to remove the shampoo.  4.  Use CHG as you would any other liquid soap.  You can apply chg directly to the skin and wash.  Gently with a scrungie or clean washcloth.  5.  Apply the CHG Soap to your body ONLY FROM THE NECK DOWN.   Do   not use on face/ open                           Wound or open sores. Avoid contact with eyes, ears mouth and   genitals (private parts).                       Wash face,  Genitals (private parts) with your normal soap.             6.  Wash thoroughly, paying special attention to the area where your    surgery  will be performed.  7.  Thoroughly rinse your body with warm water from the neck down.  8.  DO NOT shower/wash with your normal soap after using and rinsing off the CHG Soap.                9.  Pat yourself dry with a clean towel.            10.  Wear clean pajamas.            11.  Place clean sheets on your bed the night of your first shower and do not  sleep with pets. Day of Surgery : Do not apply any lotions/deodorants the morning of surgery.  Please wear clean clothes to the hospital/surgery center.  FAILURE TO FOLLOW THESE INSTRUCTIONS MAY RESULT IN THE CANCELLATION OF YOUR SURGERY  PATIENT SIGNATURE_________________________________  NURSE  SIGNATURE__________________________________  ________________________________________________________________________ WHAT IS A BLOOD TRANSFUSION? Blood Transfusion Information  A transfusion is the replacement of blood or some of its parts. Blood is made up of multiple cells which provide different functions.  Red blood cells carry oxygen and are used for blood loss replacement.  White blood cells fight against infection.  Platelets control bleeding.  Plasma helps clot blood.  Other blood products are available for specialized needs, such as hemophilia or other clotting disorders. BEFORE THE TRANSFUSION  Who gives blood for transfusions?   Healthy volunteers who are fully evaluated to make sure their blood is safe. This is blood bank blood. Transfusion therapy is the safest it has ever been in the practice of medicine. Before blood is taken from a donor, a complete history is taken to make sure that person has no history of diseases nor engages in risky social behavior (examples are intravenous drug use or sexual activity with multiple partners). The donor's travel history is screened to minimize risk of transmitting infections, such as malaria. The donated blood is tested for signs of infectious diseases, such as HIV and hepatitis. The blood is then tested to be sure it is compatible with you in order to minimize the chance of a transfusion reaction. If you or a relative donates blood, this is often done in anticipation of surgery and is not appropriate for emergency situations. It takes many days to process the donated blood. RISKS AND COMPLICATIONS Although transfusion therapy is very safe and saves many lives, the main dangers of transfusion include:   Getting an infectious disease.  Developing a transfusion reaction. This is an allergic reaction to something in the blood you were given. Every precaution is taken  to prevent this. The decision to have a blood transfusion has been  considered carefully by your caregiver before blood is given. Blood is not given unless the benefits outweigh the risks. AFTER THE TRANSFUSION  Right after receiving a blood transfusion, you will usually feel much better and more energetic. This is especially true if your red blood cells have gotten low (anemic). The transfusion raises the level of the red blood cells which carry oxygen, and this usually causes an energy increase.  The nurse administering the transfusion will monitor you carefully for complications. HOME CARE INSTRUCTIONS  No special instructions are needed after a transfusion. You may find your energy is better. Speak with your caregiver about any limitations on activity for underlying diseases you may have. SEEK MEDICAL CARE IF:   Your condition is not improving after your transfusion.  You develop redness or irritation at the intravenous (IV) site. SEEK IMMEDIATE MEDICAL CARE IF:  Any of the following symptoms occur over the next 12 hours:  Shaking chills.  You have a temperature by mouth above 102 F (38.9 C), not controlled by medicine.  Chest, back, or muscle pain.  People around you feel you are not acting correctly or are confused.  Shortness of breath or difficulty breathing.  Dizziness and fainting.  You get a rash or develop hives.  You have a decrease in urine output.  Your urine turns a dark color or changes to pink, red, or brown. Any of the following symptoms occur over the next 10 days:  You have a temperature by mouth above 102 F (38.9 C), not controlled by medicine.  Shortness of breath.  Weakness after normal activity.  The white part of the eye turns yellow (jaundice).  You have a decrease in the amount of urine or are urinating less often.  Your urine turns a dark color or changes to pink, red, or brown. Document Released: 06/17/2000 Document Revised: 09/12/2011 Document Reviewed: 02/04/2008 [facility] Patient Information 2014  [place], [place].  _______________________________________________________________________

## 2019-04-20 ENCOUNTER — Other Ambulatory Visit (HOSPITAL_COMMUNITY)
Admission: RE | Admit: 2019-04-20 | Discharge: 2019-04-20 | Disposition: A | Discharge status: Home / Self Care | Payer: Medicare Other | Source: Ambulatory Visit | Attending: Urology | Admitting: Urology

## 2019-04-20 DIAGNOSIS — Z20828 Contact with and (suspected) exposure to other viral communicable diseases: Secondary | ICD-10-CM | POA: Diagnosis not present

## 2019-04-20 DIAGNOSIS — Z01812 Encounter for preprocedural laboratory examination: Secondary | ICD-10-CM | POA: Diagnosis not present

## 2019-04-21 LAB — NOVEL CORONAVIRUS, NAA (HOSP ORDER, SEND-OUT TO REF LAB; TAT 18-24 HRS): SARS-CoV-2, NAA: NOT DETECTED

## 2019-04-22 ENCOUNTER — Encounter (HOSPITAL_COMMUNITY): Payer: Self-pay

## 2019-04-22 ENCOUNTER — Encounter (HOSPITAL_COMMUNITY)
Admission: RE | Admit: 2019-04-22 | Discharge: 2019-04-22 | Disposition: A | Discharge status: Home / Self Care | Payer: Medicare Other | Source: Ambulatory Visit | Attending: Urology | Admitting: Urology

## 2019-04-22 ENCOUNTER — Other Ambulatory Visit: Payer: Self-pay

## 2019-04-22 DIAGNOSIS — N200 Calculus of kidney: Secondary | ICD-10-CM | POA: Insufficient documentation

## 2019-04-22 DIAGNOSIS — Z01812 Encounter for preprocedural laboratory examination: Secondary | ICD-10-CM | POA: Diagnosis not present

## 2019-04-22 HISTORY — DX: Personal history of other diseases of the nervous system and sense organs: Z86.69

## 2019-04-22 LAB — BASIC METABOLIC PANEL
Anion gap: 12 (ref 5–15)
BUN: 17 mg/dL (ref 8–23)
CO2: 22 mmol/L (ref 22–32)
Calcium: 9.6 mg/dL (ref 8.9–10.3)
Chloride: 103 mmol/L (ref 98–111)
Creatinine, Ser: 1.17 mg/dL (ref 0.61–1.24)
GFR calc Af Amer: >60 mL/min (ref >60)
GFR calc non Af Amer: >60 mL/min (ref >60)
Glucose, Bld: 160 mg/dL — ABNORMAL HIGH (ref 70–99)
Potassium: 3.5 mmol/L (ref 3.5–5.1)
Sodium: 137 mmol/L (ref 135–145)

## 2019-04-22 LAB — CBC
HCT: 39.8 % (ref 39.0–52.0)
Hemoglobin: 13.5 g/dL (ref 13.0–17.0)
MCH: 30.1 pg (ref 26.0–34.0)
MCHC: 33.9 g/dL (ref 30.0–36.0)
MCV: 88.8 fL (ref 80.0–100.0)
Platelets: 207 10*3/uL (ref 150–400)
RBC: 4.48 MIL/uL (ref 4.22–5.81)
RDW: 12.9 % (ref 11.5–15.5)
WBC: 5.7 10*3/uL (ref 4.0–10.5)
nRBC: 0 % (ref 0.0–0.2)

## 2019-04-22 LAB — ABO/RH: ABO/RH(D): A NEG

## 2019-04-22 NOTE — Progress Notes (Signed)
SPOKE W/  Lonzell     SCREENING SYMPTOMS OF COVID 19:   COUGH--NO  RUNNY NOSE--- NO  SORE THROAT---NO  NASAL CONGESTION----NO  SNEEZING----NO  SHORTNESS OF BREATH---NO  DIFFICULTY BREATHING---NO  TEMP >100.0 -----NO  UNEXPLAINED BODY ACHES------NO  CHILLS -------- NO  HEADACHES ---------NO  LOSS OF SMELL/ TASTE --------NO    HAVE YOU OR ANY FAMILY MEMBER TRAVELLED PAST 14 DAYS OUT OF THE   COUNTY---Lives in [facility] STATE----NO COUNTRY-NO---  HAVE YOU OR ANY FAMILY MEMBER BEEN EXPOSED TO ANYONE WITH COVID 19? NO

## 2019-04-22 NOTE — Progress Notes (Signed)
PCP - Dr. Kern Alberta, [facility] [address] [address] Cardiologist - N/A  Chest x-ray - N/A EKG - 01/03/2019 in epic Stress Test - Greater than 2 years ECHO - N/A Cardiac Cath - N/A  Sleep Study - N/A CPAP - N/A  Fasting Blood Sugar - N/A Checks Blood Sugar _N/A____ times a day  Blood Thinner Instructions: N/A  Aspirin Instructions:N/A Last Dose: N/A  Anesthesia review:  N/A  Patient denies shortness of breath, fever, cough and chest pain at PAT appointment   Patient verbalized understanding of instructions that were given to them at the PAT appointment. Patient was also instructed that they will need to review over the PAT instructions again at home before surgery.

## 2019-04-23 LAB — URINE CULTURE: Culture: NO GROWTH

## 2019-04-24 ENCOUNTER — Encounter (HOSPITAL_COMMUNITY): Payer: Self-pay | Admitting: *Deleted

## 2019-04-24 ENCOUNTER — Encounter (HOSPITAL_COMMUNITY)
Admission: RE | Disposition: A | Discharge status: Home / Self Care | Payer: Self-pay | Source: Home / Self Care | Attending: Urology

## 2019-04-24 ENCOUNTER — Observation Stay (HOSPITAL_COMMUNITY)
Admission: RE | Admit: 2019-04-24 | Discharge: 2019-04-25 | Disposition: A | Discharge status: Home / Self Care | Payer: Medicare Other | Source: Home / Self Care | Attending: Urology | Admitting: Urology

## 2019-04-24 ENCOUNTER — Ambulatory Visit (HOSPITAL_COMMUNITY): Payer: Medicare Other | Admitting: Physician Assistant

## 2019-04-24 ENCOUNTER — Ambulatory Visit (HOSPITAL_COMMUNITY): Payer: Medicare Other | Admitting: Certified Registered Nurse Anesthetist

## 2019-04-24 ENCOUNTER — Ambulatory Visit (HOSPITAL_COMMUNITY): Payer: Medicare Other

## 2019-04-24 ENCOUNTER — Other Ambulatory Visit: Payer: Self-pay

## 2019-04-24 DIAGNOSIS — N200 Calculus of kidney: Secondary | ICD-10-CM | POA: Diagnosis not present

## 2019-04-24 DIAGNOSIS — Z20828 Contact with and (suspected) exposure to other viral communicable diseases: Secondary | ICD-10-CM | POA: Diagnosis not present

## 2019-04-24 DIAGNOSIS — Z79899 Other long term (current) drug therapy: Secondary | ICD-10-CM | POA: Insufficient documentation

## 2019-04-24 DIAGNOSIS — M1712 Unilateral primary osteoarthritis, left knee: Secondary | ICD-10-CM | POA: Insufficient documentation

## 2019-04-24 DIAGNOSIS — Z87442 Personal history of urinary calculi: Secondary | ICD-10-CM | POA: Insufficient documentation

## 2019-04-24 DIAGNOSIS — N179 Acute kidney failure, unspecified: Secondary | ICD-10-CM | POA: Diagnosis not present

## 2019-04-24 DIAGNOSIS — Z96649 Presence of unspecified artificial hip joint: Secondary | ICD-10-CM | POA: Insufficient documentation

## 2019-04-24 DIAGNOSIS — J449 Chronic obstructive pulmonary disease, unspecified: Secondary | ICD-10-CM | POA: Insufficient documentation

## 2019-04-24 DIAGNOSIS — J189 Pneumonia, unspecified organism: Secondary | ICD-10-CM | POA: Diagnosis not present

## 2019-04-24 DIAGNOSIS — I10 Essential (primary) hypertension: Secondary | ICD-10-CM | POA: Insufficient documentation

## 2019-04-24 DIAGNOSIS — Z7951 Long term (current) use of inhaled steroids: Secondary | ICD-10-CM | POA: Insufficient documentation

## 2019-04-24 DIAGNOSIS — Z96653 Presence of artificial knee joint, bilateral: Secondary | ICD-10-CM | POA: Insufficient documentation

## 2019-04-24 DIAGNOSIS — M1611 Unilateral primary osteoarthritis, right hip: Secondary | ICD-10-CM | POA: Insufficient documentation

## 2019-04-24 DIAGNOSIS — E7800 Pure hypercholesterolemia, unspecified: Secondary | ICD-10-CM | POA: Insufficient documentation

## 2019-04-24 DIAGNOSIS — R109 Unspecified abdominal pain: Secondary | ICD-10-CM | POA: Diagnosis not present

## 2019-04-24 DIAGNOSIS — K219 Gastro-esophageal reflux disease without esophagitis: Secondary | ICD-10-CM | POA: Insufficient documentation

## 2019-04-24 DIAGNOSIS — F418 Other specified anxiety disorders: Secondary | ICD-10-CM | POA: Diagnosis not present

## 2019-04-24 DIAGNOSIS — N202 Calculus of kidney with calculus of ureter: Secondary | ICD-10-CM | POA: Insufficient documentation

## 2019-04-24 DIAGNOSIS — Z87891 Personal history of nicotine dependence: Secondary | ICD-10-CM | POA: Insufficient documentation

## 2019-04-24 DIAGNOSIS — F329 Major depressive disorder, single episode, unspecified: Secondary | ICD-10-CM | POA: Insufficient documentation

## 2019-04-24 DIAGNOSIS — R509 Fever, unspecified: Secondary | ICD-10-CM | POA: Diagnosis not present

## 2019-04-24 DIAGNOSIS — N12 Tubulo-interstitial nephritis, not specified as acute or chronic: Secondary | ICD-10-CM | POA: Diagnosis not present

## 2019-04-24 DIAGNOSIS — E782 Mixed hyperlipidemia: Secondary | ICD-10-CM | POA: Diagnosis not present

## 2019-04-24 DIAGNOSIS — Z96641 Presence of right artificial hip joint: Secondary | ICD-10-CM | POA: Diagnosis not present

## 2019-04-24 DIAGNOSIS — R000 Tachycardia, unspecified: Secondary | ICD-10-CM | POA: Diagnosis not present

## 2019-04-24 DIAGNOSIS — G8918 Other acute postprocedural pain: Secondary | ICD-10-CM | POA: Diagnosis not present

## 2019-04-24 HISTORY — PX: NEPHROLITHOTOMY: SHX5134

## 2019-04-24 HISTORY — PX: CYSTOSCOPY: SHX5120

## 2019-04-24 LAB — CBC
HCT: 35.2 % — ABNORMAL LOW (ref 39.0–52.0)
Hemoglobin: 11.5 g/dL — ABNORMAL LOW (ref 13.0–17.0)
MCH: 29.6 pg (ref 26.0–34.0)
MCHC: 32.7 g/dL (ref 30.0–36.0)
MCV: 90.7 fL (ref 80.0–100.0)
Platelets: 201 10*3/uL (ref 150–400)
RBC: 3.88 MIL/uL — ABNORMAL LOW (ref 4.22–5.81)
RDW: 13.1 % (ref 11.5–15.5)
WBC: 10.8 10*3/uL — ABNORMAL HIGH (ref 4.0–10.5)
nRBC: 0 % (ref 0.0–0.2)

## 2019-04-24 LAB — BASIC METABOLIC PANEL
Anion gap: 9 (ref 5–15)
BUN: 16 mg/dL (ref 8–23)
CO2: 21 mmol/L — ABNORMAL LOW (ref 22–32)
Calcium: 8.5 mg/dL — ABNORMAL LOW (ref 8.9–10.3)
Chloride: 106 mmol/L (ref 98–111)
Creatinine, Ser: 1.14 mg/dL (ref 0.61–1.24)
GFR calc Af Amer: >60 mL/min (ref >60)
GFR calc non Af Amer: >60 mL/min (ref >60)
Glucose, Bld: 176 mg/dL — ABNORMAL HIGH (ref 70–99)
Potassium: 4.1 mmol/L (ref 3.5–5.1)
Sodium: 136 mmol/L (ref 135–145)

## 2019-04-24 LAB — TYPE AND SCREEN
ABO/RH(D): A NEG
Antibody Screen: NEGATIVE

## 2019-04-24 SURGERY — NEPHROLITHOTOMY PERCUTANEOUS
Anesthesia: General | Laterality: Left

## 2019-04-24 MED ORDER — LACTATED RINGERS IV SOLN
INTRAVENOUS | Status: DC
  Administered 2019-04-24 (×2): via INTRAVENOUS

## 2019-04-24 MED ORDER — DEXAMETHASONE SODIUM PHOSPHATE 10 MG/ML IJ SOLN
INTRAMUSCULAR | Status: AC
  Filled 2019-04-24: qty 1

## 2019-04-24 MED ORDER — ACETAMINOPHEN 10 MG/ML IV SOLN
1000.0000 mg | Freq: Four times a day (QID) | INTRAVENOUS | Status: AC
  Administered 2019-04-24 – 2019-04-25 (×4): 1000 mg via INTRAVENOUS
  Filled 2019-04-24 (×3): qty 100

## 2019-04-24 MED ORDER — HYDROMORPHONE HCL 1 MG/ML IJ SOLN
0.5000 mg | INTRAMUSCULAR | Status: DC | PRN
  Administered 2019-04-24: 0.5 mg via INTRAVENOUS
  Filled 2019-04-24: qty 0.5

## 2019-04-24 MED ORDER — BUPIVACAINE HCL (PF) 0.25 % IJ SOLN
INTRAMUSCULAR | Status: DC | PRN
  Administered 2019-04-24: 20 mL

## 2019-04-24 MED ORDER — ACETAMINOPHEN 160 MG/5ML PO SOLN: 325.0000 mg | ORAL | Status: DC | PRN

## 2019-04-24 MED ORDER — PANTOPRAZOLE SODIUM 40 MG PO TBEC
40.0000 mg | DELAYED_RELEASE_TABLET | Freq: Every day | ORAL | Status: DC
  Administered 2019-04-25: 40 mg via ORAL
  Filled 2019-04-24: qty 1

## 2019-04-24 MED ORDER — MOMETASONE FURO-FORMOTEROL FUM 200-5 MCG/ACT IN AERO
2.0000 | INHALATION_SPRAY | Freq: Two times a day (BID) | RESPIRATORY_TRACT | Status: DC
  Administered 2019-04-24 – 2019-04-25 (×2): 2 via RESPIRATORY_TRACT
  Filled 2019-04-24: qty 8.8

## 2019-04-24 MED ORDER — BUPROPION HCL ER (SR) 150 MG PO TB12
150.0000 mg | ORAL_TABLET | Freq: Two times a day (BID) | ORAL | Status: DC
  Administered 2019-04-24 – 2019-04-25 (×2): 150 mg via ORAL
  Filled 2019-04-24 (×2): qty 1

## 2019-04-24 MED ORDER — ONDANSETRON HCL 4 MG/2ML IJ SOLN
INTRAMUSCULAR | Status: AC
  Filled 2019-04-24: qty 2

## 2019-04-24 MED ORDER — CEFAZOLIN SODIUM-DEXTROSE 2-4 GM/100ML-% IV SOLN
2.0000 g | INTRAVENOUS | Status: DC
  Filled 2019-04-24: qty 100

## 2019-04-24 MED ORDER — MIDAZOLAM HCL 2 MG/2ML IJ SOLN
INTRAMUSCULAR | Status: AC
  Filled 2019-04-24: qty 2

## 2019-04-24 MED ORDER — BUPIVACAINE HCL 0.25 % IJ SOLN
INTRAMUSCULAR | Status: AC
  Filled 2019-04-24: qty 1

## 2019-04-24 MED ORDER — LACTATED RINGERS IV SOLN
INTRAVENOUS | Status: DC
  Administered 2019-04-24 – 2019-04-25 (×3): via INTRAVENOUS

## 2019-04-24 MED ORDER — ACETAMINOPHEN 325 MG PO TABS: 325.0000 mg | ORAL_TABLET | ORAL | Status: DC | PRN

## 2019-04-24 MED ORDER — DOXAZOSIN MESYLATE 4 MG PO TABS
4.0000 mg | ORAL_TABLET | Freq: Every day | ORAL | Status: DC
  Administered 2019-04-24: 4 mg via ORAL
  Filled 2019-04-24 (×2): qty 1
  Filled 2019-04-24: qty 2

## 2019-04-24 MED ORDER — SIMVASTATIN 40 MG PO TABS: 40.0000 mg | ORAL_TABLET | Freq: Every day | ORAL | Status: DC

## 2019-04-24 MED ORDER — FENTANYL CITRATE (PF) 100 MCG/2ML IJ SOLN
25.0000 ug | INTRAMUSCULAR | Status: DC | PRN
  Administered 2019-04-24 (×2): 50 ug via INTRAVENOUS

## 2019-04-24 MED ORDER — HYDROCHLOROTHIAZIDE 10 MG/ML ORAL SUSPENSION
6.2500 mg | Freq: Every day | ORAL | Status: DC
  Administered 2019-04-25: 6.25 mg via ORAL
  Filled 2019-04-24 (×3): qty 1.25

## 2019-04-24 MED ORDER — SODIUM CHLORIDE 0.9 % IR SOLN
Status: DC | PRN
  Administered 2019-04-24: 30000 mL

## 2019-04-24 MED ORDER — DEXAMETHASONE SODIUM PHOSPHATE 10 MG/ML IJ SOLN
INTRAMUSCULAR | Status: DC | PRN
  Administered 2019-04-24: 10 mg via INTRAVENOUS

## 2019-04-24 MED ORDER — FENTANYL CITRATE (PF) 100 MCG/2ML IJ SOLN
INTRAMUSCULAR | Status: AC
  Filled 2019-04-24: qty 2

## 2019-04-24 MED ORDER — ONDANSETRON HCL 4 MG/2ML IJ SOLN: 4.0000 mg | Freq: Once | INTRAMUSCULAR | Status: DC | PRN

## 2019-04-24 MED ORDER — VITAMIN D3 25 MCG (1000 UNIT) PO TABS
5000.0000 [IU] | ORAL_TABLET | Freq: Every day | ORAL | Status: DC
  Administered 2019-04-25: 10:00:00 5000 [IU] via ORAL
  Filled 2019-04-24: qty 5

## 2019-04-24 MED ORDER — DUTASTERIDE 0.5 MG PO CAPS
0.5000 mg | ORAL_CAPSULE | Freq: Every day | ORAL | Status: DC
  Administered 2019-04-24: 21:00:00 0.5 mg via ORAL
  Filled 2019-04-24 (×2): qty 1

## 2019-04-24 MED ORDER — LIDOCAINE HCL (CARDIAC) PF 100 MG/5ML IV SOSY
PREFILLED_SYRINGE | INTRAVENOUS | Status: DC | PRN
  Administered 2019-04-24: 100 mg via INTRAVENOUS

## 2019-04-24 MED ORDER — TRAMADOL HCL 50 MG PO TABS
50.0000 mg | ORAL_TABLET | Freq: Four times a day (QID) | ORAL | Status: DC | PRN
  Administered 2019-04-24: 50 mg via ORAL
  Administered 2019-04-25: 100 mg via ORAL
  Filled 2019-04-24: qty 1
  Filled 2019-04-24: qty 2

## 2019-04-24 MED ORDER — HYDROMORPHONE HCL 1 MG/ML IJ SOLN
INTRAMUSCULAR | Status: AC
  Administered 2019-04-24: 15:00:00 0.5 mg via INTRAVENOUS
  Filled 2019-04-24: qty 2

## 2019-04-24 MED ORDER — MONTELUKAST SODIUM 10 MG PO TABS
10.0000 mg | ORAL_TABLET | Freq: Every day | ORAL | Status: DC
  Administered 2019-04-24: 20:00:00 10 mg via ORAL
  Filled 2019-04-24: qty 1

## 2019-04-24 MED ORDER — LIDOCAINE 2% (20 MG/ML) 5 ML SYRINGE
INTRAMUSCULAR | Status: AC
  Filled 2019-04-24: qty 5

## 2019-04-24 MED ORDER — OXYCODONE HCL 5 MG PO TABS: 5.0000 mg | ORAL_TABLET | Freq: Once | ORAL | Status: DC | PRN

## 2019-04-24 MED ORDER — ONDANSETRON HCL 4 MG PO TABS
4.0000 mg | ORAL_TABLET | Freq: Three times a day (TID) | ORAL | Status: DC | PRN
  Administered 2019-04-24: 17:00:00 4 mg via ORAL
  Filled 2019-04-24 (×2): qty 1

## 2019-04-24 MED ORDER — ROCURONIUM BROMIDE 100 MG/10ML IV SOLN
INTRAVENOUS | Status: DC | PRN
  Administered 2019-04-24: 50 mg via INTRAVENOUS

## 2019-04-24 MED ORDER — ONDANSETRON HCL 4 MG/2ML IJ SOLN
INTRAMUSCULAR | Status: DC | PRN
  Administered 2019-04-24 (×2): 4 mg via INTRAVENOUS

## 2019-04-24 MED ORDER — HYDRALAZINE HCL 20 MG/ML IJ SOLN: 5.0000 mg | INTRAMUSCULAR | Status: DC | PRN

## 2019-04-24 MED ORDER — GABAPENTIN 300 MG PO CAPS
300.0000 mg | ORAL_CAPSULE | Freq: Every day | ORAL | Status: DC
  Administered 2019-04-24: 300 mg via ORAL
  Filled 2019-04-24: qty 1

## 2019-04-24 MED ORDER — 0.9 % SODIUM CHLORIDE (POUR BTL) OPTIME
TOPICAL | Status: DC | PRN
  Administered 2019-04-24: 1000 mL

## 2019-04-24 MED ORDER — OLMESARTAN MEDOXOMIL-HCTZ 20-12.5 MG PO TABS: 0.5000 | ORAL_TABLET | Freq: Every day | ORAL | Status: DC

## 2019-04-24 MED ORDER — CIPROFLOXACIN IN D5W 400 MG/200ML IV SOLN
400.0000 mg | INTRAVENOUS | Status: DC
  Filled 2019-04-24: qty 200

## 2019-04-24 MED ORDER — HYDROMORPHONE HCL 1 MG/ML IJ SOLN
0.5000 mg | INTRAMUSCULAR | Status: AC | PRN
  Administered 2019-04-24 (×4): 0.5 mg via INTRAVENOUS

## 2019-04-24 MED ORDER — MEPERIDINE HCL 50 MG/ML IJ SOLN: 6.2500 mg | INTRAMUSCULAR | Status: DC | PRN

## 2019-04-24 MED ORDER — FENTANYL CITRATE (PF) 100 MCG/2ML IJ SOLN
INTRAMUSCULAR | Status: AC
  Administered 2019-04-24: 50 ug via INTRAVENOUS
  Filled 2019-04-24: qty 2

## 2019-04-24 MED ORDER — PROPOFOL 10 MG/ML IV BOLUS
INTRAVENOUS | Status: DC | PRN
  Administered 2019-04-24: 200 mg via INTRAVENOUS

## 2019-04-24 MED ORDER — ACETAMINOPHEN 10 MG/ML IV SOLN: 1000.0000 mg | Freq: Once | INTRAVENOUS | Status: DC | PRN

## 2019-04-24 MED ORDER — FLUTICASONE PROPIONATE 50 MCG/ACT NA SUSP
2.0000 | Freq: Every day | NASAL | Status: DC
  Filled 2019-04-24: qty 16

## 2019-04-24 MED ORDER — ROCURONIUM BROMIDE 10 MG/ML (PF) SYRINGE
PREFILLED_SYRINGE | INTRAVENOUS | Status: AC
  Filled 2019-04-24: qty 10

## 2019-04-24 MED ORDER — IRBESARTAN 150 MG PO TABS
75.0000 mg | ORAL_TABLET | Freq: Every day | ORAL | Status: DC
  Administered 2019-04-25: 10:00:00 75 mg via ORAL
  Filled 2019-04-24: qty 1

## 2019-04-24 MED ORDER — MIDAZOLAM HCL 5 MG/5ML IJ SOLN
INTRAMUSCULAR | Status: DC | PRN
  Administered 2019-04-24: 2 mg via INTRAVENOUS

## 2019-04-24 MED ORDER — SUGAMMADEX SODIUM 200 MG/2ML IV SOLN
INTRAVENOUS | Status: DC | PRN
  Administered 2019-04-24: 200 mg via INTRAVENOUS

## 2019-04-24 MED ORDER — FENTANYL CITRATE (PF) 100 MCG/2ML IJ SOLN
INTRAMUSCULAR | Status: DC | PRN
  Administered 2019-04-24 (×2): 50 ug via INTRAVENOUS
  Administered 2019-04-24: 100 ug via INTRAVENOUS

## 2019-04-24 MED ORDER — IOHEXOL 300 MG/ML  SOLN
INTRAMUSCULAR | Status: DC | PRN
  Administered 2019-04-24: 15:00:00 50 mL

## 2019-04-24 MED ORDER — ACETAMINOPHEN 10 MG/ML IV SOLN
INTRAVENOUS | Status: AC
  Administered 2019-04-24: 21:00:00 1000 mg via INTRAVENOUS
  Filled 2019-04-24: qty 100

## 2019-04-24 MED ORDER — PROPOFOL 10 MG/ML IV BOLUS
INTRAVENOUS | Status: AC
  Filled 2019-04-24: qty 20

## 2019-04-24 MED ORDER — OXYCODONE HCL 5 MG/5ML PO SOLN: 5.0000 mg | Freq: Once | ORAL | Status: DC | PRN

## 2019-04-24 SURGICAL SUPPLY — 61 items
BAG URINE DRAINAGE (UROLOGICAL SUPPLIES) ×2 IMPLANT
BASKET STONE NCOMPASS (UROLOGICAL SUPPLIES) IMPLANT
BASKET ZERO TIP NITINOL 2.4FR (BASKET) ×2 IMPLANT
BENZOIN TINCTURE PRP APPL 2/3 (GAUZE/BANDAGES/DRESSINGS) ×4 IMPLANT
BLADE SURG 15 STRL LF DISP TIS (BLADE) ×2 IMPLANT
BLADE SURG 15 STRL SS (BLADE) ×2
CATH AINSWORTH 30CC 24FR (CATHETERS) ×2 IMPLANT
CATH FOLEY 2WAY SLVR  5CC 16FR (CATHETERS)
CATH FOLEY 2WAY SLVR 5CC 16FR (CATHETERS) ×2 IMPLANT
CATH IMAGER II 65CM (CATHETERS) ×4 IMPLANT
CATH URET 5FR 28IN OPEN ENDED (CATHETERS) ×4 IMPLANT
CATH URET DUAL LUMEN 6-10FR 50 (CATHETERS) ×4 IMPLANT
CATH X-FORCE N30 NEPHROSTOMY (TUBING) ×4 IMPLANT
CHLORAPREP W/TINT 26 (MISCELLANEOUS) ×4 IMPLANT
COVER WAND RF STERILE (DRAPES) IMPLANT
DRAPE C-ARM 42X120 X-RAY (DRAPES) ×4 IMPLANT
DRAPE LINGEMAN PERC (DRAPES) ×4 IMPLANT
DRSG PAD ABDOMINAL 8X10 ST (GAUZE/BANDAGES/DRESSINGS) ×6 IMPLANT
DRSG TEGADERM 4X4.75 (GAUZE/BANDAGES/DRESSINGS) ×2 IMPLANT
DRSG TEGADERM 8X12 (GAUZE/BANDAGES/DRESSINGS) ×4 IMPLANT
EXTRACTOR STONE 1.7FRX115CM (UROLOGICAL SUPPLIES) IMPLANT
FIBER LASER FLEXIVA 365 (UROLOGICAL SUPPLIES) IMPLANT
FIBER LASER TRAC TIP (UROLOGICAL SUPPLIES) IMPLANT
GAUZE SPONGE 4X4 12PLY STRL (GAUZE/BANDAGES/DRESSINGS) ×4 IMPLANT
GLOVE BIOGEL M STRL SZ7.5 (GLOVE) ×4 IMPLANT
GOWN STRL REUS W/TWL XL LVL3 (GOWN DISPOSABLE) ×4 IMPLANT
GUIDEWIRE AMPLAZ .035X145 (WIRE) ×8 IMPLANT
GUIDEWIRE ANG ZIPWIRE 038X150 (WIRE) IMPLANT
GUIDEWIRE STR DUAL SENSOR (WIRE) ×4 IMPLANT
HLDR NDL AMPLATZ W/INSERTS (MISCELLANEOUS) IMPLANT
HOLDER NEEDLE AMPLATZ W/INSERT (MISCELLANEOUS) IMPLANT
IV SET EXTENSION CATH 6 NF (IV SETS) ×4 IMPLANT
KIT BASIN OR (CUSTOM PROCEDURE TRAY) ×4 IMPLANT
KIT PROBE 340X3.4XDISP GRN (MISCELLANEOUS) IMPLANT
KIT PROBE TRILOGY 3.4X340 (MISCELLANEOUS) ×2
KIT PROBE TRILOGY 3.9X350 (MISCELLANEOUS) IMPLANT
KIT TURNOVER KIT A (KITS) ×2 IMPLANT
MANIFOLD NEPTUNE II (INSTRUMENTS) ×4 IMPLANT
NDL SPNL 20GX3.5 QUINCKE YW (NEEDLE) IMPLANT
NDL TROCAR 18X15 ECHO (NEEDLE) IMPLANT
NDL TROCAR 18X20 (NEEDLE) IMPLANT
NEEDLE SPNL 20GX3.5 QUINCKE YW (NEEDLE) ×4 IMPLANT
NEEDLE TROCAR 18X15 ECHO (NEEDLE) ×4 IMPLANT
NEEDLE TROCAR 18X20 (NEEDLE) IMPLANT
NS IRRIG 1000ML POUR BTL (IV SOLUTION) ×2 IMPLANT
PACK CYSTO (CUSTOM PROCEDURE TRAY) ×4 IMPLANT
SHEATH PEELAWAY SET 9 (SHEATH) ×2 IMPLANT
SPONGE DRAIN TRACH 4X4 STRL 2S (GAUZE/BANDAGES/DRESSINGS) ×2 IMPLANT
SPONGE LAP 4X18 RFD (DISPOSABLE) ×4 IMPLANT
SUT ETHILON 3 0 PS 1 (SUTURE) ×4 IMPLANT
SYR 10ML LL (SYRINGE) ×4 IMPLANT
SYR 20ML LL LF (SYRINGE) ×8 IMPLANT
SYR 50ML LL SCALE MARK (SYRINGE) ×4 IMPLANT
TAPE CLOTH SURG 4X10 WHT LF (GAUZE/BANDAGES/DRESSINGS) ×2 IMPLANT
TOWEL OR 17X26 10 PK STRL BLUE (TOWEL DISPOSABLE) ×4 IMPLANT
TRAY FOLEY MTR SLVR 16FR STAT (SET/KITS/TRAYS/PACK) ×2 IMPLANT
TUBING CONNECTING 10 (TUBING) ×5 IMPLANT
TUBING CONNECTING 10' (TUBING) ×1
TUBING STONE CATCHER TRILOGY (MISCELLANEOUS) ×2 IMPLANT
TUBING UROLOGY SET (TUBING) ×4 IMPLANT
WATER STERILE IRR 1000ML POUR (IV SOLUTION) ×2 IMPLANT

## 2019-04-24 NOTE — Anesthesia Preprocedure Evaluation (Signed)
Anesthesia Evaluation  Patient identified by MRN, date of birth, ID band Patient awake    Reviewed: Allergy & Precautions, NPO status , Patient's Chart, lab work & pertinent test results  History of Anesthesia Complications (+) PONV and history of anesthetic complications  Airway Mallampati: II  TM Distance: >3 FB Neck ROM: Full    Dental  (+) Dental Advisory Given, Chipped, Poor Dentition   Pulmonary asthma , COPD,  COPD inhaler, former smoker,  01/10/2019 SARS coronavirus NEG   breath sounds clear to auscultation       Cardiovascular hypertension, Pt. on medications (-) angina Rhythm:Regular Rate:Normal     Neuro/Psych Anxiety Depression negative neurological ROS     GI/Hepatic Neg liver ROS, GERD  Medicated and Controlled,  Endo/Other  negative endocrine ROSobese  Renal/GU Renal disease     Musculoskeletal  (+) Arthritis , Osteoarthritis,    Abdominal (+) + obese,   Peds  Hematology negative hematology ROS (+)   Anesthesia Other Findings   Reproductive/Obstetrics                             Anesthesia Physical  Anesthesia Plan  ASA: III  Anesthesia Plan: General   Post-op Pain Management:    Induction: Intravenous  PONV Risk Score and Plan: 3 and Ondansetron, Dexamethasone, Midazolam and Treatment may vary due to age or medical condition  Airway Management Planned: LMA  Additional Equipment: None  Intra-op Plan:   Post-operative Plan: Extubation in OR  Informed Consent: I have reviewed the patients History and Physical, chart, labs and discussed the procedure including the risks, benefits and alternatives for the proposed anesthesia with the patient or authorized representative who has indicated his/her understanding and acceptance.     Dental advisory given  Plan Discussed with: CRNA  Anesthesia Plan Comments:         Anesthesia Quick Evaluation

## 2019-04-24 NOTE — Anesthesia Postprocedure Evaluation (Signed)
Anesthesia Post Note  Patient: Tony Douglas  Procedure(s) Performed: NEPHROLITHOTOMY PERCUTANEOUS WITH SURGEON ACCESS (Left ) CYSTOSCOPY FLEXIBLE     Patient location during evaluation: PACU Anesthesia Type: General Level of consciousness: awake and alert Pain management: pain level controlled Vital Signs Assessment: post-procedure vital signs reviewed and stable Respiratory status: spontaneous breathing, nonlabored ventilation, respiratory function stable and patient connected to nasal cannula oxygen Cardiovascular status: blood pressure returned to baseline and stable Postop Assessment: no apparent nausea or vomiting Anesthetic complications: no    Last Vitals:  Vitals:   04/24/19 1515 04/24/19 1530  BP: (!) 150/90 (!) 146/83  Pulse: 93 89  Resp: 13 14  Temp:    SpO2: 100% 100%    Last Pain:  Vitals:   04/24/19 1530  TempSrc:   PainSc: Sarepta DAVID

## 2019-04-24 NOTE — Op Note (Signed)
Pre-operative diagnosis: left lower pole stones, 2.5cm Post-operative diagnosis: as above   Procedure performed: cystoscopy, left retrograde pyelogram with interpretation, left percutaneous renal access, left nephrolithotomy, left nephrostogram,  Left ureteral stent placement, left nephrostomy tube placement.    Surgeon: Dr. Ardis Hughs  Assistant: None  Anesthesia: General  Complications: None  Specimens: The majority of the stones were removed and will be sent to the [facility] for further analysis.  Findings: 1.  Access obtained in the lower pole. 2.  Visualization was obscured by bleeding. 3.  26 French Ainsworth Foley catheter was left as a nephrostomy tube  EBL: Approximately 300 cc  Specimens: stone from collecting system - taken to [facility]  Indication: Tony Douglas is a 68 y.o. patient with a history of nephrolithiasis with a large stone burden in the left kidney. After reviewing the management options for treatment, he elected to proceed with the above surgical procedure(s). We have discussed the potential benefits and risks of the procedure, side effects of the proposed treatment, the likelihood of the patient achieving the goals of the procedure, and any potential problems that might occur during the procedure or recuperation. Informed consent has been obtained.   Description:  Consent was obtained in the preoperative holding area. The patient was marked appropriately and then taken back to the operating room where he was intubated on the gurney. The patient was flipped prone onto the split leg OR table. Large jelly rolls were placed in the anterior axillary line on both sides allowing the patient's chest and abdomen to fall inbetween. The patient was then prepped and draped in the routine sterile fashion in the left flank and genital area. A timeout was then held confirming the proper side and procedure as well as antibiotics were  administered.  I then used the flexible cystoscope and passed gently into the patient's urethra under visual guidance. Once into the bladder I grasped the stent emanating from the patient's left ureteral orifice and pull it to the urethral meatus. I then passed a wire through the stent and up into the left collecting system. I then removed the stent over the wire and exchanged for a 5 [place] open-ended ureteral Pollock catheter. The Pollack catheter was then advanced up to the UPJ. The wire was then removed and a retrograde pyelogram was performed with the above findings. I then turned my attention to the patient's left flank and obtaining percutaneous renal access.  I then used the flexible cystoscope and passed gently into the patient's urethra under visual guidance. Once into the bladder I grasped the stent emanating from the patient's left ureteral orifice and pull it to the urethral meatus. I then passed a wire through the stent and up into the left collecting system. I then removed the stent over the wire and exchanged for a 5 [place] open-ended ureteral Pollock catheter. The Pollack catheter was then advanced up to the UPJ. The wire was then removed and a retrograde pyelogram was performed with the above findings. I then turned my attention to the patient's left flank and obtaining percutaneous renal access.  Using the C-arm rotated at 25 and the bulls-eye technique with an 18-gauge coaxial needle the lower posterior lateral calyx was targeted. Then rotating the C-arm AP depth of our needle was noted to be within the calyx and the inner part of the coaxial needle was removed. Urine was noted to return. A 0.038 sensor wire was then passed through the sheath of  the coaxial needle and into the left renal collecting system. The wire was then passed down the ureter and into the bladder using fluoroscopic guide and the sheath of the needle was removed.  An angiographic catheter was then advanced into the  bladder and the wire removed.  A Super Stiff wire was then passed into the angiographic catheter and the angiographic catheter removed.  A dual-lumen catheter was then advanced over the wire and a second Super Stiff wire placed into the patient's bladder under fluoroscopic guidance, establishing 2 superstiff wires through the targeted calyx and into the bladder.   The 30 French NephroMax balloon was then passed over one of the Super Stiff wires and the tip guided down into the targeted calyx. The balloon was then inflated to approximately 12 atm, and once there was no waist noted under fluoroscopy the access sheath was advanced over the balloon. The balloon was then removed. The wires were then placed back into the sheaths and snapped to the drape.   Using the rigid nephroscope to explore the targeted calyx and kidney.  The stones were broken up with the trilogy then removed with a 2 prong grasper.  Then using a flexible cystoscope to navigate the remaining calyces of the kidney, and the upper pole stone was encountered.  Using the 0 tip basket these fragments were grabbed and removed. Contrast was injected through the cystoscope and the calyces systematically inspected under fluoroscopic guidance to ensure that all stone fragments had been removed.   Then using the flexible ureteroscope the ureter was navigated in antegrade fashion. All stone fragments were pushed from the ureter into the bladder. Once the ureter was clear the scope was advanced into the bladder and a 0.038 sensor wire was left in the bladder and the scope backed out over wire. The sensor wire was then backloaded over the rigid nephroscope using the stent pusher and a 28cm x 6 French double-J ureteral stent was passed antegrade over the sensor wire down into the bladder under fluoroscopic guidance. Once the stent was in the bladder the wire was gently pulled back and a nice curl noted in the bladder. The wire completely removed from the  stent, and nice curl on the proximal end of the stent was noted in the renal pelvis. The sheath was then backed out slowly to ensure that all calyces had been inspected and there was nothing behind the sheath.   A 24F ainsworth tip catheter was then passed over one of the Super Stiff wires through the sheath and into the renal pelvis. The sheath was then backed out of the kidney and cut over the red rubber catheter. A nephrostogram was then performed confirming the position of our nephrostomy tube and reassuring that there were no longer any filling defects from the patient's symptoms.  25 cc of local anesthesia was then injected into the patient's wound, and the wound was closed with 3-0 nylon in 2 vertical mattress sutures. The incision was then padded using a bundle of 4 x 4's and Hypafix tape. Patient was subsequently rolled over to the supine position and extubated. The patient was returned to the PACU in excellent condition. At the end of the case all lap and needle and sponges were accounted for. There are no perioperative complications.     

## 2019-04-24 NOTE — Anesthesia Procedure Notes (Signed)
Procedure Name: Intubation Date/Time: 04/24/2019 12:00 PM Performed by: [place] Caro Brundidge C, CRNA Pre-anesthesia Checklist: Patient identified, Emergency Drugs available, Suction available and Patient being monitored Patient Re-evaluated:Patient Re-evaluated prior to induction Oxygen Delivery Method: Circle system utilized Preoxygenation: Pre-oxygenation with 100% oxygen Induction Type: IV induction Ventilation: Mask ventilation without difficulty Laryngoscope Size: Mac and 4 Grade View: Grade II Tube type: Oral Tube size: 7.5 mm Number of attempts: 1 Airway Equipment and Method: Stylet and Oral airway Placement Confirmation: ETT inserted through vocal cords under direct vision,  positive ETCO2 and breath sounds checked- equal and bilateral Secured at: 22 cm Tube secured with: Tape Dental Injury: Teeth and Oropharynx as per pre-operative assessment

## 2019-04-24 NOTE — Interval H&P Note (Signed)
History and Physical Interval Note:  04/24/2019 11:33 AM  Tony Douglas  has presented today for surgery, with the diagnosis of LEFT RENAL STONE.  The various methods of treatment have been discussed with the patient and family. After consideration of risks, benefits and other options for treatment, the patient has consented to  Procedure(s): NEPHROLITHOTOMY PERCUTANEOUS WITH SURGEON ACCESS (Left) as a surgical intervention.  The patient's history has been reviewed, patient examined, no change in status, stable for surgery.  I have reviewed the patient's chart and labs.  Questions were answered to the patient's satisfaction.     Ardis Hughs

## 2019-04-24 NOTE — H&P (Signed)
s/p ureteroscopy  HPI: Tony Douglas is a 68 year-old male established patient who is here today for follow-up of recent ureteroscopy for stone extraction.  The patient underwent ureteroscopy on approximately 02/25/2019. He underwent a right sided procedure.   The patient did remove his stent at home. The stent was pulled on approximately 03/04/2019.   The patient denies any on-going flank pain following his procedure.  The patient underwent an ultrasound prior to this appointment today.   Stone composition: calcium oxalate.   Patient with 20mm stone in the left lower pole and 15mm stone in the mid-pole of the left kidney.     ALLERGIES: Bee stings    MEDICATIONS: Omeprazole 20 mg tablet, delayed release  Simvastatin 40 mg tablet  Bupropion Hcl Sr 150 mg tablet,sustained-release 12 hr  Doxazosin Mesylate 4 mg tablet  Dutasteride 0.5 mg capsule  Montelukast Sodium 10 mg tablet  Olmesartan-Hydrochlorothiazide 20 mg-12.5 mg tablet  Vitamin D3 50 mcg (2,000 unit) tablet     GU PSH: Ureteroscopic laser litho, Right - 03/06/2019     NON-GU PSH: Appendectomy Carpal tunnel surgery, Bilateral Knee replacement, Bilateral Rotator cuff surgery, Right     GU PMH: Ureteral calculus - 02/26/2019    NON-GU PMH: Arthritis GERD Hypercholesterolemia Hypertension    FAMILY HISTORY: None   SOCIAL HISTORY: Marital Status: Married Preferred Language: English; Ethnicity: Not Hispanic Or Latino; Race: White Current Smoking Status: Patient does not smoke anymore. Has not smoked since 02/01/1993.   Tobacco Use Assessment Completed: Used Tobacco in last 30 days? Has never drank.  Does not use drugs. Drinks 1 caffeinated drink per day.    REVIEW OF SYSTEMS:    GU Review Male:   Patient reports stream starts and stops. Patient denies frequent urination, hard to postpone urination, burning/ pain with urination, get up at night to urinate, leakage of urine, trouble starting your stream, have to  strain to urinate , erection problems, and penile pain.  Gastrointestinal (Upper):   Patient denies nausea, vomiting, and indigestion/ heartburn.  Gastrointestinal (Lower):   Patient denies diarrhea and constipation.  Constitutional:   Patient denies fever, night sweats, weight loss, and fatigue.  Skin:   Patient denies skin rash/ lesion and itching.  Eyes:   Patient denies blurred vision and double vision.  Ears/ Nose/ Throat:   Patient denies sore throat and sinus problems.  Hematologic/Lymphatic:   Patient denies swollen glands and easy bruising.  Cardiovascular:   Patient denies chest pains and leg swelling.  Respiratory:   Patient denies cough and shortness of breath.  Endocrine:   Patient denies excessive thirst.  Musculoskeletal:   Patient denies back pain and joint pain.  Neurological:   Patient denies headaches and dizziness.  Psychologic:   Patient denies depression and anxiety.   VITAL SIGNS:      04/08/2019 01:48 PM  Weight 223 lb / 101.15 kg  Height 70 in / 177.8 cm  BP 121/81 mmHg  Heart Rate 92 /min  Temperature 98.0 F / 36.6 C  BMI 32.0 kg/m   MULTI-SYSTEM PHYSICAL EXAMINATION:    Constitutional: Well-nourished. No physical deformities. Normally developed. Good grooming.  Respiratory: Normal breath sounds. No labored breathing, no use of accessory muscles.   Cardiovascular: Regular rate and rhythm. No murmur, no gallop. Normal temperature, normal extremity pulses, no swelling, no varicosities.      PAST DATA REVIEWED:  Source Of History:  Patient  Records Review:   Previous Doctor Records, Previous Patient Records, POC Tool  Urine Test Review:   Urinalysis   PROCEDURES:         KUB - [id number]  A single view of the abdomen is obtained. Renal shadows are easily visualized bilaterally. Overlying the patient's left lower pole there is a large stone burden. There is also visible stone in the upper pole of the left kidney. There are no additional calcifications along  the expected location of either ureter bilaterally.  Gas pattern is grossly normal. No significant bony abnormalities.      Impression: Large left-sided stone burden  Patient confirmed No Neulasta OnPro Device.             Renal Ultrasound - [id number]  Right kidney  Length: 12.02 cm Depth: 5.28 cm Cortical Width: 1.17 cm Width: 4.86 cm  Left Kidney Length: 12.21 cm Depth: 5.61 cm Cortical Width: 1.38 cm Width: 4.89cm   Left Kidney/Ureter:  1)? Area on [employer] measuring 2.82cm x 3.04cm x 2.40cm, no blood flow-----2)0.74cm Upper Pole Stone-----3)1 VS 3 stones in [place] - separately measuring 1.08cm, 0.77cm, and 0.60cm----4) Several Subcentimeter hyperechoic areas with and without shadowing - ? stones vs vascular calcifications   Right Kidney/Ureter:  1)? 0.57cm Upper to Mid Pole Stone-----2) Several Subcentimeter hyperechoic areas with and without shadowing largest in Upper Pole measuring 0.42cm- ? stones vs vascular calcifications   Bladder:  PVR = 58.50ml      Impression: No right-sided hydroureteronephrosis if  Patient confirmed No Neulasta OnPro Device.            Urinalysis Dipstick Dipstick Cont'd  Color: Yellow Bilirubin: Neg mg/dL  Appearance: Clear Ketones: Neg mg/dL  Specific Gravity: 1.025 Blood: Neg ery/uL  pH: <=5.0 Protein: Neg mg/dL  Glucose: Neg mg/dL Urobilinogen: 0.2 mg/dL    Nitrites: Neg    Leukocyte Esterase: Neg leu/uL    ASSESSMENT:      ICD-10 Details  1 GU:   Renal and ureteral calculus - N20.2    PLAN:           Document Letter(s):  Created for Patient: Clinical Summary         Notes:   The patient is stone free on his right side, but has a large stone burden on the left.   As it relates to the surgery itself I discussed the fact that the patient would be prone, and explained the renal access component. We then discussed the stone removal process as well as a postprocedure stent placement. I discussed the risks of this operation  which predominantly include bleeding and damage to the surrounding structures. I also described for him the possibility of difficulty obtaining access which may require an additional surgery. After going through surgery, potential complications, and expected outcome, the patient has agreed to proceed with the operation.

## 2019-04-24 NOTE — Transfer of Care (Signed)
Immediate Anesthesia Transfer of Care Note  Patient: Tony Douglas  Procedure(s) Performed: NEPHROLITHOTOMY PERCUTANEOUS WITH SURGEON ACCESS (Left ) CYSTOSCOPY FLEXIBLE  Patient Location: PACU  Anesthesia Type:General  Level of Consciousness: awake, alert , oriented and patient cooperative  Airway & Oxygen Therapy: Patient Spontanous Breathing and Patient connected to face mask oxygen  Post-op Assessment: Report given to RN, Post -op Vital signs reviewed and stable and Patient moving all extremities  Post vital signs: Reviewed and stable  Last Vitals:  Vitals Value Taken Time  BP 181/104 04/24/19 1453  Temp    Pulse 95 04/24/19 1455  Resp 12 04/24/19 1455  SpO2 100 % 04/24/19 1455  Vitals shown include unvalidated device data.  Last Pain:  Vitals:   04/24/19 0910  TempSrc: Oral         Complications: No apparent anesthesia complications

## 2019-04-25 ENCOUNTER — Other Ambulatory Visit: Payer: Self-pay

## 2019-04-25 ENCOUNTER — Encounter (HOSPITAL_COMMUNITY): Payer: Self-pay | Admitting: Urology

## 2019-04-25 DIAGNOSIS — F17210 Nicotine dependence, cigarettes, uncomplicated: Secondary | ICD-10-CM | POA: Diagnosis present

## 2019-04-25 DIAGNOSIS — Z7951 Long term (current) use of inhaled steroids: Secondary | ICD-10-CM

## 2019-04-25 DIAGNOSIS — Z20828 Contact with and (suspected) exposure to other viral communicable diseases: Secondary | ICD-10-CM | POA: Diagnosis not present

## 2019-04-25 DIAGNOSIS — K219 Gastro-esophageal reflux disease without esophagitis: Secondary | ICD-10-CM | POA: Diagnosis present

## 2019-04-25 DIAGNOSIS — R000 Tachycardia, unspecified: Secondary | ICD-10-CM | POA: Diagnosis not present

## 2019-04-25 DIAGNOSIS — Z8249 Family history of ischemic heart disease and other diseases of the circulatory system: Secondary | ICD-10-CM

## 2019-04-25 DIAGNOSIS — F329 Major depressive disorder, single episode, unspecified: Secondary | ICD-10-CM | POA: Diagnosis present

## 2019-04-25 DIAGNOSIS — Z87891 Personal history of nicotine dependence: Secondary | ICD-10-CM

## 2019-04-25 DIAGNOSIS — Z87442 Personal history of urinary calculi: Secondary | ICD-10-CM

## 2019-04-25 DIAGNOSIS — J189 Pneumonia, unspecified organism: Secondary | ICD-10-CM | POA: Diagnosis not present

## 2019-04-25 DIAGNOSIS — J45909 Unspecified asthma, uncomplicated: Secondary | ICD-10-CM | POA: Diagnosis present

## 2019-04-25 DIAGNOSIS — R509 Fever, unspecified: Secondary | ICD-10-CM | POA: Diagnosis not present

## 2019-04-25 DIAGNOSIS — N202 Calculus of kidney with calculus of ureter: Secondary | ICD-10-CM | POA: Diagnosis present

## 2019-04-25 DIAGNOSIS — G8918 Other acute postprocedural pain: Secondary | ICD-10-CM | POA: Diagnosis not present

## 2019-04-25 DIAGNOSIS — I10 Essential (primary) hypertension: Secondary | ICD-10-CM | POA: Diagnosis present

## 2019-04-25 DIAGNOSIS — Z96653 Presence of artificial knee joint, bilateral: Secondary | ICD-10-CM | POA: Diagnosis present

## 2019-04-25 DIAGNOSIS — Z91030 Bee allergy status: Secondary | ICD-10-CM

## 2019-04-25 DIAGNOSIS — K5909 Other constipation: Secondary | ICD-10-CM | POA: Diagnosis present

## 2019-04-25 DIAGNOSIS — E782 Mixed hyperlipidemia: Secondary | ICD-10-CM | POA: Diagnosis present

## 2019-04-25 DIAGNOSIS — J309 Allergic rhinitis, unspecified: Secondary | ICD-10-CM | POA: Diagnosis present

## 2019-04-25 DIAGNOSIS — Z96641 Presence of right artificial hip joint: Secondary | ICD-10-CM | POA: Diagnosis present

## 2019-04-25 DIAGNOSIS — N179 Acute kidney failure, unspecified: Secondary | ICD-10-CM | POA: Diagnosis present

## 2019-04-25 DIAGNOSIS — N12 Tubulo-interstitial nephritis, not specified as acute or chronic: Principal | ICD-10-CM | POA: Diagnosis present

## 2019-04-25 DIAGNOSIS — Z825 Family history of asthma and other chronic lower respiratory diseases: Secondary | ICD-10-CM

## 2019-04-25 LAB — BASIC METABOLIC PANEL
Anion gap: 12 (ref 5–15)
BUN: 20 mg/dL (ref 8–23)
CO2: 20 mmol/L — ABNORMAL LOW (ref 22–32)
Calcium: 9 mg/dL (ref 8.9–10.3)
Chloride: 103 mmol/L (ref 98–111)
Creatinine, Ser: 1.34 mg/dL — ABNORMAL HIGH (ref 0.61–1.24)
GFR calc Af Amer: >60 mL/min (ref >60)
GFR calc non Af Amer: 54 mL/min — ABNORMAL LOW (ref >60)
Glucose, Bld: 152 mg/dL — ABNORMAL HIGH (ref 70–99)
Potassium: 4.3 mmol/L (ref 3.5–5.1)
Sodium: 135 mmol/L (ref 135–145)

## 2019-04-25 LAB — CBC
HCT: 33.3 % — ABNORMAL LOW (ref 39.0–52.0)
Hemoglobin: 10.8 g/dL — ABNORMAL LOW (ref 13.0–17.0)
MCH: 29.5 pg (ref 26.0–34.0)
MCHC: 32.4 g/dL (ref 30.0–36.0)
MCV: 91 fL (ref 80.0–100.0)
Platelets: 203 10*3/uL (ref 150–400)
RBC: 3.66 MIL/uL — ABNORMAL LOW (ref 4.22–5.81)
RDW: 13.1 % (ref 11.5–15.5)
WBC: 10 10*3/uL (ref 4.0–10.5)
nRBC: 0 % (ref 0.0–0.2)

## 2019-04-25 MED ORDER — TRAMADOL HCL 50 MG PO TABS: 50.0000 mg | ORAL_TABLET | Freq: Four times a day (QID) | ORAL | 0 refills | Status: AC | PRN

## 2019-04-25 MED ORDER — CHLORHEXIDINE GLUCONATE CLOTH 2 % EX PADS
6.0000 | MEDICATED_PAD | Freq: Every day | CUTANEOUS | Status: DC
  Administered 2019-04-25: 6 via TOPICAL

## 2019-04-25 NOTE — Progress Notes (Signed)
Pt's foley catheter has been removed per orders.Pt voided very small amount of bloody urine and one medium sized clot noted. Continue to monitor Pt's voiding

## 2019-04-25 NOTE — Progress Notes (Signed)
Pt has voided 300 cc Urine lighter in color and no clots noted.

## 2019-04-25 NOTE — Discharge Instructions (Signed)
Discharge instructions following PCNL  Call your doctor for: Fevers greater than 100.5 Severe nausea or vomiting Increasing pain not controlled by pain medication Increasing redness or drainage from incisions Decreased urine output or a catheter is no longer draining  The number for questions is [phone].  Activity: Gradually increase activity with short frequent walks, 3-4 times a day.  Avoid strenuous activities, like sports, lawn-mowing, or heavy lifting (more than 10-15 pounds).  Wear loose, comfortable clothing that pull or kink the tube or tubes.  Do not drive while taking pain medication, or until your doctor permitts it.  Bathing and dressing changes: You should not shower for 48 hours after surgery.  Do not soak your back in a bathtub.  Diet: It is extremely important to drink plenty of fluids after surgery, especially water.  You may resume your regular diet, unless otherwise instructed.  Medications: May take Tylenol (acetaminophen) or ibuprofen (Advil, Motrin) as directed over-the-counter. Take any prescriptions as directed.  Follow-up appointments: Follow-up appointment will be scheduled with Dr. Fanta Wimberley in 10-14 days for hospital check and stent removal.  

## 2019-04-25 NOTE — Progress Notes (Signed)
Pt to be discharged to home this afternoon. Pt and Pt's Wife given discharge teaching including Medications reviewed and schedules reviewed. Pt verbalized understanding of all discharge teaching/instructions. Discharge packet with Pt at time of discharge

## 2019-04-25 NOTE — Progress Notes (Signed)
No acute events noted overnight.  Patient was complaining of some abdominal pain which he describes gas pain, ambulation seem to help.  Minimal flank pain noted.  No nausea or vomiting.  Vitals:   04/24/19 2045 04/25/19 0001 04/25/19 0639 04/25/19 0752  BP: (!) 139/94 (!) 143/90 119/79   Pulse: 94 98 (!) 110   Resp: 16 16 18    Temp: 98.9 F (37.2 C) 98.2 F (36.8 C) 98.8 F (37.1 C)   TempSrc: Oral Oral Oral   SpO2: 97% 94% 94% 94%  Weight:      Height:       I/O last 3 completed shifts: In: 3859.6 [P.O.:480; I.V.:3379.6] Out: 795 [Urine:795]   NAD Nonlabored breathing Abdominal exam soft Left neph tube site with some ecchymosis, otherwise no erythema or concern for infection.  Minimal drainage in the nephrostomy tube bag. Foley catheter was draining blood-tinged urine  Recent Labs    04/22/19 1158 04/24/19 1653 04/25/19 0518  WBC 5.7 10.8* 10.0  HGB 13.5 11.5* 10.8*  HCT 39.8 35.2* 33.3*   Recent Labs    04/22/19 1158 04/24/19 1653 04/25/19 0518  NA 137 136 135  K 3.5 4.1 4.3  CL 103 106 103  CO2 22 21* 20*  GLUCOSE 160* 176* 152*  BUN 17 16 20   CREATININE 1.17 1.14 1.34*  CALCIUM 9.6 8.5* 9.0   No results for input(s): LABPT, INR in the last 72 hours. No results for input(s): PSA in the last 72 hours. No results for input(s): LABURIN in the last 72 hours. Results for orders placed or performed during the hospital encounter of 04/22/19  Urine culture     Status: None   Collection Time: 04/22/19 11:55 AM   Specimen: Urine, Clean Catch  Result Value Ref Range Status   Specimen Description   Final    URINE, CLEAN CATCH Performed at [facility], [place] [address] [place], [place] [address]    Special Requests   Final    NONE Performed at [facility], [place] [address], [place], [place] [address]    Culture   Final    NO GROWTH Performed at [facility], [place] [address], [place], [place] [address]    Report  Status 04/23/2019 FINAL  Final    Imp: Status post left PCNL.  Doing okay, has had some tachycardia, presumably this is related to pain and anxiety.  Could also be associated with some acute surgical blood loss.  Nephrostomy tube was removed today on rounds.  Plan: Plan is to leave the Foley catheter in for couple hours and then remove it, allow the patient to void, and if he remains stable discharge around lunchtime today.

## 2019-04-25 NOTE — Discharge Summary (Signed)
Date of admission: 04/24/2019  Date of discharge: 04/25/2019  Admission diagnosis: left partial staghorn stone  Discharge diagnosis: same  Secondary diagnoses:  Patient Active Problem List   Diagnosis Date Noted  . Kidney stone 04/24/2019  . Primary localized osteoarthritis of left knee 12/31/2018  . Osteoarthritis of right hip 05/21/2014  . Cough 11/14/2011  . Hypertension 11/14/2011  . Asthma, extrinsic 11/14/2011    Procedures performed: Procedure(s): NEPHROLITHOTOMY PERCUTANEOUS WITH SURGEON ACCESS CYSTOSCOPY FLEXIBLE  History and Physical: For full details, please see admission history and physical. Briefly, Tony Douglas is a 68 y.o. year old patient with large left sided kidney stone.   Hospital Course: Patient tolerated the procedure well.  He was then transferred to the floor after an uneventful PACU stay.  His hospital course was uncomplicated.  On POD#1 he had met discharge criteria: was eating a regular diet, was up and ambulating independently,  pain was well controlled, was voiding without a catheter, and was ready to for discharge.   Laboratory values:  Recent Labs    04/22/19 1158 04/24/19 1653 04/25/19 0518  WBC 5.7 10.8* 10.0  HGB 13.5 11.5* 10.8*  HCT 39.8 35.2* 33.3*   Recent Labs    04/22/19 1158 04/24/19 1653 04/25/19 0518  NA 137 136 135  K 3.5 4.1 4.3  CL 103 106 103  CO2 22 21* 20*  GLUCOSE 160* 176* 152*  BUN '17 16 20  '$ CREATININE 1.17 1.14 1.34*  CALCIUM 9.6 8.5* 9.0   No results for input(s): LABPT, INR in the last 72 hours. No results for input(s): LABURIN in the last 72 hours. Results for orders placed or performed during the hospital encounter of 04/22/19  Urine culture     Status: None   Collection Time: 04/22/19 11:55 AM   Specimen: Urine, Clean Catch  Result Value Ref Range Status   Specimen Description   Final    URINE, CLEAN CATCH Performed at [facility], [place] [address] [place], [place] [address]     Special Requests   Final    NONE Performed at [facility], [place] [address], [place], [place] [address]    Culture   Final    NO GROWTH Performed at [facility], [place] [address], [place], [place] [address]    Report Status 04/23/2019 FINAL  Final    Disposition: Home  Discharge instruction: The patient was instructed to be ambulatory but told to refrain from heavy lifting, strenuous activity, or driving.   Discharge medications:  Allergies as of 04/25/2019      Reactions   Bee Venom Anaphylaxis      Medication List    TAKE these medications   acetaminophen 650 MG CR tablet Commonly known as: TYLENOL Take 1,300 mg by mouth every 8 (eight) hours as needed for pain.   budesonide-formoterol 160-4.5 MCG/ACT inhaler Commonly known as: Symbicort Take 2 puffs first thing in am and then another 2 puffs about 12 hours later. What changed:   how much to take  how to take this  when to take this  additional instructions   buPROPion 150 MG 12 hr tablet Commonly known as: WELLBUTRIN SR Take 150 mg by mouth 2 (two) times daily.   doxazosin 4 MG tablet Commonly known as: CARDURA Take 4 mg by mouth at bedtime.   dutasteride 0.5 MG capsule Commonly known as: AVODART Take 0.5 mg by mouth at bedtime.   EpiPen 2-Pak 0.3 mg/0.3 mL Soaj injection  Generic drug: EPINEPHrine Inject 0.3 mg into the muscle as needed for anaphylaxis.   fluticasone 50 MCG/ACT nasal spray Commonly known as: FLONASE Place 2 sprays into both nostrils daily.   gabapentin 300 MG capsule Commonly known as: NEURONTIN 1 tablet before bed for nerve pain   montelukast 10 MG tablet Commonly known as: SINGULAIR Take 1 tablet (10 mg total) by mouth at bedtime.   olmesartan-hydrochlorothiazide 20-12.5 MG tablet Commonly known as: BENICAR HCT Take 0.5 tablets by mouth daily.   omeprazole 20 MG capsule Commonly known as: PRILOSEC Take 20 mg by mouth daily.   ondansetron 4 MG  tablet Commonly known as: ZOFRAN Take 1 tablet (4 mg total) by mouth every 6 (six) hours. What changed:   when to take this  reasons to take this   polyethylene glycol 17 g packet Commonly known as: MIRALAX / GLYCOLAX 17grams in 6 oz of something to drink twice a day until bowel movement.  LAXITIVE.  Restart if two days since last bowel movement What changed:   how much to take  how to take this  when to take this  additional instructions   simvastatin 40 MG tablet Commonly known as: ZOCOR Take 40 mg by mouth daily.   traMADol 50 MG tablet Commonly known as: Ultram Take 1-2 tablets (50-100 mg total) by mouth every 6 (six) hours as needed for moderate pain.   Vitamin D 125 MCG (5000 UT) Caps Take 5,000 Units by mouth daily.       Followup:  Follow-up Information    Karen Kays, NP On 05/08/2019.   Specialty: Nurse Practitioner Why: Stent removal Contact information: [address] [place] [address] [phone]

## 2019-04-25 NOTE — ED Triage Notes (Signed)
Patient had surgery on Wednesday 04/24/2019 for RIGHT URETEROSCOPY/HOLMIUM LASER/STENT PLACEMENT STONE BASKET RETRIVAL. Patient states he developed a fever 102.1 at home but took tylenol. Patient is having a lot of drainage on lower right back. Patient states pain has increased when he stands up. Tony Douglas

## 2019-04-26 ENCOUNTER — Inpatient Hospital Stay (HOSPITAL_COMMUNITY)
Admission: EM | Admit: 2019-04-26 | Discharge: 2019-04-28 | DRG: 660 | Disposition: A | Discharge status: Home / Self Care | Payer: Medicare Other | Attending: Urology | Admitting: Urology

## 2019-04-26 ENCOUNTER — Emergency Department (HOSPITAL_COMMUNITY): Payer: Medicare Other

## 2019-04-26 ENCOUNTER — Encounter (HOSPITAL_COMMUNITY): Payer: Self-pay | Admitting: Emergency Medicine

## 2019-04-26 ENCOUNTER — Other Ambulatory Visit: Payer: Self-pay

## 2019-04-26 DIAGNOSIS — J189 Pneumonia, unspecified organism: Secondary | ICD-10-CM

## 2019-04-26 DIAGNOSIS — R509 Fever, unspecified: Secondary | ICD-10-CM | POA: Diagnosis not present

## 2019-04-26 DIAGNOSIS — R109 Unspecified abdominal pain: Secondary | ICD-10-CM | POA: Diagnosis not present

## 2019-04-26 DIAGNOSIS — G8918 Other acute postprocedural pain: Secondary | ICD-10-CM

## 2019-04-26 LAB — SARS CORONAVIRUS 2 BY RT PCR (HOSPITAL ORDER, PERFORMED IN ~~LOC~~ HOSPITAL LAB): SARS Coronavirus 2: NEGATIVE

## 2019-04-26 LAB — CBC WITH DIFFERENTIAL/PLATELET
Abs Immature Granulocytes: 0.02 10*3/uL (ref 0.00–0.07)
Basophils Absolute: 0 10*3/uL (ref 0.0–0.1)
Basophils Relative: 0 %
Eosinophils Absolute: 0 10*3/uL (ref 0.0–0.5)
Eosinophils Relative: 0 %
HCT: 31.3 % — ABNORMAL LOW (ref 39.0–52.0)
Hemoglobin: 10.3 g/dL — ABNORMAL LOW (ref 13.0–17.0)
Immature Granulocytes: 0 %
Lymphocytes Relative: 11 %
Lymphs Abs: 1.3 10*3/uL (ref 0.7–4.0)
MCH: 29.5 pg (ref 26.0–34.0)
MCHC: 32.9 g/dL (ref 30.0–36.0)
MCV: 89.7 fL (ref 80.0–100.0)
Monocytes Absolute: 1.2 10*3/uL — ABNORMAL HIGH (ref 0.1–1.0)
Monocytes Relative: 11 %
Neutro Abs: 8.8 10*3/uL — ABNORMAL HIGH (ref 1.7–7.7)
Neutrophils Relative %: 78 %
Platelets: 189 10*3/uL (ref 150–400)
RBC: 3.49 MIL/uL — ABNORMAL LOW (ref 4.22–5.81)
RDW: 13.4 % (ref 11.5–15.5)
WBC: 11.4 10*3/uL — ABNORMAL HIGH (ref 4.0–10.5)
nRBC: 0 % (ref 0.0–0.2)

## 2019-04-26 LAB — COMPREHENSIVE METABOLIC PANEL
ALT: 20 U/L (ref 0–44)
AST: 16 U/L (ref 15–41)
Albumin: 3.7 g/dL (ref 3.5–5.0)
Alkaline Phosphatase: 37 U/L — ABNORMAL LOW (ref 38–126)
Anion gap: 10 (ref 5–15)
BUN: 19 mg/dL (ref 8–23)
CO2: 22 mmol/L (ref 22–32)
Calcium: 9.3 mg/dL (ref 8.9–10.3)
Chloride: 102 mmol/L (ref 98–111)
Creatinine, Ser: 1.74 mg/dL — ABNORMAL HIGH (ref 0.61–1.24)
GFR calc Af Amer: 46 mL/min — ABNORMAL LOW (ref >60)
GFR calc non Af Amer: 39 mL/min — ABNORMAL LOW (ref >60)
Glucose, Bld: 122 mg/dL — ABNORMAL HIGH (ref 70–99)
Potassium: 3.7 mmol/L (ref 3.5–5.1)
Sodium: 134 mmol/L — ABNORMAL LOW (ref 135–145)
Total Bilirubin: 0.7 mg/dL (ref 0.3–1.2)
Total Protein: 6.3 g/dL — ABNORMAL LOW (ref 6.5–8.1)

## 2019-04-26 LAB — LACTIC ACID, PLASMA
Lactic Acid, Venous: 1.3 mmol/L (ref 0.5–1.9)
Lactic Acid, Venous: 1.1 mmol/L (ref 0.5–1.9)

## 2019-04-26 LAB — URINALYSIS, ROUTINE W REFLEX MICROSCOPIC
Bilirubin Urine: NEGATIVE
Glucose, UA: NEGATIVE mg/dL
Ketones, ur: NEGATIVE mg/dL
Nitrite: NEGATIVE
Protein, ur: 100 mg/dL — AB
RBC / HPF: >50 RBC/hpf — ABNORMAL HIGH (ref 0–5)
Specific Gravity, Urine: 1.01 (ref 1.005–1.030)
pH: 5 (ref 5.0–8.0)

## 2019-04-26 LAB — PROTIME-INR
INR: 1.1 (ref 0.8–1.2)
Prothrombin Time: 14 seconds (ref 11.4–15.2)

## 2019-04-26 LAB — HIV ANTIBODY (ROUTINE TESTING W REFLEX): HIV Screen 4th Generation wRfx: NONREACTIVE

## 2019-04-26 LAB — APTT: aPTT: 31 seconds (ref 24–36)

## 2019-04-26 MED ORDER — MOMETASONE FURO-FORMOTEROL FUM 200-5 MCG/ACT IN AERO
2.0000 | INHALATION_SPRAY | Freq: Two times a day (BID) | RESPIRATORY_TRACT | Status: DC
  Administered 2019-04-26 – 2019-04-28 (×5): 2 via RESPIRATORY_TRACT
  Filled 2019-04-26: qty 8.8

## 2019-04-26 MED ORDER — SENNOSIDES-DOCUSATE SODIUM 8.6-50 MG PO TABS: 1.0000 | ORAL_TABLET | Freq: Every evening | ORAL | Status: DC | PRN

## 2019-04-26 MED ORDER — CEFTRIAXONE SODIUM 1 G IJ SOLR: 1.0000 g | INTRAMUSCULAR | Status: DC

## 2019-04-26 MED ORDER — BUPROPION HCL ER (SR) 150 MG PO TB12
150.0000 mg | ORAL_TABLET | Freq: Two times a day (BID) | ORAL | Status: DC
  Administered 2019-04-26 – 2019-04-28 (×5): 150 mg via ORAL
  Filled 2019-04-26 (×5): qty 1

## 2019-04-26 MED ORDER — IRBESARTAN 150 MG PO TABS
150.0000 mg | ORAL_TABLET | Freq: Every day | ORAL | Status: DC
  Administered 2019-04-26 – 2019-04-28 (×3): 150 mg via ORAL
  Filled 2019-04-26 (×4): qty 1

## 2019-04-26 MED ORDER — DIPHENHYDRAMINE HCL 12.5 MG/5ML PO ELIX: 12.5000 mg | ORAL_SOLUTION | Freq: Four times a day (QID) | ORAL | Status: DC | PRN

## 2019-04-26 MED ORDER — SODIUM CHLORIDE 0.9 % IV SOLN
2.0000 g | INTRAVENOUS | Status: DC
  Administered 2019-04-26 – 2019-04-27 (×2): 2 g via INTRAVENOUS
  Filled 2019-04-26: qty 2
  Filled 2019-04-26 (×2): qty 20

## 2019-04-26 MED ORDER — METRONIDAZOLE IN NACL 5-0.79 MG/ML-% IV SOLN
500.0000 mg | Freq: Once | INTRAVENOUS | Status: AC
  Administered 2019-04-26: 02:00:00 500 mg via INTRAVENOUS
  Filled 2019-04-26: qty 100

## 2019-04-26 MED ORDER — HYDROMORPHONE HCL 1 MG/ML IJ SOLN: 0.5000 mg | INTRAMUSCULAR | Status: DC | PRN

## 2019-04-26 MED ORDER — HYDROCHLOROTHIAZIDE 12.5 MG PO CAPS
12.5000 mg | ORAL_CAPSULE | Freq: Every day | ORAL | Status: DC
  Administered 2019-04-26 – 2019-04-28 (×3): 12.5 mg via ORAL
  Filled 2019-04-26 (×3): qty 1

## 2019-04-26 MED ORDER — ZOLPIDEM TARTRATE 5 MG PO TABS: 5.0000 mg | ORAL_TABLET | Freq: Every evening | ORAL | Status: DC | PRN

## 2019-04-26 MED ORDER — BISACODYL 10 MG RE SUPP: 10.0000 mg | Freq: Every day | RECTAL | Status: DC | PRN

## 2019-04-26 MED ORDER — OXYCODONE-ACETAMINOPHEN 5-325 MG PO TABS
1.0000 | ORAL_TABLET | ORAL | Status: DC | PRN
  Administered 2019-04-26: 14:00:00 1 via ORAL
  Administered 2019-04-26: 08:00:00 2 via ORAL
  Filled 2019-04-26 (×2): qty 2

## 2019-04-26 MED ORDER — PANTOPRAZOLE SODIUM 40 MG PO TBEC
40.0000 mg | DELAYED_RELEASE_TABLET | Freq: Every day | ORAL | Status: DC
  Administered 2019-04-26 – 2019-04-28 (×3): 40 mg via ORAL
  Filled 2019-04-26 (×3): qty 1

## 2019-04-26 MED ORDER — SIMVASTATIN 40 MG PO TABS
40.0000 mg | ORAL_TABLET | Freq: Every day | ORAL | Status: DC
  Administered 2019-04-26 – 2019-04-28 (×3): 40 mg via ORAL
  Filled 2019-04-26 (×4): qty 1

## 2019-04-26 MED ORDER — FENTANYL CITRATE (PF) 100 MCG/2ML IJ SOLN
100.0000 ug | Freq: Once | INTRAMUSCULAR | Status: AC
  Administered 2019-04-26: 02:00:00 100 ug via INTRAVENOUS
  Filled 2019-04-26: qty 2

## 2019-04-26 MED ORDER — DIPHENHYDRAMINE HCL 50 MG/ML IJ SOLN: 12.5000 mg | Freq: Four times a day (QID) | INTRAMUSCULAR | Status: DC | PRN

## 2019-04-26 MED ORDER — ONDANSETRON HCL 4 MG/2ML IJ SOLN: 4.0000 mg | INTRAMUSCULAR | Status: DC | PRN

## 2019-04-26 MED ORDER — OLMESARTAN MEDOXOMIL-HCTZ 20-12.5 MG PO TABS: 0.5000 | ORAL_TABLET | Freq: Every day | ORAL | Status: DC

## 2019-04-26 MED ORDER — MONTELUKAST SODIUM 10 MG PO TABS
10.0000 mg | ORAL_TABLET | Freq: Every day | ORAL | Status: DC
  Administered 2019-04-26 – 2019-04-27 (×2): 10 mg via ORAL
  Filled 2019-04-26 (×2): qty 1

## 2019-04-26 MED ORDER — SODIUM CHLORIDE 0.9 % IV BOLUS
1000.0000 mL | Freq: Once | INTRAVENOUS | Status: AC
  Administered 2019-04-26: 03:00:00 1000 mL via INTRAVENOUS

## 2019-04-26 MED ORDER — VANCOMYCIN HCL IN DEXTROSE 1-5 GM/200ML-% IV SOLN: 1000.0000 mg | Freq: Once | INTRAVENOUS | Status: DC

## 2019-04-26 MED ORDER — SODIUM CHLORIDE 0.9 % IV SOLN
2.0000 g | Freq: Once | INTRAVENOUS | Status: AC
  Administered 2019-04-26: 02:00:00 2 g via INTRAVENOUS
  Filled 2019-04-26: qty 2

## 2019-04-26 MED ORDER — SODIUM CHLORIDE 0.9 % IV SOLN
INTRAVENOUS | Status: DC
  Administered 2019-04-26 – 2019-04-27 (×4): via INTRAVENOUS

## 2019-04-26 MED ORDER — FENTANYL CITRATE (PF) 100 MCG/2ML IJ SOLN
50.0000 ug | Freq: Once | INTRAMUSCULAR | Status: AC
  Administered 2019-04-26: 50 ug via INTRAVENOUS
  Filled 2019-04-26: qty 2

## 2019-04-26 MED ORDER — FLUTICASONE PROPIONATE 50 MCG/ACT NA SUSP
1.0000 | Freq: Two times a day (BID) | NASAL | Status: DC
  Administered 2019-04-26 – 2019-04-28 (×2): 1 via NASAL
  Filled 2019-04-26: qty 16

## 2019-04-26 MED ORDER — ACETAMINOPHEN 325 MG PO TABS: 650.0000 mg | ORAL_TABLET | ORAL | Status: DC | PRN

## 2019-04-26 MED ORDER — DUTASTERIDE 0.5 MG PO CAPS
0.5000 mg | ORAL_CAPSULE | Freq: Every day | ORAL | Status: DC
  Administered 2019-04-26 – 2019-04-27 (×2): 0.5 mg via ORAL
  Filled 2019-04-26 (×3): qty 1

## 2019-04-26 MED ORDER — VANCOMYCIN HCL 10 G IV SOLR
2000.0000 mg | Freq: Once | INTRAVENOUS | Status: AC
  Administered 2019-04-26: 03:00:00 2000 mg via INTRAVENOUS
  Filled 2019-04-26: qty 2000

## 2019-04-26 MED ORDER — DOXAZOSIN MESYLATE 2 MG PO TABS
4.0000 mg | ORAL_TABLET | Freq: Every day | ORAL | Status: DC
  Administered 2019-04-26 – 2019-04-27 (×2): 4 mg via ORAL
  Filled 2019-04-26 (×2): qty 2

## 2019-04-26 MED ORDER — MAGNESIUM CITRATE PO SOLN: 1.0000 | Freq: Once | ORAL | Status: DC | PRN

## 2019-04-26 NOTE — Progress Notes (Signed)
A consult was received from an ED physician for Vancomycin, cefepime per pharmacy dosing.  The patient's profile has been reviewed for ht/wt/allergies/indication/available labs.   A one time order has been placed for Vancomycin 2gm iv x1, and Cefepime 2gm iv x1.  Further antibiotics/pharmacy consults should be ordered by admitting physician if indicated.                       Thank you, Nani Skillern Crowford 04/26/2019  1:53 AM

## 2019-04-26 NOTE — ED Notes (Signed)
Called 4E to give report, RN-Alexis unavailable, will return call

## 2019-04-26 NOTE — H&P (Signed)
Urology Admission H&P  Chief Complaint: Flank pain/fever  History of Present Illness:  83M s/p left PCNL on Thursday.  Surgery largely unremarkable, nephrostomy was left in place overnight and removed on POD#1.  Patient's hospital course was routine and he was discharged home yesterday around lunch time.  Over the subsequent hours he was noted to have leakage from his left flank, worsening pain and then developed a fever to 102.  He represented to the [facility] ED for further evaluation. In the ED he had a urine analysis that was consistent with recent urologic surgery.  CT scan shows stranding around left kidney but stent well positioned.  Renal function worse compared to his baseline.  He was started on IV antibiotics and readmitted for observation.  Past Medical History:  Diagnosis Date  . Allergic rhinitis   . Asthma    followed by dr wert as needed, per pt controlled  . Benign localized prostatic hyperplasia with lower urinary tract symptoms (LUTS)   . Chronic constipation   . Diverticulosis of colon   . GERD (gastroesophageal reflux disease)   . History of carpal tunnel syndrome    Bilateral  . History of kidney stones   . Hyperlipidemia, mixed   . Hypertension   . MDD (major depressive disorder)   . Nephrolithiasis    per CT 02-11-2019 bilateral non-obstructive  . Osteoarthritis   . PONV (postoperative nausea and vomiting)   . Right ureteral stone   . Wears contact lenses    Past Surgical History:  Procedure Laterality Date  . APPENDECTOMY  1970s  . CARPAL TUNNEL RELEASE Left 04/20/2017   Procedure: LEFT CARPAL TUNNEL RELEASE;  Surgeon: [name], MD;  Location: [facility];  Service: Orthopedics;  Laterality: Left;  . CARPAL TUNNEL RELEASE Right 05/04/2017   Procedure: CARPAL TUNNEL RELEASE;  Surgeon: [name], MD;  Location: [facility];  Service: Orthopedics;  Laterality: Right;  . COLONOSCOPY    . CYSTOSCOPY  04/24/2019   Procedure:  CYSTOSCOPY FLEXIBLE;  Surgeon: Ardis Hughs, MD;  Location: [facility];  Service: Urology;;  . CYSTOSCOPY/URETEROSCOPY/HOLMIUM LASER/STENT PLACEMENT Right 03/06/2019   Procedure: RIGHT URETEROSCOPY/HOLMIUM LASER/STENT PLACEMENT STONE BASKET RETRIVAL;  Surgeon: Ardis Hughs, MD;  Location: [facility];  Service: Urology;  Laterality: Right;  . HAMMER TOE SURGERY Right yrs ago   2nd toe  . INGUINAL HERNIA REPAIR Right 09-17-2003  @[facility]   . KNEE ARTHROSCOPY Right 2002   w/ post op arthroscopy debridement/ lavage 07-30-2000  . KNEE ARTHROSCOPY WITH MEDIAL MENISECTOMY Left 01/14/2019   Procedure: KNEE ARTHROSCOPY WITH MEDIAL MENISECTOMY VERSES A POSSIBLE UNIT ARTHROPLASTY POSSIBLE A TOTAL ARTHROPLASTY;  Surgeon: Elsie Saas, MD;  Location: [facility];  Service: Orthopedics;  Laterality: Left;  . LAPAROSCOPIC SIGMOID COLECTOMY  02-12-2003   @[facility]    diverticulosis  . NEPHROLITHOTOMY Left 04/24/2019   Procedure: NEPHROLITHOTOMY PERCUTANEOUS WITH SURGEON ACCESS;  Surgeon: Ardis Hughs, MD;  Location: [facility];  Service: Urology;  Laterality: Left;  . SHOULDER ARTHROSCOPY DISTAL CLAVICLE EXCISION AND OPEN ROTATOR CUFF REPAIR Right 01-17-2008   dr Percell Miller  . TOTAL HIP ARTHROPLASTY Right 05/21/2014   Procedure: RIGHT TOTAL HIP ARTHROPLASTY;  Surgeon: [name], MD;  Location: [facility];  Service: Orthopedics;  Laterality: Right;  . TOTAL KNEE ARTHROPLASTY Right 12-16-2009   @[facility]   . TOTAL KNEE ARTHROPLASTY Left 01/14/2019   Procedure: TOTAL KNEE ARTHROPLASTY;  Surgeon: Elsie Saas, MD;  Location[facility];  Service: Orthopedics;  Laterality: Left;    Home Medications:  Current Facility-Administered Medications  Medication Dose Route Frequency Provider Last Rate Last Dose  . 0.9 %  sodium chloride infusion   Intravenous Continuous Yonael Tulloch, Candee Furbish, MD      . acetaminophen (TYLENOL) tablet 650 mg  650 mg Oral Q4H PRN Cleon Gustin, MD      . bisacodyl (DULCOLAX)  suppository 10 mg  10 mg Rectal Daily PRN Cleon Gustin, MD      . buPROPion [facility] SR) 12 hr tablet 150 mg  150 mg Oral BID Cleon Gustin, MD      . diphenhydrAMINE (BENADRYL) injection 12.5 mg  12.5 mg Intravenous Q6H PRN Elliona Doddridge, Candee Furbish, MD       Or  . diphenhydrAMINE (BENADRYL) 12.5 MG/5ML elixir 12.5 mg  12.5 mg Oral Q6H PRN Jerianne Anselmo, Candee Furbish, MD      . doxazosin (CARDURA) tablet 4 mg  4 mg Oral QHS Raequon Catanzaro, Candee Furbish, MD      . dutasteride (AVODART) capsule 0.5 mg  0.5 mg Oral QHS Abdur Hoglund, Candee Furbish, MD      . fentaNYL (SUBLIMAZE) injection 50 mcg  50 mcg Intravenous Once Palumbo, April, MD      . fluticasone (FLONASE) 50 MCG/ACT nasal spray 1 spray  1 spray Each Nare BID Ilani Otterson, Candee Furbish, MD      . HYDROmorphone (DILAUDID) injection 0.5-1 mg  0.5-1 mg Intravenous Q2H PRN Devrin Monforte, Candee Furbish, MD      . magnesium citrate solution 1 Bottle  1 Bottle Oral Once PRN Kayli Beal, Candee Furbish, MD      . mometasone-formoterol (DULERA) 200-5 MCG/ACT inhaler 2 puff  2 puff Inhalation BID Cleon Gustin, MD      . montelukast (SINGULAIR) tablet 10 mg  10 mg Oral QHS Teodor Prater, Candee Furbish, MD      . olmesartan-hydrochlorothiazide (BENICAR HCT) 20-12.5 MG per tablet 0.5 tablet  0.5 tablet Oral Daily Kristell Wooding, Candee Furbish, MD      . ondansetron [facility]) injection 4 mg  4 mg Intravenous Q4H PRN Ashland Wiseman, Candee Furbish, MD      . oxyCODONE-acetaminophen (PERCOCET/ROXICET) 5-325 MG per tablet 1-2 tablet  1-2 tablet Oral Q4H PRN Calil Amor, Candee Furbish, MD      . pantoprazole (PROTONIX) EC tablet 40 mg  40 mg Oral Daily Toben Acuna, Candee Furbish, MD      . senna-docusate (Senokot-S) tablet 1 tablet  1 tablet Oral QHS PRN Cleon Gustin, MD      . simvastatin (ZOCOR) tablet 40 mg  40 mg Oral Daily Cleon Gustin, MD      . vancomycin (VANCOCIN) 2,000 mg in sodium chloride 0.9 % 500 mL IVPB  2,000 mg Intravenous Once Palumbo, April, MD 250 mL/hr at 04/26/19 0246 2,000 mg at 04/26/19 0246  .  zolpidem (AMBIEN) tablet 5 mg  5 mg Oral QHS PRN Cleon Gustin, MD       Current Outpatient Medications  Medication Sig Dispense Refill Last Dose  . acetaminophen (TYLENOL) 650 MG CR tablet Take 1,300 mg by mouth every 8 (eight) hours as needed for pain.   04/25/2019 at Unknown time  . budesonide-formoterol (SYMBICORT) 160-4.5 MCG/ACT inhaler Take 2 puffs first thing in am and then another 2 puffs about 12 hours later. (Patient taking differently: Inhale 2 puffs into the lungs 2 (two) times a day. ) 1 Inhaler 0 04/25/2019 at Unknown time  . buPROPion (WELLBUTRIN SR) 150 MG 12 hr tablet  Take 150 mg by mouth 2 (two) times daily.   04/25/2019 at Unknown time  . Cholecalciferol (VITAMIN D) 125 MCG (5000 UT) CAPS Take 5,000 Units by mouth daily.    04/25/2019 at Unknown time  . doxazosin (CARDURA) 4 MG tablet Take 4 mg by mouth at bedtime.   Past Week at Unknown time  . dutasteride (AVODART) 0.5 MG capsule Take 0.5 mg by mouth at bedtime.    Past Week at Unknown time  . EPIPEN 2-PAK 0.3 MG/0.3ML SOAJ injection Inject 0.3 mg into the muscle as needed for anaphylaxis.      . fluticasone (FLONASE) 50 MCG/ACT nasal spray Place 1 spray into both nostrils 2 (two) times daily.    04/25/2019 at Unknown time  . montelukast (SINGULAIR) 10 MG tablet Take 1 tablet (10 mg total) by mouth at bedtime. 30 tablet 11 04/25/2019 at Unknown time  . olmesartan-hydrochlorothiazide (BENICAR HCT) 20-12.5 MG tablet Take 0.5 tablets by mouth daily.   04/25/2019 at Unknown time  . omeprazole (PRILOSEC) 20 MG capsule Take 20 mg by mouth daily.    04/25/2019 at Unknown time  . ondansetron (ZOFRAN) 4 MG tablet Take 1 tablet (4 mg total) by mouth every 6 (six) hours. (Patient taking differently: Take 4 mg by mouth every 8 (eight) hours as needed for nausea or vomiting. ) 12 tablet 0 04/25/2019 at Unknown time  . polyethylene glycol (MIRALAX / GLYCOLAX) 17 g packet 17grams in 6 oz of something to drink twice a day until bowel  movement.  LAXITIVE.  Restart if two days since last bowel movement (Patient taking differently: Take 17 g by mouth every 3 (three) days. ) 14 each 0 Past Week at Unknown time  . simvastatin (ZOCOR) 40 MG tablet Take 40 mg by mouth daily.    04/25/2019 at Unknown time  . traMADol (ULTRAM) 50 MG tablet Take 1-2 tablets (50-100 mg total) by mouth every 6 (six) hours as needed for moderate pain. 15 tablet 0 not started at Unknown time  . gabapentin (NEURONTIN) 300 MG capsule 1 tablet before bed for nerve pain (Patient not taking: Reported on 04/17/2019) 30 capsule 0    Allergies:  Allergies  Allergen Reactions  . Bee Venom Anaphylaxis    Family History  Problem Relation Age of Onset  . Emphysema Father        smoked and worked in [employer]  . Allergies Brother   . Asthma Brother   . Heart disease Mother    Social History:  reports that he quit smoking about 26 years ago. His smoking use included cigarettes. He has a 30.00 pack-year smoking history. He has never used smokeless tobacco. He reports previous alcohol use. He reports that he does not use drugs.  Review of Systems  Constitutional: Positive for fever.  Genitourinary: Positive for flank pain.  All other systems reviewed and are negative.   Physical Exam:  Vital signs in last 24 hours: Temp:  [98.8 F (37.1 C)-99.7 F (37.6 C)] 98.9 F (37.2 C) (10/23 0333) Pulse Rate:  [100-123] 113 (10/23 0333) Resp:  [16-23] 20 (10/23 0333) BP: (114-171)/(66-124) 131/81 (10/23 0333) SpO2:  [90 %-100 %] 93 % (10/23 0333) Weight:  [99.8 kg-100.9 kg] 99.8 kg (10/23 0222) Physical Exam  Constitutional: He is oriented to person, place, and time. He appears well-developed and well-nourished.  HENT:  Head: Normocephalic and atraumatic.  Eyes: Pupils are equal, round, and reactive to light. EOM are normal.  Neck: Normal range of motion. No  thyromegaly present.  Cardiovascular: Normal rate and regular rhythm.  Respiratory: Effort normal. No  respiratory distress.  GI: Soft. He exhibits no distension.  Musculoskeletal: Normal range of motion.        General: No edema.  Neurological: He is alert and oriented to person, place, and time.  Skin: Skin is warm and dry.  Psychiatric: He has a normal mood and affect. His behavior is normal. Judgment and thought content normal.    Laboratory Data:  Results for orders placed or performed during the hospital encounter of 04/26/19 (from the past 24 hour(s))  Urinalysis, Routine w reflex microscopic     Status: Abnormal   Collection Time: 04/26/19  1:30 AM  Result Value Ref Range   Color, Urine YELLOW YELLOW   APPearance HAZY (A) CLEAR   Specific Gravity, Urine 1.010 1.005 - 1.030   pH 5.0 5.0 - 8.0   Glucose, UA NEGATIVE NEGATIVE mg/dL   Hgb urine dipstick LARGE (A) NEGATIVE   Bilirubin Urine NEGATIVE NEGATIVE   Ketones, ur NEGATIVE NEGATIVE mg/dL   Protein, ur 100 (A) NEGATIVE mg/dL   Nitrite NEGATIVE NEGATIVE   Leukocytes,Ua TRACE (A) NEGATIVE   RBC / HPF >50 (H) 0 - 5 RBC/hpf   WBC, UA 21-50 0 - 5 WBC/hpf   Bacteria, UA RARE (A) NONE SEEN   Mucus PRESENT   Lactic acid, plasma     Status: None   Collection Time: 04/26/19  1:45 AM  Result Value Ref Range   Lactic Acid, Venous 1.3 0.5 - 1.9 mmol/L  Comprehensive metabolic panel     Status: Abnormal   Collection Time: 04/26/19  1:45 AM  Result Value Ref Range   Sodium 134 (L) 135 - 145 mmol/L   Potassium 3.7 3.5 - 5.1 mmol/L   Chloride 102 98 - 111 mmol/L   CO2 22 22 - 32 mmol/L   Glucose, Bld 122 (H) 70 - 99 mg/dL   BUN 19 8 - 23 mg/dL   Creatinine, Ser 1.74 (H) 0.61 - 1.24 mg/dL   Calcium 9.3 8.9 - 10.3 mg/dL   Total Protein 6.3 (L) 6.5 - 8.1 g/dL   Albumin 3.7 3.5 - 5.0 g/dL   AST 16 15 - 41 U/L   ALT 20 0 - 44 U/L   Alkaline Phosphatase 37 (L) 38 - 126 U/L   Total Bilirubin 0.7 0.3 - 1.2 mg/dL   GFR calc non Af Amer 39 (L) >60 mL/min   GFR calc Af Amer 46 (L) >60 mL/min   Anion gap 10 5 - 15  CBC WITH  DIFFERENTIAL     Status: Abnormal   Collection Time: 04/26/19  1:45 AM  Result Value Ref Range   WBC 11.4 (H) 4.0 - 10.5 K/uL   RBC 3.49 (L) 4.22 - 5.81 MIL/uL   Hemoglobin 10.3 (L) 13.0 - 17.0 g/dL   HCT 31.3 (L) 39.0 - 52.0 %   MCV 89.7 80.0 - 100.0 fL   MCH 29.5 26.0 - 34.0 pg   MCHC 32.9 30.0 - 36.0 g/dL   RDW 13.4 11.5 - 15.5 %   Platelets 189 150 - 400 K/uL   nRBC 0.0 0.0 - 0.2 %   Neutrophils Relative % 78 %   Neutro Abs 8.8 (H) 1.7 - 7.7 K/uL   Lymphocytes Relative 11 %   Lymphs Abs 1.3 0.7 - 4.0 K/uL   Monocytes Relative 11 %   Monocytes Absolute 1.2 (H) 0.1 - 1.0 K/uL   Eosinophils Relative  0 %   Eosinophils Absolute 0.0 0.0 - 0.5 K/uL   Basophils Relative 0 %   Basophils Absolute 0.0 0.0 - 0.1 K/uL   Immature Granulocytes 0 %   Abs Immature Granulocytes 0.02 0.00 - 0.07 K/uL  SARS Coronavirus 2 by RT PCR (hospital order, performed in [facility] hospital lab) Nasopharyngeal Nasopharyngeal Swab     Status: None   Collection Time: 04/26/19  1:55 AM   Specimen: Nasopharyngeal Swab  Result Value Ref Range   SARS Coronavirus 2 NEGATIVE NEGATIVE   Recent Results (from the past 240 hour(s))  Novel Coronavirus, NAA (Hosp order, Send-out to Ref Lab; TAT 18-24 hrs     Status: None   Collection Time: 04/20/19  9:02 AM   Specimen: Nasopharyngeal Swab; Respiratory  Result Value Ref Range Status   SARS-CoV-2, NAA NOT DETECTED NOT DETECTED Final    Comment: (NOTE) This nucleic acid amplification test was developed and its performance characteristics determined by [employer]. Nucleic acid amplification tests include PCR and TMA. This test has not been FDA cleared or approved. This test has been authorized by FDA under an Emergency Use Authorization (EUA). This test is only authorized for the duration of time the declaration that circumstances exist justifying the authorization of the emergency use of in vitro diagnostic tests for detection of SARS-CoV-2 virus  and/or diagnosis of COVID-19 infection under section 564(b)(1) of the Act, 21 U.S.C. [id number]) (1), unless the authorization is terminated or revoked sooner. When diagnostic testing is negative, the possibility of a false negative result should be considered in the context of a patient's recent exposures and the presence of clinical signs and symptoms consistent with COVID-19. An individual without symptoms of COVID- 19 and who is not shedding SARS-CoV-2 vi rus would expect to have a negative (not detected) result in this assay. Performed At: [facility] [address] [place], [place] [id number] Rush Farmer MD [id number]    [place]  Final    Comment: Performed at [facility], [place] [address] [place], [place] [address]  Urine culture     Status: None   Collection Time: 04/22/19 11:55 AM   Specimen: Urine, Clean Catch  Result Value Ref Range Status   Specimen Description   Final    URINE, CLEAN CATCH Performed at [facility], [place] [address], [place], [place] [address]    Special Requests   Final    NONE Performed at [facility], [place] [address], [place], [place] [address]    Culture   Final    NO GROWTH Performed at [facility], [place] [address], [place], [place] [address]    Report Status 04/23/2019 FINAL  Final  SARS Coronavirus 2 by RT PCR (hospital order, performed in [facility] hospital lab) Nasopharyngeal Nasopharyngeal Swab     Status: None   Collection Time: 04/26/19  1:55 AM   Specimen: Nasopharyngeal Swab  Result Value Ref Range Status   SARS Coronavirus 2 NEGATIVE NEGATIVE Final    Comment: (NOTE) If result is NEGATIVE SARS-CoV-2 target nucleic acids are NOT DETECTED. The SARS-CoV-2 RNA is generally detectable in upper and lower  respiratory specimens during the acute phase of infection. The lowest  concentration of SARS-CoV-2 viral copies this assay can detect is 250   copies / mL. A negative result does not preclude SARS-CoV-2 infection  and should not be used as the sole basis for treatment or other  patient management decisions.  A negative result may occur with  improper specimen collection / handling, submission of specimen other  than nasopharyngeal swab, presence of viral mutation(s) within the  areas targeted by this assay, and inadequate number of viral copies  (<250 copies / mL). A negative result must be combined with clinical  observations, patient history, and epidemiological information. If result is POSITIVE SARS-CoV-2 target nucleic acids are DETECTED. The SARS-CoV-2 RNA is generally detectable in upper and lower  respiratory specimens dur ing the acute phase of infection.  Positive  results are indicative of active infection with SARS-CoV-2.  Clinical  correlation with patient history and other diagnostic information is  necessary to determine patient infection status.  Positive results do  not rule out bacterial infection or co-infection with other viruses. If result is PRESUMPTIVE POSTIVE SARS-CoV-2 nucleic acids MAY BE PRESENT.   A presumptive positive result was obtained on the submitted specimen  and confirmed on repeat testing.  While 2019 novel coronavirus  (SARS-CoV-2) nucleic acids may be present in the submitted sample  additional confirmatory testing may be necessary for epidemiological  and / or clinical management purposes  to differentiate between  SARS-CoV-2 and other Sarbecovirus currently known to infect humans.  If clinically indicated additional testing with an alternate test  methodology [phone]) is advised. The SARS-CoV-2 RNA is generally  detectable in upper and lower respiratory sp ecimens during the acute  phase of infection. The expected result is Negative. Fact Sheet for Patients:  StrictlyIdeas.no Fact Sheet for Healthcare  Providers: BankingDealers.co.za This test is not yet approved or cleared by the [place] FDA and has been authorized for detection and/or diagnosis of SARS-CoV-2 by FDA under an Emergency Use Authorization (EUA).  This EUA will remain in effect (meaning this test can be used) for the duration of the COVID-19 declaration under Section 564(b)(1) of the Act, 21 U.S.C. section 360bbb-3(b)(1), unless the authorization is terminated or revoked sooner. Performed at [facility], [place] [address], [place], [place] [address]    Creatinine: Recent Labs    04/22/19 1158 04/24/19 1653 04/25/19 0518 04/26/19 0145  CREATININE 1.17 1.14 1.34* 1.74*   Baseline Creatinine: 1.2  Impression/Assessment:  Left pyelonephritis with acute kidney injury following left PCNL for partial staghorn calculi. Tachycardic but otherwise hemodynamically stable.  Nothing to suggest on-going bleeding.  Pre-operative cultures were negative.  Plan:  1. Admit for IV antibiotics. Urine and blood cultures pening  Nicolette Bang 04/26/2019, 3:49 AM

## 2019-04-26 NOTE — ED Provider Notes (Signed)
[facility] DEPT Provider Note   CSN: [id number] Arrival date & time: 04/25/19  2310     History   Chief Complaint Chief Complaint  Patient presents with   Post-op Problem    HPI Tony Douglas is a 68 y.o. male.     The history is provided by the patient.  Fever Max temp prior to arrival:  102 Temp source:  Oral Severity:  Moderate Onset quality:  Gradual Timing:  Constant Progression:  Partially resolved Chronicity:  New Relieved by:  Acetaminophen Worsened by:  Nothing Ineffective treatments:  Acetaminophen Associated symptoms: no chest pain, no chills, no confusion, no congestion, no cough, no diarrhea, no dysuria, no ear pain, no headaches, no myalgias, no nausea, no rash and no vomiting   Associated symptoms comment:  Post op and discharged earlier in the day.  Leakage of serosanguinous fluid, copious from left flank incision.  Soaked 3 sets of clothes per wife.   Risk factors: no hx of cancer     Past Medical History:  Diagnosis Date   Allergic rhinitis    Asthma    followed by dr wert as needed, per pt controlled   Benign localized prostatic hyperplasia with lower urinary tract symptoms (LUTS)    Chronic constipation    Diverticulosis of colon    GERD (gastroesophageal reflux disease)    History of carpal tunnel syndrome    Bilateral   History of kidney stones    Hyperlipidemia, mixed    Hypertension    MDD (major depressive disorder)    Nephrolithiasis    per CT 02-11-2019 bilateral non-obstructive   Osteoarthritis    PONV (postoperative nausea and vomiting)    Right ureteral stone    Wears contact lenses     Patient Active Problem List   Diagnosis Date Noted   Kidney stone 04/24/2019   Primary localized osteoarthritis of left knee 12/31/2018   Osteoarthritis of right hip 05/21/2014   Cough 11/14/2011   Hypertension 11/14/2011   Asthma, extrinsic 11/14/2011    Past Surgical History:    Procedure Laterality Date   APPENDECTOMY  1970s   CARPAL TUNNEL RELEASE Left 04/20/2017   Procedure: LEFT CARPAL TUNNEL RELEASE;  Surgeon: [name], MD;  Location: [place];  Service: Orthopedics;  Laterality: Left;   CARPAL TUNNEL RELEASE Right 05/04/2017   Procedure: CARPAL TUNNEL RELEASE;  Surgeon: [name], MD;  Location: [place];  Service: Orthopedics;  Laterality: Right;   COLONOSCOPY     CYSTOSCOPY  04/24/2019   Procedure: CYSTOSCOPY FLEXIBLE;  Surgeon: Ardis Hughs, MD;  Location: [facility];  Service: Urology;;   CYSTOSCOPY/URETEROSCOPY/HOLMIUM LASER/STENT PLACEMENT Right 03/06/2019   Procedure: RIGHT URETEROSCOPY/HOLMIUM LASER/STENT PLACEMENT STONE BASKET RETRIVAL;  Surgeon: Ardis Hughs, MD;  Location: [facility];  Service: Urology;  Laterality: Right;   HAMMER TOE SURGERY Right yrs ago   2nd toe   INGUINAL HERNIA REPAIR Right 09-17-2003  @[facility]    KNEE ARTHROSCOPY Right 2002   w/ post op arthroscopy debridement/ lavage 07-30-2000   KNEE ARTHROSCOPY WITH MEDIAL MENISECTOMY Left 01/14/2019   Procedure: KNEE ARTHROSCOPY WITH MEDIAL MENISECTOMY VERSES A POSSIBLE UNIT ARTHROPLASTY POSSIBLE A TOTAL ARTHROPLASTY;  Surgeon: Elsie Saas, MD;  Location: [facility];  Service: Orthopedics;  Laterality: Left;   LAPAROSCOPIC SIGMOID COLECTOMY  02-12-2003   @[facility]    diverticulosis   NEPHROLITHOTOMY Left 04/24/2019   Procedure: NEPHROLITHOTOMY PERCUTANEOUS WITH SURGEON ACCESS;  Surgeon: Ardis Hughs,  MD;  Location: [facility];  Service: Urology;  Laterality: Left;   SHOULDER ARTHROSCOPY DISTAL CLAVICLE EXCISION AND OPEN ROTATOR CUFF REPAIR Right 01-17-2008   dr Percell Miller   TOTAL HIP ARTHROPLASTY Right 05/21/2014   Procedure: RIGHT TOTAL HIP ARTHROPLASTY;  Surgeon: [name], MD;  Location: [place];  Service: Orthopedics;  Laterality: Right;   TOTAL KNEE ARTHROPLASTY Right 12-16-2009   @[facility]    TOTAL KNEE  ARTHROPLASTY Left 01/14/2019   Procedure: TOTAL KNEE ARTHROPLASTY;  Surgeon: Elsie Saas, MD;  Location: [facility];  Service: Orthopedics;  Laterality: Left;        Home Medications    Prior to Admission medications   Medication Sig Start Date End Date Taking? Authorizing Provider  acetaminophen (TYLENOL) 650 MG CR tablet Take 1,300 mg by mouth every 8 (eight) hours as needed for pain.   Yes [provider]  budesonide-formoterol (SYMBICORT) 160-4.5 MCG/ACT inhaler Take 2 puffs first thing in am and then another 2 puffs about 12 hours later. Patient taking differently: Inhale 2 puffs into the lungs 2 (two) times a day.  10/17/13  Yes Tanda Rockers, MD  buPROPion [facility] SR) 150 MG 12 hr tablet Take 150 mg by mouth 2 (two) times daily.   Yes [provider]  Cholecalciferol (VITAMIN D) 125 MCG (5000 UT) CAPS Take 5,000 Units by mouth daily.    Yes [provider]  doxazosin (CARDURA) 4 MG tablet Take 4 mg by mouth at bedtime.   Yes [provider]  dutasteride (AVODART) 0.5 MG capsule Take 0.5 mg by mouth at bedtime.    Yes [provider]  EPIPEN 2-PAK 0.3 MG/0.3ML SOAJ injection Inject 0.3 mg into the muscle as needed for anaphylaxis.    Yes [provider]  fluticasone (FLONASE) 50 MCG/ACT nasal spray Place 1 spray into both nostrils 2 (two) times daily.    Yes [provider]  montelukast (SINGULAIR) 10 MG tablet Take 1 tablet (10 mg total) by mouth at bedtime. 02/11/13 04/26/19 Yes Tanda Rockers, MD  olmesartan-hydrochlorothiazide (BENICAR HCT) 20-12.5 MG tablet Take 0.5 tablets by mouth daily.   Yes [provider]  omeprazole (PRILOSEC) 20 MG capsule Take 20 mg by mouth daily.    Yes Tanda Rockers, MD  ondansetron (ZOFRAN) 4 MG tablet Take 1 tablet (4 mg total) by mouth every 6 (six) hours. Patient taking differently: Take 4 mg by mouth every 8 (eight) hours as needed for nausea or vomiting.  02/11/19  Yes  Virgel Manifold, MD  polyethylene glycol (MIRALAX / GLYCOLAX) 17 g packet 17grams in 6 oz of something to drink twice a day until bowel movement.  LAXITIVE.  Restart if two days since last bowel movement Patient taking differently: Take 17 g by mouth every 3 (three) days.  01/15/19  Yes Shepperson, Kirstin, PA-C  simvastatin (ZOCOR) 40 MG tablet Take 40 mg by mouth daily.    Yes [provider]  traMADol (ULTRAM) 50 MG tablet Take 1-2 tablets (50-100 mg total) by mouth every 6 (six) hours as needed for moderate pain. 04/25/19  Yes Ardis Hughs, MD  gabapentin (NEURONTIN) 300 MG capsule 1 tablet before bed for nerve pain Patient not taking: Reported on 04/17/2019 01/15/19   Matthew Saras, PA-C    Family History Family History  Problem Relation Age of Onset   Emphysema Father        smoked and worked in [place]  Heart disease Mother     Social History Social History   Tobacco Use   Smoking status: Former Smoker    Packs/day: 1.00    Years: 30.00    Pack years: 30.00    Types: Cigarettes    Quit date: 07/04/1992    Years since quitting: 26.8   Smokeless tobacco: Never Used  Substance Use Topics   Alcohol use: Not Currently    Comment: 1 or 2 beers per month   Drug use: No     Allergies   Bee venom   Review of Systems Review of Systems  Constitutional: Positive for fever. Negative for chills.  HENT: Negative for congestion and ear pain.   Eyes: Negative for visual disturbance.  Respiratory: Negative for cough and shortness of breath.   Cardiovascular: Negative for chest pain.  Gastrointestinal: Negative for diarrhea, nausea and vomiting.  Genitourinary: Positive for flank pain. Negative for dysuria.  Musculoskeletal: Negative for myalgias.  Skin: Negative for color change and rash.  Neurological: Negative for headaches.  Hematological: Negative for adenopathy.  Psychiatric/Behavioral: Negative for  confusion.  All other systems reviewed and are negative.    Physical Exam Updated Vital Signs BP 131/81 (BP Location: Left Arm)    Pulse (!) 113    Temp 98.9 F (37.2 C) (Oral)    Resp 20    Ht 5\' 10"  (1.778 m)    Wt 99.8 kg    SpO2 93%    BMI 31.57 kg/m   Physical Exam Vitals signs and nursing note reviewed.  Constitutional:      General: He is not in acute distress.    Appearance: He is normal weight.  HENT:     Head: Normocephalic and atraumatic.     Nose: Nose normal.  Eyes:     Conjunctiva/sclera: Conjunctivae normal.     Pupils: Pupils are equal, round, and reactive to light.  Neck:     Musculoskeletal: Normal range of motion and neck supple.  Cardiovascular:     Rate and Rhythm: Normal rate and regular rhythm.     Pulses: Normal pulses.     Heart sounds: Normal heart sounds.  Pulmonary:     Effort: Pulmonary effort is normal.     Breath sounds: Normal breath sounds.  Abdominal:     General: Abdomen is flat. Bowel sounds are normal.     Tenderness: There is no abdominal tenderness. There is no guarding or rebound.  Musculoskeletal: Normal range of motion.  Skin:    General: Skin is warm.     Capillary Refill: Capillary refill takes less than 2 seconds.       Neurological:     Mental Status: He is alert and oriented to person, place, and time.  Psychiatric:        Mood and Affect: Mood normal.        Behavior: Behavior normal.      ED Treatments / Results  Labs (all labs ordered are listed, but only abnormal results are displayed) Results for orders placed or performed during the hospital encounter of 04/26/19  SARS Coronavirus 2 by RT PCR (hospital order, performed in [facility] hospital lab) Nasopharyngeal Nasopharyngeal Swab   Specimen: Nasopharyngeal Swab  Result Value Ref Range   SARS Coronavirus 2 NEGATIVE NEGATIVE  Urinalysis, Routine w reflex microscopic  Result Value Ref Range   Color, Urine YELLOW YELLOW   APPearance HAZY (A) CLEAR    Specific Gravity, Urine 1.010 1.005 - 1.030   pH  5.0 5.0 - 8.0   Glucose, UA NEGATIVE NEGATIVE mg/dL   Hgb urine dipstick LARGE (A) NEGATIVE   Bilirubin Urine NEGATIVE NEGATIVE   Ketones, ur NEGATIVE NEGATIVE mg/dL   Protein, ur 100 (A) NEGATIVE mg/dL   Nitrite NEGATIVE NEGATIVE   Leukocytes,Ua TRACE (A) NEGATIVE   RBC / HPF >50 (H) 0 - 5 RBC/hpf   WBC, UA 21-50 0 - 5 WBC/hpf   Bacteria, UA RARE (A) NONE SEEN   Mucus PRESENT   Lactic acid, plasma  Result Value Ref Range   Lactic Acid, Venous 1.3 0.5 - 1.9 mmol/L  Comprehensive metabolic panel  Result Value Ref Range   Sodium 134 (L) 135 - 145 mmol/L   Potassium 3.7 3.5 - 5.1 mmol/L   Chloride 102 98 - 111 mmol/L   CO2 22 22 - 32 mmol/L   Glucose, Bld 122 (H) 70 - 99 mg/dL   BUN 19 8 - 23 mg/dL   Creatinine, Ser 1.74 (H) 0.61 - 1.24 mg/dL   Calcium 9.3 8.9 - 10.3 mg/dL   Total Protein 6.3 (L) 6.5 - 8.1 g/dL   Albumin 3.7 3.5 - 5.0 g/dL   AST 16 15 - 41 U/L   ALT 20 0 - 44 U/L   Alkaline Phosphatase 37 (L) 38 - 126 U/L   Total Bilirubin 0.7 0.3 - 1.2 mg/dL   GFR calc non Af Amer 39 (L) >60 mL/min   GFR calc Af Amer 46 (L) >60 mL/min   Anion gap 10 5 - 15  CBC WITH DIFFERENTIAL  Result Value Ref Range   WBC 11.4 (H) 4.0 - 10.5 K/uL   RBC 3.49 (L) 4.22 - 5.81 MIL/uL   Hemoglobin 10.3 (L) 13.0 - 17.0 g/dL   HCT 31.3 (L) 39.0 - 52.0 %   MCV 89.7 80.0 - 100.0 fL   MCH 29.5 26.0 - 34.0 pg   MCHC 32.9 30.0 - 36.0 g/dL   RDW 13.4 11.5 - 15.5 %   Platelets 189 150 - 400 K/uL   nRBC 0.0 0.0 - 0.2 %   Neutrophils Relative % 78 %   Neutro Abs 8.8 (H) 1.7 - 7.7 K/uL   Lymphocytes Relative 11 %   Lymphs Abs 1.3 0.7 - 4.0 K/uL   Monocytes Relative 11 %   Monocytes Absolute 1.2 (H) 0.1 - 1.0 K/uL   Eosinophils Relative 0 %   Eosinophils Absolute 0.0 0.0 - 0.5 K/uL   Basophils Relative 0 %   Basophils Absolute 0.0 0.0 - 0.1 K/uL   Immature Granulocytes 0 %   Abs Immature Granulocytes 0.02 0.00 - 0.07 K/uL   Dg Chest 2  View  Result Date: 04/26/2019 CLINICAL DATA:  Fever EXAM: CHEST - 2 VIEW COMPARISON:  May 14, 2012 FINDINGS: The heart size and mediastinal contours are within normal limits. There is mild hazy airspace opacity at the right lung base. Streaky opacity seen within the left mid lung. No acute osseous abnormality. There is mildly dilated air-filled loop of small bowel in the left upper abdomen measuring 4.3 cm. IMPRESSION: 1. Nonspecific hazy airspace opacity at the right lung base and streaky opacity in the left mid lung. This could be due to atelectasis and/or early infectious etiology. 2. Mildly dilated air-filled loop of bowel in the left upper abdomen. Electronically Signed   By: Prudencio Pair M.D.   On: 04/26/2019 00:44   Dg C-arm 1-60 Min-no Report  Result Date: 04/24/2019 Fluoroscopy was utilized by the requesting physician.  No radiographic interpretation.   Ct Renal Stone Study  Result Date: 04/26/2019 CLINICAL DATA:  Back pain fever abdominal pain, head percutaneous nephrolithotomy and stent placement with drain removal EXAM: CT ABDOMEN AND PELVIS WITHOUT CONTRAST TECHNIQUE: Multidetector CT imaging of the abdomen and pelvis was performed following the standard protocol without IV contrast. COMPARISON:  February 11, 2019 FINDINGS: Lower chest: The visualized heart size within normal limits. No pericardial fluid/thickening. No hiatal hernia. Streaky atelectasis or scarring at both lung bases. Hepatobiliary: Although limited due to the lack of intravenous contrast, normal in appearance without gross focal abnormality. No evidence of calcified gallstones or biliary ductal dilatation. Pancreas:  Unremarkable.  No surrounding inflammatory changes. Spleen: Normal in size. Although limited due to the lack of intravenous contrast, normal in appearance. Adrenals/Urinary Tract: Both adrenal glands appear normal. There is significant amount of left-sided perinephric stranding with fluid and hyperdense  blood products seen extending in the retroperitoneum and along the psoas. There is free air is seen surrounding the left kidney and ureteral drain. There is a double-J left-sided ureteral stent with the tip coiled in the bladder. No hydronephrosis is seen. There is also mild right perinephric standing with a punctate calcification in the upper pole of the right kidney. No right-sided hydronephrosis. Stomach/Bowel: The stomach, small bowel, and colon are normal in appearance. No inflammatory changes or obstructive findings. appendix is normal. Vascular/Lymphatic: There are no enlarged abdominal or pelvic lymph nodes. Scattered aortic atherosclerotic calcifications are seen without aneurysmal dilatation. Reproductive: The prostate is unremarkable. Other: Small fat containing bilateral inguinal hernias are seen. Small amount of fluid seen within the left inguinal hernia. There are fat stranding changes seen along the left anterior subcutaneous soft tissues. No loculated fluid collection however is noted. Musculoskeletal: No acute or significant osseous findings. IMPRESSION: 1. Significant left perinephric stranding with fluid and blood products extending in the retroperitoneum ,anterior to the psoas musculature into the pelvis. This could be due to changes from posttraumatic recent drain removal, however cannot exclude active bleed. 2. There is also a small amount of pneumoperitoneum seen surrounding the right kidney and proximal ureter. 3. Double-J left ureteral stent with the tip coiled in the posterior bladder. 4. Mild amount of stranding changes along the posterior left subcutaneous tissues. No loculated fluid collection. Electronically Signed   By: Prudencio Pair M.D.   On: 04/26/2019 03:19    EKG EKG Interpretation  Date/Time:  Friday April 26 2019 02:01:14 EDT Ventricular Rate:  110 PR Interval:    QRS Duration: 102 QT Interval:  337 QTC Calculation: 456 R Axis:   74 Text Interpretation:  Sinus  tachycardia Confirmed by Dory Horn) on 04/26/2019 2:27:37 AM   Radiology Dg Chest 2 View  Result Date: 04/26/2019 CLINICAL DATA:  Fever EXAM: CHEST - 2 VIEW COMPARISON:  May 14, 2012 FINDINGS: The heart size and mediastinal contours are within normal limits. There is mild hazy airspace opacity at the right lung base. Streaky opacity seen within the left mid lung. No acute osseous abnormality. There is mildly dilated air-filled loop of small bowel in the left upper abdomen measuring 4.3 cm. IMPRESSION: 1. Nonspecific hazy airspace opacity at the right lung base and streaky opacity in the left mid lung. This could be due to atelectasis and/or early infectious etiology. 2. Mildly dilated air-filled loop of bowel in the left upper abdomen. Electronically Signed   By: Prudencio Pair M.D.   On: 04/26/2019 00:44   Dg C-arm 1-60 Min-no Report  Result Date: 04/24/2019 Fluoroscopy was utilized by the requesting physician.  No radiographic interpretation.   Ct Renal Stone Study  Result Date: 04/26/2019 CLINICAL DATA:  Back pain fever abdominal pain, head percutaneous nephrolithotomy and stent placement with drain removal EXAM: CT ABDOMEN AND PELVIS WITHOUT CONTRAST TECHNIQUE: Multidetector CT imaging of the abdomen and pelvis was performed following the standard protocol without IV contrast. COMPARISON:  February 11, 2019 FINDINGS: Lower chest: The visualized heart size within normal limits. No pericardial fluid/thickening. No hiatal hernia. Streaky atelectasis or scarring at both lung bases. Hepatobiliary: Although limited due to the lack of intravenous contrast, normal in appearance without gross focal abnormality. No evidence of calcified gallstones or biliary ductal dilatation. Pancreas:  Unremarkable.  No surrounding inflammatory changes. Spleen: Normal in size. Although limited due to the lack of intravenous contrast, normal in appearance. Adrenals/Urinary Tract: Both adrenal glands appear  normal. There is significant amount of left-sided perinephric stranding with fluid and hyperdense blood products seen extending in the retroperitoneum and along the psoas. There is free air is seen surrounding the left kidney and ureteral drain. There is a double-J left-sided ureteral stent with the tip coiled in the bladder. No hydronephrosis is seen. There is also mild right perinephric standing with a punctate calcification in the upper pole of the right kidney. No right-sided hydronephrosis. Stomach/Bowel: The stomach, small bowel, and colon are normal in appearance. No inflammatory changes or obstructive findings. appendix is normal. Vascular/Lymphatic: There are no enlarged abdominal or pelvic lymph nodes. Scattered aortic atherosclerotic calcifications are seen without aneurysmal dilatation. Reproductive: The prostate is unremarkable. Other: Small fat containing bilateral inguinal hernias are seen. Small amount of fluid seen within the left inguinal hernia. There are fat stranding changes seen along the left anterior subcutaneous soft tissues. No loculated fluid collection however is noted. Musculoskeletal: No acute or significant osseous findings. IMPRESSION: 1. Significant left perinephric stranding with fluid and blood products extending in the retroperitoneum ,anterior to the psoas musculature into the pelvis. This could be due to changes from posttraumatic recent drain removal, however cannot exclude active bleed. 2. There is also a small amount of pneumoperitoneum seen surrounding the right kidney and proximal ureter. 3. Double-J left ureteral stent with the tip coiled in the posterior bladder. 4. Mild amount of stranding changes along the posterior left subcutaneous tissues. No loculated fluid collection. Electronically Signed   By: Prudencio Pair M.D.   On: 04/26/2019 03:19    Procedures Procedures (including critical care time)  Medications Ordered in ED Medications  vancomycin (VANCOCIN) 2,000  mg in sodium chloride 0.9 % 500 mL IVPB (2,000 mg Intravenous New Bag/Given 04/26/19 0246)  fentaNYL (SUBLIMAZE) injection 100 mcg (100 mcg Intravenous Given 04/26/19 0208)  ceFEPIme (MAXIPIME) 2 g in sodium chloride 0.9 % 100 mL IVPB (0 g Intravenous Stopped 04/26/19 0243)  metroNIDAZOLE (FLAGYL) IVPB 500 mg (0 mg Intravenous Stopped 04/26/19 0327)  sodium chloride 0.9 % bolus 1,000 mL (1,000 mLs Intravenous Bolus from Bag 04/26/19 0328)     MDM Reviewed: previous chart, nursing note and vitals Reviewed previous: labs Interpretation: labs, ECG, x-ray and CT scan (slight elevation in white count and creatinine, NO CHF on CXR by me ) Total time providing critical care: 30-74 minutes. This excludes time spent performing separately reportable procedures and services. Consults: urology  CRITICAL CARE Performed by: Saleen Peden K Adalae Baysinger-Rasch Total critical care time: 60 minutes Critical care time was exclusive of separately billable procedures and treating other patients. Critical care was necessary to  treat or prevent imminent or life-threatening deterioration. Critical care was time spent personally by me on the following activities: development of treatment plan with patient and/or surrogate as well as nursing, discussions with consultants, evaluation of patient's response to treatment, examination of patient, obtaining history from patient or surrogate, ordering and performing treatments and interventions, ordering and review of laboratory studies, ordering and review of radiographic studies, pulse oximetry and re-evaluation of patient's condition.  Final Clinical Impressions(s) / ED Diagnoses   Final diagnoses:  Post-operative pain  HCAP (healthcare-associated pneumonia)  Febrile illness   Case d/w Dr. Alyson Ingles who will admit the patient.  Full cultures drawn    Fortuna, Stavros Cail, MD 04/26/19 [phone]

## 2019-04-26 NOTE — ED Notes (Signed)
Called consult to urology@3 :30am.

## 2019-04-26 NOTE — ED Notes (Signed)
ED TO INPATIENT HANDOFF REPORT  ED Nurse Name and Phone #:  Leafy Kindle  [id number] S Name/Age/Gender Tony Douglas 68 y.o. male Room/Bed: WA04/WA04  Code Status   Code Status: Full Code  Home/SNF/Other Home Patient oriented to: self, place, time and situation Is this baseline? Yes   Triage Complete: Triage complete  Chief Complaint Fever (101.8); Post Op Pain  Triage Note Patient had surgery on Wednesday 04/24/2019 for RIGHT URETEROSCOPY/HOLMIUM LASER/STENT PLACEMENT STONE BASKET RETRIVAL. Patient states he developed a fever 102.1 at home but took tylenol. Patient is having a lot of drainage on lower right back. Patient states pain has increased when he stands up. .    Allergies Allergies  Allergen Reactions  . Bee Venom Anaphylaxis    Level of Care/Admitting Diagnosis ED Disposition    ED Disposition Condition Comment   Admit  Hospital Area: [place] [id number]  Level of Care: Med-Surg [16]  Covid Evaluation: Confirmed COVID Negative  Diagnosis: Fever [id number]  Admitting Physician: Cleon Gustin [id number]  Attending Physician: Cleon Gustin [id number]  Bed request comments: 4th floor  PT Class (Do Not Modify): Observation [104]  PT Acc Code (Do Not Modify): Observation [10022]       B Medical/Surgery History Past Medical History:  Diagnosis Date  . Allergic rhinitis   . Asthma    followed by dr wert as needed, per pt controlled  . Benign localized prostatic hyperplasia with lower urinary tract symptoms (LUTS)   . Chronic constipation   . Diverticulosis of colon   . GERD (gastroesophageal reflux disease)   . History of carpal tunnel syndrome    Bilateral  . History of kidney stones   . Hyperlipidemia, mixed   . Hypertension   . MDD (major depressive disorder)   . Nephrolithiasis    per CT 02-11-2019 bilateral non-obstructive  . Osteoarthritis   . PONV (postoperative nausea and vomiting)   . Right ureteral stone    . Wears contact lenses    Past Surgical History:  Procedure Laterality Date  . APPENDECTOMY  1970s  . CARPAL TUNNEL RELEASE Left 04/20/2017   Procedure: LEFT CARPAL TUNNEL RELEASE;  Surgeon: [name], MD;  Location: [place];  Service: Orthopedics;  Laterality: Left;  . CARPAL TUNNEL RELEASE Right 05/04/2017   Procedure: CARPAL TUNNEL RELEASE;  Surgeon: [name], MD;  Location: [place];  Service: Orthopedics;  Laterality: Right;  . COLONOSCOPY    . CYSTOSCOPY  04/24/2019   Procedure: CYSTOSCOPY FLEXIBLE;  Surgeon: Ardis Hughs, MD;  Location: [facility];  Service: Urology;;  . CYSTOSCOPY/URETEROSCOPY/HOLMIUM LASER/STENT PLACEMENT Right 03/06/2019   Procedure: RIGHT URETEROSCOPY/HOLMIUM LASER/STENT PLACEMENT STONE BASKET RETRIVAL;  Surgeon: Ardis Hughs, MD;  Location: [facility];  Service: Urology;  Laterality: Right;  . HAMMER TOE SURGERY Right yrs ago   2nd toe  . INGUINAL HERNIA REPAIR Right 09-17-2003  @[facility]   . KNEE ARTHROSCOPY Right 2002   w/ post op arthroscopy debridement/ lavage 07-30-2000  . KNEE ARTHROSCOPY WITH MEDIAL MENISECTOMY Left 01/14/2019   Procedure: KNEE ARTHROSCOPY WITH MEDIAL MENISECTOMY VERSES A POSSIBLE UNIT ARTHROPLASTY POSSIBLE A TOTAL ARTHROPLASTY;  Surgeon: Elsie Saas, MD;  Location: [facility];  Service: Orthopedics;  Laterality: Left;  . LAPAROSCOPIC SIGMOID COLECTOMY  02-12-2003   @[facility]    diverticulosis  . NEPHROLITHOTOMY Left 04/24/2019   Procedure: NEPHROLITHOTOMY PERCUTANEOUS WITH SURGEON ACCESS;  Surgeon: Ardis Hughs, MD;  Location: [facility];  Service: Urology;  Laterality: Left;  . SHOULDER ARTHROSCOPY DISTAL CLAVICLE EXCISION AND OPEN ROTATOR CUFF REPAIR Right 01-17-2008   dr Percell Miller  . TOTAL HIP ARTHROPLASTY Right 05/21/2014   Procedure: RIGHT TOTAL HIP ARTHROPLASTY;  Surgeon: [name], MD;  Location: [place];  Service: Orthopedics;  Laterality: Right;  . TOTAL KNEE  ARTHROPLASTY Right 12-16-2009   @[facility]   . TOTAL KNEE ARTHROPLASTY Left 01/14/2019   Procedure: TOTAL KNEE ARTHROPLASTY;  Surgeon: Elsie Saas, MD;  Location: [facility];  Service: Orthopedics;  Laterality: Left;     A IV Location/Drains/Wounds Patient Lines/Drains/Airways Status   Active Line/Drains/Airways    Name:   Placement date:   Placement time:   Site:   Days:   Peripheral IV 04/26/19 Left Antecubital   04/26/19    0149    Antecubital   less than 1   Peripheral IV 04/26/19 Right Antecubital   04/26/19    0215    Antecubital   less than 1   Ureteral Drain/Stent Right ureter 6 Fr.   03/06/19    0856    Right ureter   51   Ureteral Drain/Stent  6 Fr.   04/24/19    1409    -   2   Incision (Closed) 05/21/14 Hip Right   05/21/14    0947     1801   Incision (Closed) 04/20/17 Wrist Left   04/20/17    1216     736   Incision (Closed) 05/04/17 Hand Right   05/04/17    0821     722   Incision (Closed) 01/14/19 Knee Left   01/14/19    1204     102   Incision (Closed) 03/06/19 Penis   03/06/19    0900     51   Incision (Closed) 04/24/19 Back Left   04/24/19    1450     2          Intake/Output Last 24 hours  Intake/Output Summary (Last 24 hours) at 04/26/2019 0401 Last data filed at 04/26/2019 0327 Gross per 24 hour  Intake 200 ml  Output -  Net 200 ml    Labs/Imaging Results for orders placed or performed during the hospital encounter of 04/26/19 (from the past 48 hour(s))  Urinalysis, Routine w reflex microscopic     Status: Abnormal   Collection Time: 04/26/19  1:30 AM  Result Value Ref Range   Color, Urine YELLOW YELLOW   APPearance HAZY (A) CLEAR   Specific Gravity, Urine 1.010 1.005 - 1.030   pH 5.0 5.0 - 8.0   Glucose, UA NEGATIVE NEGATIVE mg/dL   Hgb urine dipstick LARGE (A) NEGATIVE   Bilirubin Urine NEGATIVE NEGATIVE   Ketones, ur NEGATIVE NEGATIVE mg/dL   Protein, ur 100 (A) NEGATIVE mg/dL   Nitrite NEGATIVE NEGATIVE   Leukocytes,Ua TRACE (A) NEGATIVE   RBC / HPF  >50 (H) 0 - 5 RBC/hpf   WBC, UA 21-50 0 - 5 WBC/hpf   Bacteria, UA RARE (A) NONE SEEN   Mucus PRESENT     Comment: Performed at [facility], [place] [address], [place], [place] [address]  Lactic acid, plasma     Status: None   Collection Time: 04/26/19  1:45 AM  Result Value Ref Range   Lactic Acid, Venous 1.3 0.5 - 1.9 mmol/L    Comment: Performed at [facility], [place] [address], [place], [place] [address]  Comprehensive metabolic panel     Status: Abnormal  Collection Time: 04/26/19  1:45 AM  Result Value Ref Range   Sodium 134 (L) 135 - 145 mmol/L   Potassium 3.7 3.5 - 5.1 mmol/L   Chloride 102 98 - 111 mmol/L   CO2 22 22 - 32 mmol/L   Glucose, Bld 122 (H) 70 - 99 mg/dL   BUN 19 8 - 23 mg/dL   Creatinine, Ser 1.74 (H) 0.61 - 1.24 mg/dL   Calcium 9.3 8.9 - 10.3 mg/dL   Total Protein 6.3 (L) 6.5 - 8.1 g/dL   Albumin 3.7 3.5 - 5.0 g/dL   AST 16 15 - 41 U/L   ALT 20 0 - 44 U/L   Alkaline Phosphatase 37 (L) 38 - 126 U/L   Total Bilirubin 0.7 0.3 - 1.2 mg/dL   GFR calc non Af Amer 39 (L) >60 mL/min   GFR calc Af Amer 46 (L) >60 mL/min   Anion gap 10 5 - 15    Comment: Performed at [facility], [place] [address], [place], [place] [address]  CBC WITH DIFFERENTIAL     Status: Abnormal   Collection Time: 04/26/19  1:45 AM  Result Value Ref Range   WBC 11.4 (H) 4.0 - 10.5 K/uL   RBC 3.49 (L) 4.22 - 5.81 MIL/uL   Hemoglobin 10.3 (L) 13.0 - 17.0 g/dL   HCT 31.3 (L) 39.0 - 52.0 %   MCV 89.7 80.0 - 100.0 fL   MCH 29.5 26.0 - 34.0 pg   MCHC 32.9 30.0 - 36.0 g/dL   RDW 13.4 11.5 - 15.5 %   Platelets 189 150 - 400 K/uL   nRBC 0.0 0.0 - 0.2 %   Neutrophils Relative % 78 %   Neutro Abs 8.8 (H) 1.7 - 7.7 K/uL   Lymphocytes Relative 11 %   Lymphs Abs 1.3 0.7 - 4.0 K/uL   Monocytes Relative 11 %   Monocytes Absolute 1.2 (H) 0.1 - 1.0 K/uL   Eosinophils Relative 0 %   Eosinophils Absolute 0.0 0.0 - 0.5 K/uL   Basophils Relative 0 %    Basophils Absolute 0.0 0.0 - 0.1 K/uL   Immature Granulocytes 0 %   Abs Immature Granulocytes 0.02 0.00 - 0.07 K/uL    Comment: Performed at [facility], [place] [address], [place], [place] [address]  APTT     Status: None   Collection Time: 04/26/19  1:45 AM  Result Value Ref Range   aPTT 31 24 - 36 seconds    Comment: Performed at [facility], [place] [address] [place], [place] [address]  Protime-INR     Status: None   Collection Time: 04/26/19  1:45 AM  Result Value Ref Range   Prothrombin Time 14.0 11.4 - 15.2 seconds   INR 1.1 0.8 - 1.2    Comment: (NOTE) INR goal varies based on device and disease states. Performed at [facility], [place] [address] [place], [place] [address]   SARS Coronavirus 2 by RT PCR (hospital order, performed in [facility] hospital lab) Nasopharyngeal Nasopharyngeal Swab     Status: None   Collection Time: 04/26/19  1:55 AM   Specimen: Nasopharyngeal Swab  Result Value Ref Range   SARS Coronavirus 2 NEGATIVE NEGATIVE    Comment: (NOTE) If result is NEGATIVE SARS-CoV-2 target nucleic acids are NOT DETECTED. The SARS-CoV-2 RNA is generally detectable in upper and lower  respiratory specimens during the acute phase of infection. The lowest  concentration of SARS-CoV-2 viral  copies this assay can detect is 250  copies / mL. A negative result does not preclude SARS-CoV-2 infection  and should not be used as the sole basis for treatment or other  patient management decisions.  A negative result may occur with  improper specimen collection / handling, submission of specimen other  than nasopharyngeal swab, presence of viral mutation(s) within the  areas targeted by this assay, and inadequate number of viral copies  (<250 copies / mL). A negative result must be combined with clinical  observations, patient history, and epidemiological information. If result is POSITIVE SARS-CoV-2 target nucleic acids  are DETECTED. The SARS-CoV-2 RNA is generally detectable in upper and lower  respiratory specimens dur ing the acute phase of infection.  Positive  results are indicative of active infection with SARS-CoV-2.  Clinical  correlation with patient history and other diagnostic information is  necessary to determine patient infection status.  Positive results do  not rule out bacterial infection or co-infection with other viruses. If result is PRESUMPTIVE POSTIVE SARS-CoV-2 nucleic acids MAY BE PRESENT.   A presumptive positive result was obtained on the submitted specimen  and confirmed on repeat testing.  While 2019 novel coronavirus  (SARS-CoV-2) nucleic acids may be present in the submitted sample  additional confirmatory testing may be necessary for epidemiological  and / or clinical management purposes  to differentiate between  SARS-CoV-2 and other Sarbecovirus currently known to infect humans.  If clinically indicated additional testing with an alternate test  methodology [phone]) is advised. The SARS-CoV-2 RNA is generally  detectable in upper and lower respiratory sp ecimens during the acute  phase of infection. The expected result is Negative. Fact Sheet for Patients:  StrictlyIdeas.no Fact Sheet for Healthcare Providers: BankingDealers.co.za This test is not yet approved or cleared by the [place] FDA and has been authorized for detection and/or diagnosis of SARS-CoV-2 by FDA under an Emergency Use Authorization (EUA).  This EUA will remain in effect (meaning this test can be used) for the duration of the COVID-19 declaration under Section 564(b)(1) of the Act, 21 U.S.C. section 360bbb-3(b)(1), unless the authorization is terminated or revoked sooner. Performed at [facility], [place] [address], [place], [place] [address]    Dg Chest 2 View  Result Date: 04/26/2019 CLINICAL DATA:  Fever EXAM: CHEST - 2  VIEW COMPARISON:  May 14, 2012 FINDINGS: The heart size and mediastinal contours are within normal limits. There is mild hazy airspace opacity at the right lung base. Streaky opacity seen within the left mid lung. No acute osseous abnormality. There is mildly dilated air-filled loop of small bowel in the left upper abdomen measuring 4.3 cm. IMPRESSION: 1. Nonspecific hazy airspace opacity at the right lung base and streaky opacity in the left mid lung. This could be due to atelectasis and/or early infectious etiology. 2. Mildly dilated air-filled loop of bowel in the left upper abdomen. Electronically Signed   By: Prudencio Pair M.D.   On: 04/26/2019 00:44   Dg C-arm 1-60 Min-no Report  Result Date: 04/24/2019 Fluoroscopy was utilized by the requesting physician.  No radiographic interpretation.   Ct Renal Stone Study  Result Date: 04/26/2019 CLINICAL DATA:  Back pain fever abdominal pain, head percutaneous nephrolithotomy and stent placement with drain removal EXAM: CT ABDOMEN AND PELVIS WITHOUT CONTRAST TECHNIQUE: Multidetector CT imaging of the abdomen and pelvis was performed following the standard protocol without IV contrast. COMPARISON:  February 11, 2019 FINDINGS: Lower chest: The visualized heart size  within normal limits. No pericardial fluid/thickening. No hiatal hernia. Streaky atelectasis or scarring at both lung bases. Hepatobiliary: Although limited due to the lack of intravenous contrast, normal in appearance without gross focal abnormality. No evidence of calcified gallstones or biliary ductal dilatation. Pancreas:  Unremarkable.  No surrounding inflammatory changes. Spleen: Normal in size. Although limited due to the lack of intravenous contrast, normal in appearance. Adrenals/Urinary Tract: Both adrenal glands appear normal. There is significant amount of left-sided perinephric stranding with fluid and hyperdense blood products seen extending in the retroperitoneum and along the psoas.  There is free air is seen surrounding the left kidney and ureteral drain. There is a double-J left-sided ureteral stent with the tip coiled in the bladder. No hydronephrosis is seen. There is also mild right perinephric standing with a punctate calcification in the upper pole of the right kidney. No right-sided hydronephrosis. Stomach/Bowel: The stomach, small bowel, and colon are normal in appearance. No inflammatory changes or obstructive findings. appendix is normal. Vascular/Lymphatic: There are no enlarged abdominal or pelvic lymph nodes. Scattered aortic atherosclerotic calcifications are seen without aneurysmal dilatation. Reproductive: The prostate is unremarkable. Other: Small fat containing bilateral inguinal hernias are seen. Small amount of fluid seen within the left inguinal hernia. There are fat stranding changes seen along the left anterior subcutaneous soft tissues. No loculated fluid collection however is noted. Musculoskeletal: No acute or significant osseous findings. IMPRESSION: 1. Significant left perinephric stranding with fluid and blood products extending in the retroperitoneum ,anterior to the psoas musculature into the pelvis. This could be due to changes from posttraumatic recent drain removal, however cannot exclude active bleed. 2. There is also a small amount of pneumoperitoneum seen surrounding the right kidney and proximal ureter. 3. Double-J left ureteral stent with the tip coiled in the posterior bladder. 4. Mild amount of stranding changes along the posterior left subcutaneous tissues. No loculated fluid collection. Electronically Signed   By: Prudencio Pair M.D.   On: 04/26/2019 03:19    Pending Labs Unresulted Labs (From admission, onward)    Start     Ordered   04/26/19 0342  HIV Antibody (routine testing w rflx)  (HIV Antibody (Routine testing w reflex) panel)  Once,   STAT     04/26/19 0344   04/26/19 0145  Lactic acid, plasma  Now then every 2 hours,   STAT      04/26/19 0145   04/26/19 0145  Urine culture  ONCE - STAT,   STAT     04/26/19 0145   04/26/19 0009  Blood culture (routine x 2)  BLOOD CULTURE X 2,   STAT     04/26/19 0008          Vitals/Pain Today's Vitals   04/26/19 0229 04/26/19 0230 04/26/19 0304 04/26/19 0333  BP:  115/73 (!) 142/82 131/81  Pulse:  (!) 112 (!) 112 (!) 113  Resp:  (!) 21 (!) 22 20  Temp:    98.9 F (37.2 C)  TempSrc:    Oral  SpO2:  90% 95% 93%  Weight:      Height:      PainSc: 2        Isolation Precautions No active isolations  Medications Medications  vancomycin (VANCOCIN) 2,000 mg in sodium chloride 0.9 % 500 mL IVPB (2,000 mg Intravenous New Bag/Given 04/26/19 0246)  fentaNYL (SUBLIMAZE) injection 50 mcg (has no administration in time range)  0.9 %  sodium chloride infusion (has no administration in time range)  oxyCODONE-acetaminophen (PERCOCET/ROXICET) 5-325 MG per tablet 1-2 tablet (has no administration in time range)  acetaminophen (TYLENOL) tablet 650 mg (has no administration in time range)  HYDROmorphone (DILAUDID) injection 0.5-1 mg (has no administration in time range)  zolpidem (AMBIEN) tablet 5 mg (has no administration in time range)  diphenhydrAMINE (BENADRYL) injection 12.5 mg (has no administration in time range)    Or  diphenhydrAMINE (BENADRYL) 12.5 MG/5ML elixir 12.5 mg (has no administration in time range)  senna-docusate (Senokot-S) tablet 1 tablet (has no administration in time range)  bisacodyl (DULCOLAX) suppository 10 mg (has no administration in time range)  magnesium citrate solution 1 Bottle (has no administration in time range)  ondansetron (ZOFRAN) injection 4 mg (has no administration in time range)  mometasone-formoterol (DULERA) 200-5 MCG/ACT inhaler 2 puff (has no administration in time range)  buPROPion (WELLBUTRIN SR) 12 hr tablet 150 mg (has no administration in time range)  doxazosin (CARDURA) tablet 4 mg (has no administration in time range)   dutasteride (AVODART) capsule 0.5 mg (has no administration in time range)  fluticasone (FLONASE) 50 MCG/ACT nasal spray 1 spray (has no administration in time range)  montelukast (SINGULAIR) tablet 10 mg (has no administration in time range)  olmesartan-hydrochlorothiazide (BENICAR HCT) 20-12.5 MG per tablet 0.5 tablet (has no administration in time range)  pantoprazole (PROTONIX) EC tablet 40 mg (has no administration in time range)  simvastatin (ZOCOR) tablet 40 mg (has no administration in time range)  fentaNYL (SUBLIMAZE) injection 100 mcg (100 mcg Intravenous Given 04/26/19 0208)  ceFEPIme (MAXIPIME) 2 g in sodium chloride 0.9 % 100 mL IVPB (0 g Intravenous Stopped 04/26/19 0243)  metroNIDAZOLE (FLAGYL) IVPB 500 mg (0 mg Intravenous Stopped 04/26/19 0327)  sodium chloride 0.9 % bolus 1,000 mL (1,000 mLs Intravenous Bolus from Bag 04/26/19 0328)    Mobility walks Low fall risk     R Recommendations: See Admitting Provider Note  Report given to:    4E

## 2019-04-27 DIAGNOSIS — N179 Acute kidney failure, unspecified: Secondary | ICD-10-CM | POA: Diagnosis present

## 2019-04-27 DIAGNOSIS — E782 Mixed hyperlipidemia: Secondary | ICD-10-CM | POA: Diagnosis present

## 2019-04-27 DIAGNOSIS — K219 Gastro-esophageal reflux disease without esophagitis: Secondary | ICD-10-CM | POA: Diagnosis present

## 2019-04-27 DIAGNOSIS — K5909 Other constipation: Secondary | ICD-10-CM | POA: Diagnosis present

## 2019-04-27 DIAGNOSIS — Z20828 Contact with and (suspected) exposure to other viral communicable diseases: Secondary | ICD-10-CM | POA: Diagnosis present

## 2019-04-27 DIAGNOSIS — Z825 Family history of asthma and other chronic lower respiratory diseases: Secondary | ICD-10-CM | POA: Diagnosis not present

## 2019-04-27 DIAGNOSIS — J309 Allergic rhinitis, unspecified: Secondary | ICD-10-CM | POA: Diagnosis present

## 2019-04-27 DIAGNOSIS — Z87442 Personal history of urinary calculi: Secondary | ICD-10-CM | POA: Diagnosis not present

## 2019-04-27 DIAGNOSIS — N202 Calculus of kidney with calculus of ureter: Secondary | ICD-10-CM | POA: Diagnosis present

## 2019-04-27 DIAGNOSIS — I10 Essential (primary) hypertension: Secondary | ICD-10-CM | POA: Diagnosis present

## 2019-04-27 DIAGNOSIS — F17210 Nicotine dependence, cigarettes, uncomplicated: Secondary | ICD-10-CM | POA: Diagnosis present

## 2019-04-27 DIAGNOSIS — Z7951 Long term (current) use of inhaled steroids: Secondary | ICD-10-CM | POA: Diagnosis not present

## 2019-04-27 DIAGNOSIS — Z91030 Bee allergy status: Secondary | ICD-10-CM | POA: Diagnosis not present

## 2019-04-27 DIAGNOSIS — J45909 Unspecified asthma, uncomplicated: Secondary | ICD-10-CM | POA: Diagnosis present

## 2019-04-27 DIAGNOSIS — Z8249 Family history of ischemic heart disease and other diseases of the circulatory system: Secondary | ICD-10-CM | POA: Diagnosis not present

## 2019-04-27 DIAGNOSIS — Z87891 Personal history of nicotine dependence: Secondary | ICD-10-CM | POA: Diagnosis not present

## 2019-04-27 DIAGNOSIS — F329 Major depressive disorder, single episode, unspecified: Secondary | ICD-10-CM | POA: Diagnosis present

## 2019-04-27 DIAGNOSIS — G8918 Other acute postprocedural pain: Secondary | ICD-10-CM | POA: Diagnosis not present

## 2019-04-27 DIAGNOSIS — Z96653 Presence of artificial knee joint, bilateral: Secondary | ICD-10-CM | POA: Diagnosis present

## 2019-04-27 DIAGNOSIS — N12 Tubulo-interstitial nephritis, not specified as acute or chronic: Secondary | ICD-10-CM | POA: Diagnosis present

## 2019-04-27 DIAGNOSIS — Z96641 Presence of right artificial hip joint: Secondary | ICD-10-CM | POA: Diagnosis present

## 2019-04-27 LAB — BASIC METABOLIC PANEL
Anion gap: 12 (ref 5–15)
BUN: 16 mg/dL (ref 8–23)
CO2: 21 mmol/L — ABNORMAL LOW (ref 22–32)
Calcium: 8.9 mg/dL (ref 8.9–10.3)
Chloride: 102 mmol/L (ref 98–111)
Creatinine, Ser: 1.56 mg/dL — ABNORMAL HIGH (ref 0.61–1.24)
GFR calc Af Amer: 52 mL/min — ABNORMAL LOW (ref >60)
GFR calc non Af Amer: 45 mL/min — ABNORMAL LOW (ref >60)
Glucose, Bld: 104 mg/dL — ABNORMAL HIGH (ref 70–99)
Potassium: 3.7 mmol/L (ref 3.5–5.1)
Sodium: 135 mmol/L (ref 135–145)

## 2019-04-27 LAB — URINE CULTURE: Culture: NO GROWTH

## 2019-04-27 NOTE — Progress Notes (Signed)
Subjective/Chief Complaint:  1 - Fevers / Pyelonephritis After Stone Surgery  - new fevers to 102 10/23 2 days after succesful left percutanous stone surgery. UCX 10/19 negative. UCX,BCX 10/23 NGTD/pending, placed on empiric rocephin.  2 - Left Renal Stone - s/p left PCNL 04/24/19. CT 10/23 with excellent stone removal and some perinephric fluid as expected and left JJ stent in good position.   Today "Tony Douglas" is improving. Fevers only low grade past 24 hours.    Objective: Vital signs in last 24 hours: Temp:  [98.2 F (36.8 C)-99.8 F (37.7 C)] 98.7 F (37.1 C) (10/24 0404) Pulse Rate:  [101-110] 105 (10/24 0404) Resp:  [16-20] 16 (10/24 0404) BP: (126-157)/(72-82) 138/74 (10/24 0404) SpO2:  [94 %-99 %] 94 % (10/24 0404) Weight:  [105.1 kg] 105.1 kg (10/23 0631) Last BM Date: 04/24/19  Intake/Output from previous day: 10/23 0701 - 10/24 0700 In: 814.3 [I.V.:714.3; IV Piggyback:100] Out: 2225 [Urine:2225] Intake/Output this shift: Total I/O In: 274.5 [I.V.:274.5] Out: 1725 [Urine:1725]  General appearance: alert and cooperative Eyes: negative Nose: Nares normal. Septum midline. Mucosa normal. No drainage or sinus tenderness. Throat: lips, mucosa, and tongue normal; teeth and gums normal Neck: supple, symmetrical, trachea midline Back: symmetric, no curvature. ROM normal. No CVA tenderness., recent left PCNL site w/o hematomas or fluctuence.  Resp: Non-labored.  Cardio: Nl rate GI: soft, non-tender; bowel sounds normal; no masses,  no organomegaly Male genitalia: normal Extremities: extremities normal, atraumatic, no cyanosis or edema  Lab Results:  Recent Labs    04/25/19 0518 04/26/19 0145  WBC 10.0 11.4*  HGB 10.8* 10.3*  HCT 33.3* 31.3*  PLT 203 189   BMET Recent Labs    04/25/19 0518 04/26/19 0145  NA 135 134*  K 4.3 3.7  CL 103 102  CO2 20* 22  GLUCOSE 152* 122*  BUN 20 19  CREATININE 1.34* 1.74*  CALCIUM 9.0 9.3   PT/INR Recent Labs     04/26/19 0145  LABPROT 14.0  INR 1.1   ABG No results for input(s): PHART, HCO3 in the last 72 hours.  Invalid input(s): PCO2, PO2  Studies/Results: Dg Chest 2 View  Result Date: 04/26/2019 CLINICAL DATA:  Fever EXAM: CHEST - 2 VIEW COMPARISON:  May 14, 2012 FINDINGS: The heart size and mediastinal contours are within normal limits. There is mild hazy airspace opacity at the right lung base. Streaky opacity seen within the left mid lung. No acute osseous abnormality. There is mildly dilated air-filled loop of small bowel in the left upper abdomen measuring 4.3 cm. IMPRESSION: 1. Nonspecific hazy airspace opacity at the right lung base and streaky opacity in the left mid lung. This could be due to atelectasis and/or early infectious etiology. 2. Mildly dilated air-filled loop of bowel in the left upper abdomen. Electronically Signed   By: Prudencio Pair M.D.   On: 04/26/2019 00:44   Ct Renal Stone Study  Result Date: 04/26/2019 CLINICAL DATA:  Back pain fever abdominal pain, head percutaneous nephrolithotomy and stent placement with drain removal EXAM: CT ABDOMEN AND PELVIS WITHOUT CONTRAST TECHNIQUE: Multidetector CT imaging of the abdomen and pelvis was performed following the standard protocol without IV contrast. COMPARISON:  February 11, 2019 FINDINGS: Lower chest: The visualized heart size within normal limits. No pericardial fluid/thickening. No hiatal hernia. Streaky atelectasis or scarring at both lung bases. Hepatobiliary: Although limited due to the lack of intravenous contrast, normal in appearance without gross focal abnormality. No evidence of calcified gallstones or biliary ductal dilatation.  Pancreas:  Unremarkable.  No surrounding inflammatory changes. Spleen: Normal in size. Although limited due to the lack of intravenous contrast, normal in appearance. Adrenals/Urinary Tract: Both adrenal glands appear normal. There is significant amount of left-sided perinephric stranding with  fluid and hyperdense blood products seen extending in the retroperitoneum and along the psoas. There is free air is seen surrounding the left kidney and ureteral drain. There is a double-J left-sided ureteral stent with the tip coiled in the bladder. No hydronephrosis is seen. There is also mild right perinephric standing with a punctate calcification in the upper pole of the right kidney. No right-sided hydronephrosis. Stomach/Bowel: The stomach, small bowel, and colon are normal in appearance. No inflammatory changes or obstructive findings. appendix is normal. Vascular/Lymphatic: There are no enlarged abdominal or pelvic lymph nodes. Scattered aortic atherosclerotic calcifications are seen without aneurysmal dilatation. Reproductive: The prostate is unremarkable. Other: Small fat containing bilateral inguinal hernias are seen. Small amount of fluid seen within the left inguinal hernia. There are fat stranding changes seen along the left anterior subcutaneous soft tissues. No loculated fluid collection however is noted. Musculoskeletal: No acute or significant osseous findings. IMPRESSION: 1. Significant left perinephric stranding with fluid and blood products extending in the retroperitoneum ,anterior to the psoas musculature into the pelvis. This could be due to changes from posttraumatic recent drain removal, however cannot exclude active bleed. 2. There is also a small amount of pneumoperitoneum seen surrounding the right kidney and proximal ureter. 3. Double-J left ureteral stent with the tip coiled in the posterior bladder. 4. Mild amount of stranding changes along the posterior left subcutaneous tissues. No loculated fluid collection. Electronically Signed   By: Prudencio Pair M.D.   On: 04/26/2019 03:19    Anti-infectives: Anti-infectives (From admission, onward)   Start     Dose/Rate Route Frequency Ordered Stop   04/26/19 1400  cefTRIAXone (ROCEPHIN) 2 g in sodium chloride 0.9 % 100 mL IVPB     2  g 200 mL/hr over 30 Minutes Intravenous Every 24 hours 04/26/19 1104     04/26/19 1100  cefTRIAXone (ROCEPHIN) injection 1 g  Status:  Discontinued     1 g Intramuscular Every 24 hours 04/26/19 1054 04/26/19 1104   04/26/19 0200  ceFEPIme (MAXIPIME) 2 g in sodium chloride 0.9 % 100 mL IVPB     2 g 200 mL/hr over 30 Minutes Intravenous  Once 04/26/19 0147 04/26/19 0243   04/26/19 0200  metroNIDAZOLE (FLAGYL) IVPB 500 mg     500 mg 100 mL/hr over 60 Minutes Intravenous  Once 04/26/19 0147 04/26/19 0327   04/26/19 0200  vancomycin (VANCOCIN) IVPB 1000 mg/200 mL premix  Status:  Discontinued     1,000 mg 200 mL/hr over 60 Minutes Intravenous  Once 04/26/19 0147 04/26/19 0152   04/26/19 0200  vancomycin (VANCOCIN) 2,000 mg in sodium chloride 0.9 % 500 mL IVPB     2,000 mg 250 mL/hr over 120 Minutes Intravenous  Once 04/26/19 0152 04/26/19 0454      Assessment/Plan:  1 - Fevers / Pyelonephritis After Stone Surgery  - continue current ABX pending additional CX data and/or afebrile x 24 hours.   2 - Left Renal Stone - now stone free, great result.  Remian in house for now on IV ABX.   Alexis Frock 04/27/2019

## 2019-04-28 MED ORDER — AMOXICILLIN-POT CLAVULANATE 875-125 MG PO TABS: 1.0000 | ORAL_TABLET | Freq: Two times a day (BID) | ORAL | 0 refills | Status: AC

## 2019-04-28 NOTE — Discharge Instructions (Signed)
1 - You may have urinary urgency (bladder spasms) and bloody urine on / off with stent in place. This is normal. ° °2 - Call MD or go to ER for fever >102, severe pain / nausea / vomiting not relieved by medications, or acute change in medical status ° °

## 2019-04-28 NOTE — Discharge Summary (Signed)
Physician Discharge Summary  Patient ID: Tony Douglas MRN: [id number] DOB/AGE: 1950-08-30 68 y.o.  Admit date: 04/26/2019 Discharge date: 04/28/2019  Admission Diagnoses: Fevers after percutaneous stone surgery, mild pyelonephritis  Discharge Diagnoses:  Active Problems:   Fever   Discharged Condition: good  Hospital Course:   1 - Fevers / Pyelonephritis After Stone Surgery  - new fevers to 102 10/23 2 days after succesful left percutanous stone surgery. UCX 10/19 negative. UCX,BCX 10/23 NGTD/pending, placed on empiric rocephin. Fevers quickly resolved such that afebrile x 24 hours at time of discharge. NO growth by CX's at DC and bridged to 4 days additional augmentin.   2 - Left Renal Stone - s/p left PCNL 04/24/19. CT 10/23 with excellent stone removal and some perinephric fluid as expected and left JJ stent in good position.     Consults: None  Significant Diagnostic Studies: labs: as per above  Treatments: antibiotics: ceftriaxone  Discharge Exam: Blood pressure (!) 141/80, pulse (!) 102, temperature 97.6 F (36.4 C), temperature source Oral, resp. rate 14, height 5\' 10"  (1.778 m), weight 105.1 kg, SpO2 93 %. General appearance: alert, cooperative, appears stated age and pleasant, playing on cell phone.  Eyes: negative Nose: Nares normal. Septum midline. Mucosa normal. No drainage or sinus tenderness. Throat: lips, mucosa, and tongue normal; teeth and gums normal Neck: supple, symmetrical, trachea midline Back: symmetric, no curvature. ROM normal. No CVA tenderness., left PCNL site c/d/i with dry dressing. No hematomas. No crepitus / fluctuence.  Resp: non-labored on room air.  Cardio: Nl rate Male genitalia: normal Extremities: extremities normal, atraumatic, no cyanosis or edema Pulses: 2+ and symmetric Skin: Skin color, texture, turgor normal. No rashes or lesions Lymph nodes: Cervical, supraclavicular, and axillary nodes normal. Neurologic: Grossly  normal  Disposition:  There are no questions and answers to display.      HOME      Signed: Alexis Frock 04/28/2019, 10:34 AM

## 2019-04-30 DIAGNOSIS — Z23 Encounter for immunization: Secondary | ICD-10-CM | POA: Diagnosis not present

## 2019-04-30 DIAGNOSIS — H2513 Age-related nuclear cataract, bilateral: Secondary | ICD-10-CM | POA: Diagnosis not present

## 2019-05-01 LAB — CULTURE, BLOOD (ROUTINE X 2)
Culture: NO GROWTH
Culture: NO GROWTH
Special Requests: ADEQUATE
Special Requests: ADEQUATE

## 2019-05-08 DIAGNOSIS — N202 Calculus of kidney with calculus of ureter: Secondary | ICD-10-CM | POA: Diagnosis not present

## 2019-05-28 DIAGNOSIS — N12 Tubulo-interstitial nephritis, not specified as acute or chronic: Secondary | ICD-10-CM | POA: Diagnosis not present

## 2019-05-28 DIAGNOSIS — I10 Essential (primary) hypertension: Secondary | ICD-10-CM | POA: Diagnosis not present

## 2019-05-28 DIAGNOSIS — E782 Mixed hyperlipidemia: Secondary | ICD-10-CM | POA: Diagnosis not present

## 2019-05-28 DIAGNOSIS — F331 Major depressive disorder, recurrent, moderate: Secondary | ICD-10-CM | POA: Diagnosis not present

## 2019-05-28 DIAGNOSIS — N400 Enlarged prostate without lower urinary tract symptoms: Secondary | ICD-10-CM | POA: Diagnosis not present

## 2019-05-28 DIAGNOSIS — M25562 Pain in left knee: Secondary | ICD-10-CM | POA: Diagnosis not present

## 2019-05-28 DIAGNOSIS — Z6832 Body mass index (BMI) 32.0-32.9, adult: Secondary | ICD-10-CM | POA: Diagnosis not present

## 2019-05-28 DIAGNOSIS — J449 Chronic obstructive pulmonary disease, unspecified: Secondary | ICD-10-CM | POA: Diagnosis not present

## 2019-06-03 DIAGNOSIS — E782 Mixed hyperlipidemia: Secondary | ICD-10-CM | POA: Diagnosis not present

## 2019-06-03 DIAGNOSIS — I10 Essential (primary) hypertension: Secondary | ICD-10-CM | POA: Diagnosis not present

## 2019-08-02 DIAGNOSIS — I10 Essential (primary) hypertension: Secondary | ICD-10-CM | POA: Diagnosis not present

## 2019-08-02 DIAGNOSIS — E7849 Other hyperlipidemia: Secondary | ICD-10-CM | POA: Diagnosis not present

## 2019-10-02 DIAGNOSIS — E7849 Other hyperlipidemia: Secondary | ICD-10-CM | POA: Diagnosis not present

## 2019-10-02 DIAGNOSIS — I10 Essential (primary) hypertension: Secondary | ICD-10-CM | POA: Diagnosis not present

## 2019-10-17 DIAGNOSIS — R339 Retention of urine, unspecified: Secondary | ICD-10-CM | POA: Diagnosis not present

## 2019-10-17 DIAGNOSIS — D509 Iron deficiency anemia, unspecified: Secondary | ICD-10-CM | POA: Diagnosis not present

## 2019-10-17 DIAGNOSIS — D529 Folate deficiency anemia, unspecified: Secondary | ICD-10-CM | POA: Diagnosis not present

## 2019-10-17 DIAGNOSIS — D510 Vitamin B12 deficiency anemia due to intrinsic factor deficiency: Secondary | ICD-10-CM | POA: Diagnosis not present

## 2019-10-17 DIAGNOSIS — E782 Mixed hyperlipidemia: Secondary | ICD-10-CM | POA: Diagnosis not present

## 2019-10-17 DIAGNOSIS — Z0001 Encounter for general adult medical examination with abnormal findings: Secondary | ICD-10-CM | POA: Diagnosis not present

## 2019-10-29 DIAGNOSIS — Z6832 Body mass index (BMI) 32.0-32.9, adult: Secondary | ICD-10-CM | POA: Diagnosis not present

## 2019-10-29 DIAGNOSIS — I10 Essential (primary) hypertension: Secondary | ICD-10-CM | POA: Diagnosis not present

## 2019-10-29 DIAGNOSIS — N400 Enlarged prostate without lower urinary tract symptoms: Secondary | ICD-10-CM | POA: Diagnosis not present

## 2019-10-29 DIAGNOSIS — F331 Major depressive disorder, recurrent, moderate: Secondary | ICD-10-CM | POA: Diagnosis not present

## 2019-10-29 DIAGNOSIS — Z1212 Encounter for screening for malignant neoplasm of rectum: Secondary | ICD-10-CM | POA: Diagnosis not present

## 2019-10-29 DIAGNOSIS — Z0001 Encounter for general adult medical examination with abnormal findings: Secondary | ICD-10-CM | POA: Diagnosis not present

## 2019-10-29 DIAGNOSIS — J449 Chronic obstructive pulmonary disease, unspecified: Secondary | ICD-10-CM | POA: Diagnosis not present

## 2019-10-29 DIAGNOSIS — E782 Mixed hyperlipidemia: Secondary | ICD-10-CM | POA: Diagnosis not present

## 2019-11-05 DIAGNOSIS — E538 Deficiency of other specified B group vitamins: Secondary | ICD-10-CM | POA: Diagnosis not present

## 2019-11-12 DIAGNOSIS — E538 Deficiency of other specified B group vitamins: Secondary | ICD-10-CM | POA: Diagnosis not present

## 2019-11-19 DIAGNOSIS — E538 Deficiency of other specified B group vitamins: Secondary | ICD-10-CM | POA: Diagnosis not present

## 2019-12-03 DIAGNOSIS — D519 Vitamin B12 deficiency anemia, unspecified: Secondary | ICD-10-CM | POA: Diagnosis not present

## 2019-12-03 DIAGNOSIS — D649 Anemia, unspecified: Secondary | ICD-10-CM | POA: Diagnosis not present

## 2019-12-03 DIAGNOSIS — E538 Deficiency of other specified B group vitamins: Secondary | ICD-10-CM | POA: Diagnosis not present

## 2019-12-03 DIAGNOSIS — D529 Folate deficiency anemia, unspecified: Secondary | ICD-10-CM | POA: Diagnosis not present

## 2020-01-02 DIAGNOSIS — E538 Deficiency of other specified B group vitamins: Secondary | ICD-10-CM | POA: Diagnosis not present

## 2020-01-28 DIAGNOSIS — M1712 Unilateral primary osteoarthritis, left knee: Secondary | ICD-10-CM | POA: Diagnosis not present

## 2020-01-28 DIAGNOSIS — M47816 Spondylosis without myelopathy or radiculopathy, lumbar region: Secondary | ICD-10-CM | POA: Diagnosis not present

## 2020-01-29 DIAGNOSIS — D649 Anemia, unspecified: Secondary | ICD-10-CM | POA: Diagnosis not present

## 2020-01-29 DIAGNOSIS — D519 Vitamin B12 deficiency anemia, unspecified: Secondary | ICD-10-CM | POA: Diagnosis not present

## 2020-01-29 DIAGNOSIS — D529 Folate deficiency anemia, unspecified: Secondary | ICD-10-CM | POA: Diagnosis not present

## 2020-01-29 DIAGNOSIS — E538 Deficiency of other specified B group vitamins: Secondary | ICD-10-CM | POA: Diagnosis not present

## 2020-01-31 DIAGNOSIS — E7849 Other hyperlipidemia: Secondary | ICD-10-CM | POA: Diagnosis not present

## 2020-01-31 DIAGNOSIS — I10 Essential (primary) hypertension: Secondary | ICD-10-CM | POA: Diagnosis not present

## 2020-01-31 DIAGNOSIS — K219 Gastro-esophageal reflux disease without esophagitis: Secondary | ICD-10-CM | POA: Diagnosis not present

## 2020-01-31 DIAGNOSIS — N12 Tubulo-interstitial nephritis, not specified as acute or chronic: Secondary | ICD-10-CM | POA: Diagnosis not present

## 2020-02-11 DIAGNOSIS — M1712 Unilateral primary osteoarthritis, left knee: Secondary | ICD-10-CM | POA: Diagnosis not present

## 2020-02-11 DIAGNOSIS — M5126 Other intervertebral disc displacement, lumbar region: Secondary | ICD-10-CM | POA: Diagnosis not present

## 2020-02-15 DIAGNOSIS — M545 Low back pain: Secondary | ICD-10-CM | POA: Diagnosis not present

## 2020-02-18 DIAGNOSIS — M47816 Spondylosis without myelopathy or radiculopathy, lumbar region: Secondary | ICD-10-CM | POA: Diagnosis not present

## 2020-02-18 DIAGNOSIS — M1712 Unilateral primary osteoarthritis, left knee: Secondary | ICD-10-CM | POA: Diagnosis not present

## 2020-02-28 DIAGNOSIS — E538 Deficiency of other specified B group vitamins: Secondary | ICD-10-CM | POA: Diagnosis not present

## 2020-03-02 DIAGNOSIS — M47816 Spondylosis without myelopathy or radiculopathy, lumbar region: Secondary | ICD-10-CM | POA: Diagnosis not present

## 2020-03-02 DIAGNOSIS — M545 Low back pain: Secondary | ICD-10-CM | POA: Diagnosis not present

## 2020-03-03 DIAGNOSIS — K219 Gastro-esophageal reflux disease without esophagitis: Secondary | ICD-10-CM | POA: Diagnosis not present

## 2020-03-03 DIAGNOSIS — E7849 Other hyperlipidemia: Secondary | ICD-10-CM | POA: Diagnosis not present

## 2020-03-03 DIAGNOSIS — N12 Tubulo-interstitial nephritis, not specified as acute or chronic: Secondary | ICD-10-CM | POA: Diagnosis not present

## 2020-03-03 DIAGNOSIS — I10 Essential (primary) hypertension: Secondary | ICD-10-CM | POA: Diagnosis not present

## 2020-03-30 ENCOUNTER — Other Ambulatory Visit: Payer: Self-pay

## 2020-03-30 ENCOUNTER — Other Ambulatory Visit: Payer: Medicare HMO

## 2020-03-30 DIAGNOSIS — Z20822 Contact with and (suspected) exposure to covid-19: Secondary | ICD-10-CM | POA: Diagnosis not present

## 2020-03-30 DIAGNOSIS — M545 Low back pain: Secondary | ICD-10-CM | POA: Diagnosis not present

## 2020-03-30 DIAGNOSIS — M47816 Spondylosis without myelopathy or radiculopathy, lumbar region: Secondary | ICD-10-CM | POA: Diagnosis not present

## 2020-03-31 DIAGNOSIS — E538 Deficiency of other specified B group vitamins: Secondary | ICD-10-CM | POA: Diagnosis not present

## 2020-03-31 LAB — SARS-COV-2, NAA 2 DAY TAT

## 2020-03-31 LAB — NOVEL CORONAVIRUS, NAA: SARS-CoV-2, NAA: NOT DETECTED

## 2020-03-31 LAB — SPECIMEN STATUS REPORT

## 2020-04-02 DIAGNOSIS — N12 Tubulo-interstitial nephritis, not specified as acute or chronic: Secondary | ICD-10-CM | POA: Diagnosis not present

## 2020-04-02 DIAGNOSIS — K219 Gastro-esophageal reflux disease without esophagitis: Secondary | ICD-10-CM | POA: Diagnosis not present

## 2020-04-02 DIAGNOSIS — I10 Essential (primary) hypertension: Secondary | ICD-10-CM | POA: Diagnosis not present

## 2020-04-02 DIAGNOSIS — E7849 Other hyperlipidemia: Secondary | ICD-10-CM | POA: Diagnosis not present

## 2020-04-06 DIAGNOSIS — L304 Erythema intertrigo: Secondary | ICD-10-CM | POA: Diagnosis not present

## 2020-04-06 DIAGNOSIS — Z6832 Body mass index (BMI) 32.0-32.9, adult: Secondary | ICD-10-CM | POA: Diagnosis not present

## 2020-04-06 DIAGNOSIS — Z23 Encounter for immunization: Secondary | ICD-10-CM | POA: Diagnosis not present

## 2020-04-17 DIAGNOSIS — K219 Gastro-esophageal reflux disease without esophagitis: Secondary | ICD-10-CM | POA: Diagnosis not present

## 2020-04-17 DIAGNOSIS — D171 Benign lipomatous neoplasm of skin and subcutaneous tissue of trunk: Secondary | ICD-10-CM | POA: Diagnosis not present

## 2020-04-17 DIAGNOSIS — I10 Essential (primary) hypertension: Secondary | ICD-10-CM | POA: Diagnosis not present

## 2020-04-17 DIAGNOSIS — R739 Hyperglycemia, unspecified: Secondary | ICD-10-CM | POA: Diagnosis not present

## 2020-04-17 DIAGNOSIS — D519 Vitamin B12 deficiency anemia, unspecified: Secondary | ICD-10-CM | POA: Diagnosis not present

## 2020-04-17 DIAGNOSIS — R946 Abnormal results of thyroid function studies: Secondary | ICD-10-CM | POA: Diagnosis not present

## 2020-04-17 DIAGNOSIS — J449 Chronic obstructive pulmonary disease, unspecified: Secondary | ICD-10-CM | POA: Diagnosis not present

## 2020-04-17 DIAGNOSIS — E782 Mixed hyperlipidemia: Secondary | ICD-10-CM | POA: Diagnosis not present

## 2020-04-17 DIAGNOSIS — D529 Folate deficiency anemia, unspecified: Secondary | ICD-10-CM | POA: Diagnosis not present

## 2020-04-17 DIAGNOSIS — D649 Anemia, unspecified: Secondary | ICD-10-CM | POA: Diagnosis not present

## 2020-04-20 DIAGNOSIS — E782 Mixed hyperlipidemia: Secondary | ICD-10-CM | POA: Diagnosis not present

## 2020-04-20 DIAGNOSIS — Z6833 Body mass index (BMI) 33.0-33.9, adult: Secondary | ICD-10-CM | POA: Diagnosis not present

## 2020-04-20 DIAGNOSIS — J449 Chronic obstructive pulmonary disease, unspecified: Secondary | ICD-10-CM | POA: Diagnosis not present

## 2020-04-20 DIAGNOSIS — N400 Enlarged prostate without lower urinary tract symptoms: Secondary | ICD-10-CM | POA: Diagnosis not present

## 2020-04-20 DIAGNOSIS — J301 Allergic rhinitis due to pollen: Secondary | ICD-10-CM | POA: Diagnosis not present

## 2020-04-20 DIAGNOSIS — I10 Essential (primary) hypertension: Secondary | ICD-10-CM | POA: Diagnosis not present

## 2020-04-20 DIAGNOSIS — F331 Major depressive disorder, recurrent, moderate: Secondary | ICD-10-CM | POA: Diagnosis not present

## 2020-04-20 DIAGNOSIS — K2100 Gastro-esophageal reflux disease with esophagitis, without bleeding: Secondary | ICD-10-CM | POA: Diagnosis not present

## 2020-04-20 DIAGNOSIS — M5416 Radiculopathy, lumbar region: Secondary | ICD-10-CM | POA: Diagnosis not present

## 2020-04-29 DIAGNOSIS — E538 Deficiency of other specified B group vitamins: Secondary | ICD-10-CM | POA: Diagnosis not present

## 2020-05-04 ENCOUNTER — Ambulatory Visit (INDEPENDENT_AMBULATORY_CARE_PROVIDER_SITE_OTHER): Payer: Medicare HMO | Admitting: Dermatology

## 2020-05-04 ENCOUNTER — Other Ambulatory Visit: Payer: Self-pay

## 2020-05-04 DIAGNOSIS — L308 Other specified dermatitis: Secondary | ICD-10-CM | POA: Diagnosis not present

## 2020-05-04 DIAGNOSIS — D485 Neoplasm of uncertain behavior of skin: Secondary | ICD-10-CM

## 2020-05-04 DIAGNOSIS — B360 Pityriasis versicolor: Secondary | ICD-10-CM

## 2020-05-04 DIAGNOSIS — L821 Other seborrheic keratosis: Secondary | ICD-10-CM | POA: Diagnosis not present

## 2020-05-04 LAB — POCT SKIN KOH: Skin KOH, POC: POSITIVE — AB

## 2020-05-04 NOTE — Patient Instructions (Signed)

## 2020-05-11 ENCOUNTER — Telehealth: Payer: Self-pay

## 2020-05-11 ENCOUNTER — Encounter: Payer: Self-pay | Admitting: Dermatology

## 2020-05-11 NOTE — Telephone Encounter (Signed)
Received call from patients wife who wanted me to interpret the results of a sik Bx done by dermatology. I advised her to call dermatology in the AM for the result or even his PCP.  She verbalized understanding of all information

## 2020-05-11 NOTE — Progress Notes (Signed)
   Follow-Up Visit   Subjective  Tony Douglas is a 69 y.o. male who presents for the following: Skin Problem (patient has some spots on scalp that we wants checked. Crusty spots. Patients wife says he has some spots on back that need to be looked at. Wife in room with patient., ).  Discoloration Location: Front of scalp duration:  Quality: Several months Associated Signs/Symptoms: Slowly spreading modifying Factors:  Severity:  Timing: Context:   Objective  Well appearing patient in no apparent distress; mood and affect are within normal limits.  All skin waist up examined.  Plus head neck lymph glands.   Assessment & Plan    Tinea versicolor Mid Back  If this spreads or bothersome, he will try over-the-counter 1 to 2% Chlortrimazole cream or lotion daily after bathing for 1 month.  The discoloration will take months to blend.  POCT Skin KOH - Mid Back  Seborrheic keratosis (2) Right Lower Leg - Anterior; Scalp  Benign no treatment needed if stable.  Neoplasm of uncertain behavior of skin (2) Mid Frontal Scalp  Skin / nail biopsy Type of biopsy: punch   Informed consent: discussed and consent obtained   Timeout: patient name, date of birth, surgical site, and procedure verified   Procedure prep:  Patient was prepped and draped in usual sterile fashion (Non sterile) Prep type:  Chlorhexidine Anesthesia: the lesion was anesthetized in a standard fashion   Anesthetic:  1% lidocaine w/ epinephrine 1-100,000 local infiltration Punch size:  3 mm Suture size:  4-0 Suture type: nylon   Suture removal (days):  10 Hemostasis achieved with: suture   Outcome: patient tolerated procedure well   Post-procedure details: wound care instructions given    Specimen 1 - Surgical pathology Differential Diagnosis: persistant subtle but extensive scalp erythema, rule out angiosarcoma. Check Margins: No  Mid Parietal Scalp  Skin / nail biopsy Type of biopsy: punch   Informed  consent: discussed and consent obtained   Procedure prep:  Patient was prepped and draped in usual sterile fashion (nonsterile) Prep type:  Chlorhexidine Anesthesia: the lesion was anesthetized in a standard fashion   Anesthetic:  1% lidocaine w/ epinephrine 1-100,000 local infiltration Punch size:  3 mm Suture size:  4-0 Suture removal (days):  10 Outcome: patient tolerated procedure well   Post-procedure details: wound care instructions given   Post-procedure details comment:  Nonsterile  Specimen 2 - Surgical pathology Differential Diagnosis: persistant subtle but extensive scalp erythema, rule out angiosarcoma.  Check Margins: No  Punch biopsy x2 to rule out the rare possibility of angiosarcoma.     I, Lavonna Monarch, MD, have reviewed all documentation for this visit.  The documentation on 05/11/20 for the exam, diagnosis, procedures, and orders are all accurate and complete.

## 2020-05-12 ENCOUNTER — Telehealth: Payer: Self-pay | Admitting: Dermatology

## 2020-05-12 NOTE — Telephone Encounter (Signed)
Phone call to patient's wife to inform her that the patient's pathology results aren't back yet and once we get the results and Dr. Denna Haggard reviews the pathology we will give them a call back.

## 2020-05-12 NOTE — Telephone Encounter (Signed)
Patient wife calling (DPR on file) because she has questions about results on MyChart.

## 2020-05-13 ENCOUNTER — Ambulatory Visit (INDEPENDENT_AMBULATORY_CARE_PROVIDER_SITE_OTHER): Payer: Medicare HMO | Admitting: *Deleted

## 2020-05-13 ENCOUNTER — Other Ambulatory Visit: Payer: Self-pay

## 2020-05-13 DIAGNOSIS — Z4802 Encounter for removal of sutures: Secondary | ICD-10-CM

## 2020-05-13 NOTE — Progress Notes (Signed)
Patient is here to have his suture removed on scalp. Removed x 2 sutures. Spots look goo no signs of infection. Pathology is not back yet so patient will call Friday to check back with [place].

## 2020-05-15 ENCOUNTER — Telehealth: Payer: Self-pay | Admitting: *Deleted

## 2020-05-15 MED ORDER — HALOBETASOL PROPIONATE 0.05 % EX CREA: TOPICAL_CREAM | Freq: Two times a day (BID) | CUTANEOUS | 1 refills | Status: DC

## 2020-05-15 NOTE — Telephone Encounter (Signed)
Path to patient. Per Dr. Denna Haggard he will get a prescription for halobetasol cream or ointment to use x 30 days and call with report. The halobetasol is not on formulary. I told patient see what his pharmacy says about price and if its a lot of money don't get it and I will ask Dr. Denna Haggard Monday for an alternative.

## 2020-05-15 NOTE — Telephone Encounter (Signed)
Left patient a message for pathology results.  ?

## 2020-05-15 NOTE — Telephone Encounter (Signed)
-----   Message from Lavonna Monarch, MD sent at 05/14/2020  6:37 AM EST ----- Please inform patient that the biopsy pretty much excludes infection or more serious things like cancer but is not entirely specific.  I would like him to apply any type of halobetasol topical to the affected area daily for 30 days and then please contact us with a status report.

## 2020-05-18 NOTE — Telephone Encounter (Signed)
   Cash pay under 40$ with goodrx

## 2020-05-19 ENCOUNTER — Telehealth: Payer: Self-pay | Admitting: Dermatology

## 2020-05-19 NOTE — Telephone Encounter (Signed)
Wife said Rx may be too expensive and not covered by insurance (she doesn't know med name and he's out playing golf.)Said it wasn't in stock and they would have to order it. Price would be $52 and if ST thinks this isn't too expensive, they'll get it. Pharmacy:[facility], [facility]

## 2020-05-19 NOTE — Telephone Encounter (Signed)
May substitute generic clobetasol.  This may be less expensive but I [place].

## 2020-05-19 NOTE — Telephone Encounter (Signed)
See patient's message we did a PA on the Halobetasol.

## 2020-05-20 ENCOUNTER — Other Ambulatory Visit: Payer: Self-pay

## 2020-05-20 MED ORDER — HALOBETASOL PROPIONATE 0.05 % EX CREA: TOPICAL_CREAM | Freq: Two times a day (BID) | CUTANEOUS | 2 refills | Status: AC | PRN

## 2020-05-20 NOTE — Telephone Encounter (Signed)
Ointment and solution is available in Clobetasol per his insurance or cash pay for halobetasol

## 2020-05-20 NOTE — Telephone Encounter (Signed)
Patient aware goodrx 34.66 cash pay

## 2020-05-25 DIAGNOSIS — M545 Low back pain, unspecified: Secondary | ICD-10-CM | POA: Diagnosis not present

## 2020-05-25 DIAGNOSIS — M5416 Radiculopathy, lumbar region: Secondary | ICD-10-CM | POA: Diagnosis not present

## 2020-05-25 DIAGNOSIS — M47816 Spondylosis without myelopathy or radiculopathy, lumbar region: Secondary | ICD-10-CM | POA: Diagnosis not present

## 2020-06-03 DIAGNOSIS — E538 Deficiency of other specified B group vitamins: Secondary | ICD-10-CM | POA: Diagnosis not present

## 2020-06-16 DIAGNOSIS — Z23 Encounter for immunization: Secondary | ICD-10-CM | POA: Diagnosis not present

## 2020-07-03 DIAGNOSIS — I10 Essential (primary) hypertension: Secondary | ICD-10-CM | POA: Diagnosis not present

## 2020-07-03 DIAGNOSIS — N12 Tubulo-interstitial nephritis, not specified as acute or chronic: Secondary | ICD-10-CM | POA: Diagnosis not present

## 2020-07-03 DIAGNOSIS — E7849 Other hyperlipidemia: Secondary | ICD-10-CM | POA: Diagnosis not present

## 2020-07-03 DIAGNOSIS — K219 Gastro-esophageal reflux disease without esophagitis: Secondary | ICD-10-CM | POA: Diagnosis not present

## 2020-07-06 DIAGNOSIS — E538 Deficiency of other specified B group vitamins: Secondary | ICD-10-CM | POA: Diagnosis not present

## 2020-08-01 DIAGNOSIS — E7849 Other hyperlipidemia: Secondary | ICD-10-CM | POA: Diagnosis not present

## 2020-08-01 DIAGNOSIS — K219 Gastro-esophageal reflux disease without esophagitis: Secondary | ICD-10-CM | POA: Diagnosis not present

## 2020-08-01 DIAGNOSIS — I10 Essential (primary) hypertension: Secondary | ICD-10-CM | POA: Diagnosis not present

## 2020-08-01 DIAGNOSIS — N12 Tubulo-interstitial nephritis, not specified as acute or chronic: Secondary | ICD-10-CM | POA: Diagnosis not present

## 2020-08-06 DIAGNOSIS — E538 Deficiency of other specified B group vitamins: Secondary | ICD-10-CM | POA: Diagnosis not present

## 2020-09-03 DIAGNOSIS — E538 Deficiency of other specified B group vitamins: Secondary | ICD-10-CM | POA: Diagnosis not present

## 2020-09-30 DIAGNOSIS — E7849 Other hyperlipidemia: Secondary | ICD-10-CM | POA: Diagnosis not present

## 2020-09-30 DIAGNOSIS — K219 Gastro-esophageal reflux disease without esophagitis: Secondary | ICD-10-CM | POA: Diagnosis not present

## 2020-09-30 DIAGNOSIS — N12 Tubulo-interstitial nephritis, not specified as acute or chronic: Secondary | ICD-10-CM | POA: Diagnosis not present

## 2020-09-30 DIAGNOSIS — I10 Essential (primary) hypertension: Secondary | ICD-10-CM | POA: Diagnosis not present

## 2020-10-05 DIAGNOSIS — E538 Deficiency of other specified B group vitamins: Secondary | ICD-10-CM | POA: Diagnosis not present

## 2020-10-27 DIAGNOSIS — J019 Acute sinusitis, unspecified: Secondary | ICD-10-CM | POA: Diagnosis not present

## 2020-10-27 DIAGNOSIS — R509 Fever, unspecified: Secondary | ICD-10-CM | POA: Diagnosis not present

## 2020-11-02 DIAGNOSIS — J449 Chronic obstructive pulmonary disease, unspecified: Secondary | ICD-10-CM | POA: Diagnosis not present

## 2020-11-02 DIAGNOSIS — E7849 Other hyperlipidemia: Secondary | ICD-10-CM | POA: Diagnosis not present

## 2020-11-02 DIAGNOSIS — I10 Essential (primary) hypertension: Secondary | ICD-10-CM | POA: Diagnosis not present

## 2020-11-02 DIAGNOSIS — D649 Anemia, unspecified: Secondary | ICD-10-CM | POA: Diagnosis not present

## 2020-11-02 DIAGNOSIS — Z0001 Encounter for general adult medical examination with abnormal findings: Secondary | ICD-10-CM | POA: Diagnosis not present

## 2020-11-02 DIAGNOSIS — K2100 Gastro-esophageal reflux disease with esophagitis, without bleeding: Secondary | ICD-10-CM | POA: Diagnosis not present

## 2020-11-05 DIAGNOSIS — D649 Anemia, unspecified: Secondary | ICD-10-CM | POA: Diagnosis not present

## 2020-11-05 DIAGNOSIS — E538 Deficiency of other specified B group vitamins: Secondary | ICD-10-CM | POA: Diagnosis not present

## 2020-11-11 DIAGNOSIS — Z1212 Encounter for screening for malignant neoplasm of rectum: Secondary | ICD-10-CM | POA: Diagnosis not present

## 2020-11-11 DIAGNOSIS — R5383 Other fatigue: Secondary | ICD-10-CM | POA: Diagnosis not present

## 2020-11-11 DIAGNOSIS — I10 Essential (primary) hypertension: Secondary | ICD-10-CM | POA: Diagnosis not present

## 2020-11-11 DIAGNOSIS — E782 Mixed hyperlipidemia: Secondary | ICD-10-CM | POA: Diagnosis not present

## 2020-11-11 DIAGNOSIS — Z1159 Encounter for screening for other viral diseases: Secondary | ICD-10-CM | POA: Diagnosis not present

## 2020-11-11 DIAGNOSIS — D529 Folate deficiency anemia, unspecified: Secondary | ICD-10-CM | POA: Diagnosis not present

## 2020-11-11 DIAGNOSIS — E1165 Type 2 diabetes mellitus with hyperglycemia: Secondary | ICD-10-CM | POA: Diagnosis not present

## 2020-11-11 DIAGNOSIS — N400 Enlarged prostate without lower urinary tract symptoms: Secondary | ICD-10-CM | POA: Diagnosis not present

## 2020-11-11 DIAGNOSIS — E039 Hypothyroidism, unspecified: Secondary | ICD-10-CM | POA: Diagnosis not present

## 2020-11-11 DIAGNOSIS — D649 Anemia, unspecified: Secondary | ICD-10-CM | POA: Diagnosis not present

## 2020-11-11 DIAGNOSIS — E538 Deficiency of other specified B group vitamins: Secondary | ICD-10-CM | POA: Diagnosis not present

## 2020-11-11 DIAGNOSIS — Z125 Encounter for screening for malignant neoplasm of prostate: Secondary | ICD-10-CM | POA: Diagnosis not present

## 2020-11-14 DIAGNOSIS — J301 Allergic rhinitis due to pollen: Secondary | ICD-10-CM | POA: Diagnosis not present

## 2020-11-14 DIAGNOSIS — N400 Enlarged prostate without lower urinary tract symptoms: Secondary | ICD-10-CM | POA: Diagnosis not present

## 2020-11-14 DIAGNOSIS — E7849 Other hyperlipidemia: Secondary | ICD-10-CM | POA: Diagnosis not present

## 2020-11-14 DIAGNOSIS — I10 Essential (primary) hypertension: Secondary | ICD-10-CM | POA: Diagnosis not present

## 2020-11-14 DIAGNOSIS — J449 Chronic obstructive pulmonary disease, unspecified: Secondary | ICD-10-CM | POA: Diagnosis not present

## 2020-11-14 DIAGNOSIS — Z0001 Encounter for general adult medical examination with abnormal findings: Secondary | ICD-10-CM | POA: Diagnosis not present

## 2020-11-14 DIAGNOSIS — F331 Major depressive disorder, recurrent, moderate: Secondary | ICD-10-CM | POA: Diagnosis not present

## 2020-11-14 DIAGNOSIS — K2100 Gastro-esophageal reflux disease with esophagitis, without bleeding: Secondary | ICD-10-CM | POA: Diagnosis not present

## 2020-12-07 DIAGNOSIS — E538 Deficiency of other specified B group vitamins: Secondary | ICD-10-CM | POA: Diagnosis not present

## 2020-12-31 DIAGNOSIS — K219 Gastro-esophageal reflux disease without esophagitis: Secondary | ICD-10-CM | POA: Diagnosis not present

## 2020-12-31 DIAGNOSIS — I10 Essential (primary) hypertension: Secondary | ICD-10-CM | POA: Diagnosis not present

## 2020-12-31 DIAGNOSIS — E7849 Other hyperlipidemia: Secondary | ICD-10-CM | POA: Diagnosis not present

## 2020-12-31 DIAGNOSIS — N12 Tubulo-interstitial nephritis, not specified as acute or chronic: Secondary | ICD-10-CM | POA: Diagnosis not present

## 2021-01-06 DIAGNOSIS — E538 Deficiency of other specified B group vitamins: Secondary | ICD-10-CM | POA: Diagnosis not present

## 2021-01-20 DIAGNOSIS — Z23 Encounter for immunization: Secondary | ICD-10-CM | POA: Diagnosis not present

## 2021-02-08 DIAGNOSIS — E538 Deficiency of other specified B group vitamins: Secondary | ICD-10-CM | POA: Diagnosis not present

## 2021-03-04 ENCOUNTER — Emergency Department (HOSPITAL_COMMUNITY): Payer: Medicare HMO

## 2021-03-04 ENCOUNTER — Encounter (HOSPITAL_COMMUNITY): Payer: Self-pay | Admitting: *Deleted

## 2021-03-04 ENCOUNTER — Emergency Department (HOSPITAL_COMMUNITY)
Admission: EM | Admit: 2021-03-04 | Discharge: 2021-03-04 | Disposition: A | Discharge status: Home / Self Care | Payer: Medicare HMO | Attending: Emergency Medicine | Admitting: Emergency Medicine

## 2021-03-04 DIAGNOSIS — Z96641 Presence of right artificial hip joint: Secondary | ICD-10-CM | POA: Diagnosis not present

## 2021-03-04 DIAGNOSIS — R2242 Localized swelling, mass and lump, left lower limb: Secondary | ICD-10-CM | POA: Insufficient documentation

## 2021-03-04 DIAGNOSIS — M7731 Calcaneal spur, right foot: Secondary | ICD-10-CM | POA: Diagnosis not present

## 2021-03-04 DIAGNOSIS — I10 Essential (primary) hypertension: Secondary | ICD-10-CM | POA: Insufficient documentation

## 2021-03-04 DIAGNOSIS — Z7952 Long term (current) use of systemic steroids: Secondary | ICD-10-CM | POA: Diagnosis not present

## 2021-03-04 DIAGNOSIS — M25571 Pain in right ankle and joints of right foot: Secondary | ICD-10-CM | POA: Insufficient documentation

## 2021-03-04 DIAGNOSIS — J45909 Unspecified asthma, uncomplicated: Secondary | ICD-10-CM | POA: Insufficient documentation

## 2021-03-04 DIAGNOSIS — Z87891 Personal history of nicotine dependence: Secondary | ICD-10-CM | POA: Insufficient documentation

## 2021-03-04 DIAGNOSIS — Z96653 Presence of artificial knee joint, bilateral: Secondary | ICD-10-CM | POA: Diagnosis not present

## 2021-03-04 DIAGNOSIS — M19071 Primary osteoarthritis, right ankle and foot: Secondary | ICD-10-CM | POA: Diagnosis not present

## 2021-03-04 NOTE — ED Triage Notes (Signed)
Pain in right ankle

## 2021-03-04 NOTE — ED Provider Notes (Signed)
Emergency Medicine Provider Triage Evaluation Note  Tony Douglas , a 70 y.o. male  was evaluated in triage.  Pt complains of right ankle pain. Patient was playing golf today at 12pm and after a swing he notes he rolled his ankle and has had increased pain and swelling. No fall, no head injury.  Review of Systems  Positive: Right ankle pain Negative: Weakness, numbness, loss of sensation  Physical Exam  BP 129/83   Pulse 98   Temp 98.1 F (36.7 C)   Resp 20   Ht '5\' 10"'$  (1.778 m)   Wt 99.8 kg   SpO2 100%   BMI 31.57 kg/m  Gen:   Awake, no distress   Resp:  Normal effort  MSK:   Moves extremities without difficulty  Other:    Medical Decision Making  Medically screening exam initiated at 3:29 PM.  Appropriate orders placed.  Tony Douglas was informed that the remainder of the evaluation will be completed by another provider, this initial triage assessment does not replace that evaluation, and the importance of remaining in the ED until their evaluation is complete.  XR of right ankle ordered   Tony Douglas 03/04/21 [place], [place], MD 03/05/21 1314

## 2021-03-04 NOTE — ED Provider Notes (Signed)
[facility] EMERGENCY DEPARTMENT Provider Note   CSN: [id number] Arrival date & time: 03/04/21  1322     History Chief Complaint  Patient presents with   Ankle Pain    Tony Douglas is a 70 y.o. male who presents with right ankle pain.  Patient was playing golf today and noted that after a swing he felt pain in his right ankle.  Since that time pain has persisted and ankle appeared swollen.  Pain is worse with putting weight on it. No falls. Has not tried taking anything for his pain. No recent antibiotic use. No other complaints at this time.   Ankle Pain     Past Medical History:  Diagnosis Date   Allergic rhinitis    Asthma    followed by dr wert as needed, per pt controlled   Benign localized prostatic hyperplasia with lower urinary tract symptoms (LUTS)    Chronic constipation    Diverticulosis of colon    GERD (gastroesophageal reflux disease)    History of carpal tunnel syndrome    Bilateral   History of kidney stones    Hyperlipidemia, mixed    Hypertension    MDD (major depressive disorder)    Nephrolithiasis    per CT 02-11-2019 bilateral non-obstructive   Osteoarthritis    PONV (postoperative nausea and vomiting)    Right ureteral stone    Wears contact lenses     Patient Active Problem List   Diagnosis Date Noted   Fever 04/26/2019   Kidney stone 04/24/2019   Primary localized osteoarthritis of left knee 12/31/2018   Osteoarthritis of right hip 05/21/2014   Cough 11/14/2011   Hypertension 11/14/2011   Asthma, extrinsic 11/14/2011    Past Surgical History:  Procedure Laterality Date   APPENDECTOMY  1970s   CARPAL TUNNEL RELEASE Left 04/20/2017   Procedure: LEFT CARPAL TUNNEL RELEASE;  Surgeon: [name], MD;  Location: [place];  Service: Orthopedics;  Laterality: Left;   CARPAL TUNNEL RELEASE Right 05/04/2017   Procedure: CARPAL TUNNEL RELEASE;  Surgeon: [name], MD;  Location: [place];  Service:  Orthopedics;  Laterality: Right;   COLONOSCOPY     CYSTOSCOPY  04/24/2019   Procedure: CYSTOSCOPY FLEXIBLE;  Surgeon: Ardis Hughs, MD;  Location: [facility];  Service: Urology;;   CYSTOSCOPY/URETEROSCOPY/HOLMIUM LASER/STENT PLACEMENT Right 03/06/2019   Procedure: RIGHT URETEROSCOPY/HOLMIUM LASER/STENT PLACEMENT STONE BASKET RETRIVAL;  Surgeon: Ardis Hughs, MD;  Location: [facility];  Service: Urology;  Laterality: Right;   HAMMER TOE SURGERY Right yrs ago   2nd toe   INGUINAL HERNIA REPAIR Right 09-17-2003  '@[facility]'$    KNEE ARTHROSCOPY Right 2002   w/ post op arthroscopy debridement/ lavage 07-30-2000   KNEE ARTHROSCOPY WITH MEDIAL MENISECTOMY Left 01/14/2019   Procedure: KNEE ARTHROSCOPY WITH MEDIAL MENISECTOMY VERSES A POSSIBLE UNIT ARTHROPLASTY POSSIBLE A TOTAL ARTHROPLASTY;  Surgeon: Elsie Saas, MD;  Location: [facility];  Service: Orthopedics;  Laterality: Left;   LAPAROSCOPIC SIGMOID COLECTOMY  02-12-2003   '@[facility]'$    diverticulosis   NEPHROLITHOTOMY Left 04/24/2019   Procedure: NEPHROLITHOTOMY PERCUTANEOUS WITH SURGEON ACCESS;  Surgeon: Ardis Hughs, MD;  Location: [facility];  Service: Urology;  Laterality: Left;   SHOULDER ARTHROSCOPY DISTAL CLAVICLE EXCISION AND OPEN ROTATOR CUFF REPAIR Right 01-17-2008   dr Percell Miller   TOTAL HIP ARTHROPLASTY Right 05/21/2014   Procedure: RIGHT TOTAL HIP ARTHROPLASTY;  Surgeon: [name], MD;  Location: [place];  Service: Orthopedics;  Laterality:  Right;   TOTAL KNEE ARTHROPLASTY Right 12-16-2009   '@[facility]    TOTAL KNEE ARTHROPLASTY Left 01/14/2019   Procedure: TOTAL KNEE ARTHROPLASTY;  Surgeon: Elsie Saas, MD;  Location: [facility];  Service: Orthopedics;  Laterality: Left;       Family History  Problem Relation Age of Onset   Emphysema Father        smoked and worked in [place] Brother    Heart disease Mother     Social History   Tobacco Use   Smoking status: Former    Packs/day: 1.00     Years: 30.00    Pack years: 30.00    Types: Cigarettes    Quit date: 07/04/1992    Years since quitting: 28.6   Smokeless tobacco: Never  Vaping Use   Vaping Use: Never used  Substance Use Topics   Alcohol use: Not Currently    Comment: 1 or 2 beers per month   Drug use: No    Home Medications Prior to Admission medications   Medication Sig Start Date End Date Taking? Authorizing Provider  acetaminophen (TYLENOL) 650 MG CR tablet Take 1,300 mg by mouth every 8 (eight) hours as needed for pain.    [provider]  budesonide-formoterol (SYMBICORT) 160-4.5 MCG/ACT inhaler Take 2 puffs first thing in am and then another 2 puffs about 12 hours later. Patient taking differently: Inhale 2 puffs into the lungs 2 (two) times a day.  10/17/13   Tanda Rockers, MD  buPROPion (WELLBUTRIN SR) 150 MG 12 hr tablet Take 150 mg by mouth 2 (two) times daily.    [provider]  Cholecalciferol (VITAMIN D) 125 MCG (5000 UT) CAPS Take 5,000 Units by mouth daily.     [provider]  doxazosin (CARDURA) 4 MG tablet Take 4 mg by mouth at bedtime.    [provider]  dutasteride (AVODART) 0.5 MG capsule Take 0.5 mg by mouth at bedtime.     [provider]  EPIPEN 2-PAK 0.3 MG/0.3ML SOAJ injection Inject 0.3 mg into the muscle as needed for anaphylaxis.     [provider]  fluticasone (FLONASE) 50 MCG/ACT nasal spray Place 1 spray into both nostrils 2 (two) times daily.     [provider]  montelukast (SINGULAIR) 10 MG tablet Take 1 tablet (10 mg total) by mouth at bedtime. 02/11/13 04/26/19  Tanda Rockers, MD  olmesartan-hydrochlorothiazide (BENICAR HCT) 20-12.5 MG tablet Take 0.5 tablets by mouth daily.    [provider]  omeprazole (PRILOSEC) 20 MG capsule Take 20 mg by mouth daily.     Tanda Rockers, MD  ondansetron (ZOFRAN) 4 MG tablet Take 1 tablet (4 mg total) by mouth every 6 (six) hours. Patient taking differently: Take 4 mg  by mouth every 8 (eight) hours as needed for nausea or vomiting.  02/11/19   Virgel Manifold, MD  polyethylene glycol (MIRALAX / GLYCOLAX) 17 g packet 17grams in 6 oz of something to drink twice a day until bowel movement.  LAXITIVE.  Restart if two days since last bowel movement Patient taking differently: Take 17 g by mouth every 3 (three) days.  01/15/19   Shepperson, Kirstin, PA-C  simvastatin (ZOCOR) 40 MG tablet Take 40 mg by mouth daily.     [provider]  traMADol (ULTRAM) 50 MG tablet Take 1-2 tablets (50-100 mg total) by mouth every 6 (six) hours as needed for moderate pain. 04/25/19  Ardis Hughs, MD    Allergies    Bee venom  Review of Systems   Review of Systems  Musculoskeletal:        Right ankle pain  All other systems reviewed and are negative.  Physical Exam Updated Vital Signs BP 140/85 (BP Location: Right Arm)   Pulse 82   Temp 98.1 F (36.7 C)   Resp 18   Ht '5\' 10"'$  (1.778 m)   Wt 99.8 kg   SpO2 98%   BMI 31.57 kg/m   Physical Exam Vitals and nursing note reviewed.  Constitutional:      Appearance: Normal appearance.  HENT:     Head: Normocephalic and atraumatic.  Eyes:     Conjunctiva/sclera: Conjunctivae normal.  Pulmonary:     Effort: Pulmonary effort is normal. No respiratory distress.  Musculoskeletal:     Right ankle: Swelling present. No deformity or ecchymosis. Normal range of motion.     Right Achilles Tendon: Normal.     Left ankle: Normal.     Comments: Mild swelling noted to right ankle. Full ROM with mild pain. Pulses in tact bilaterally. No tenderness to palpation or deformities of right achilles tendon.   Skin:    General: Skin is warm and dry.  Neurological:     Mental Status: He is alert.  Psychiatric:        Mood and Affect: Mood normal.        Behavior: Behavior normal.    ED Results / Procedures / Treatments   Labs (all labs ordered are listed, but only abnormal results are displayed) Labs Reviewed - No  data to display  EKG None  Radiology DG Ankle Complete Right  Result Date: 03/04/2021 CLINICAL DATA:  Pain while playing golf EXAM: RIGHT ANKLE - COMPLETE 3+ VIEW COMPARISON:  None. FINDINGS: There is no acute fracture or dislocation. Ankle alignment is normal. The ankle mortise is symmetrically intact. There is mild inferior calcaneal spurring. There is mild degenerative change about the midfoot. The soft tissues are unremarkable.  There is no effusion. IMPRESSION: No acute fracture or dislocation. Electronically Signed   By: Valetta Mole M.D.   On: 03/04/2021 15:15    Procedures Procedures   Medications Ordered in ED Medications - No data to display  ED Course  I have reviewed the triage vital signs and the nursing notes.  Pertinent labs & imaging results that were available during my care of the patient were reviewed by me and considered in my medical decision making (see chart for details).    MDM Rules/Calculators/A&P                           Patient is 70 y/o male who presents with right ankle pain after rolling his ankle playing golf today.  On exam there is mild swelling to the right ankle with normal ROM and normal pulses. No tenderness over achilles tendon or defects.   XR of right ankle shows no acute fractures or dislocations. Patient's symptoms are likely due to right ankle sprain. On reevaluation, patient states his pain has improved. He is stable for discharge. Discussed RICE method and taking otc meds for pain. Patient agreeable to plan.  Final Clinical Impression(s) / ED Diagnoses Final diagnoses:  Acute right ankle pain    Rx / DC Orders ED Discharge Orders     None        Shabre Kreher T, PA-C 03/04/21 1656  Teressa Lower, MD 03/08/21 [phone]

## 2021-03-04 NOTE — Discharge Instructions (Addendum)
The x-ray of your ankle showed no evidence of fractures or dislocations. I think you likely sprained your ankle.  I've attached some instructions for caring for your ankle including compression, ice, and elevation. You can take over the counter medicines such as tylenol or ibuprofen for pain.   Return to ED for new or worsening pain or difficulties walking.

## 2021-03-09 DIAGNOSIS — S93331A Other subluxation of right foot, initial encounter: Secondary | ICD-10-CM | POA: Diagnosis not present

## 2021-03-11 DIAGNOSIS — E538 Deficiency of other specified B group vitamins: Secondary | ICD-10-CM | POA: Diagnosis not present

## 2021-03-29 DIAGNOSIS — Z20828 Contact with and (suspected) exposure to other viral communicable diseases: Secondary | ICD-10-CM | POA: Diagnosis not present

## 2021-04-05 DIAGNOSIS — S93331D Other subluxation of right foot, subsequent encounter: Secondary | ICD-10-CM | POA: Diagnosis not present

## 2021-04-12 DIAGNOSIS — E538 Deficiency of other specified B group vitamins: Secondary | ICD-10-CM | POA: Diagnosis not present

## 2021-04-20 DIAGNOSIS — S93331D Other subluxation of right foot, subsequent encounter: Secondary | ICD-10-CM | POA: Diagnosis not present

## 2021-04-26 DIAGNOSIS — S86391D Other injury of muscle(s) and tendon(s) of peroneal muscle group at lower leg level, right leg, subsequent encounter: Secondary | ICD-10-CM | POA: Diagnosis not present

## 2021-04-29 DIAGNOSIS — S86391D Other injury of muscle(s) and tendon(s) of peroneal muscle group at lower leg level, right leg, subsequent encounter: Secondary | ICD-10-CM | POA: Diagnosis not present

## 2021-05-03 DIAGNOSIS — S86391D Other injury of muscle(s) and tendon(s) of peroneal muscle group at lower leg level, right leg, subsequent encounter: Secondary | ICD-10-CM | POA: Diagnosis not present

## 2021-05-06 DIAGNOSIS — S86391D Other injury of muscle(s) and tendon(s) of peroneal muscle group at lower leg level, right leg, subsequent encounter: Secondary | ICD-10-CM | POA: Diagnosis not present

## 2021-05-10 DIAGNOSIS — S86391D Other injury of muscle(s) and tendon(s) of peroneal muscle group at lower leg level, right leg, subsequent encounter: Secondary | ICD-10-CM | POA: Diagnosis not present

## 2021-05-11 DIAGNOSIS — S93331D Other subluxation of right foot, subsequent encounter: Secondary | ICD-10-CM | POA: Diagnosis not present

## 2021-05-13 DIAGNOSIS — E538 Deficiency of other specified B group vitamins: Secondary | ICD-10-CM | POA: Diagnosis not present

## 2021-05-13 DIAGNOSIS — S86391D Other injury of muscle(s) and tendon(s) of peroneal muscle group at lower leg level, right leg, subsequent encounter: Secondary | ICD-10-CM | POA: Diagnosis not present

## 2021-05-15 DIAGNOSIS — R059 Cough, unspecified: Secondary | ICD-10-CM | POA: Diagnosis not present

## 2021-05-15 DIAGNOSIS — E119 Type 2 diabetes mellitus without complications: Secondary | ICD-10-CM | POA: Diagnosis not present

## 2021-05-15 DIAGNOSIS — J0101 Acute recurrent maxillary sinusitis: Secondary | ICD-10-CM | POA: Diagnosis not present

## 2021-05-17 DIAGNOSIS — S86391D Other injury of muscle(s) and tendon(s) of peroneal muscle group at lower leg level, right leg, subsequent encounter: Secondary | ICD-10-CM | POA: Diagnosis not present

## 2021-05-20 DIAGNOSIS — E1165 Type 2 diabetes mellitus with hyperglycemia: Secondary | ICD-10-CM | POA: Diagnosis not present

## 2021-05-20 DIAGNOSIS — S86391D Other injury of muscle(s) and tendon(s) of peroneal muscle group at lower leg level, right leg, subsequent encounter: Secondary | ICD-10-CM | POA: Diagnosis not present

## 2021-05-20 DIAGNOSIS — K2100 Gastro-esophageal reflux disease with esophagitis, without bleeding: Secondary | ICD-10-CM | POA: Diagnosis not present

## 2021-05-20 DIAGNOSIS — R739 Hyperglycemia, unspecified: Secondary | ICD-10-CM | POA: Diagnosis not present

## 2021-05-20 DIAGNOSIS — E782 Mixed hyperlipidemia: Secondary | ICD-10-CM | POA: Diagnosis not present

## 2021-05-20 DIAGNOSIS — E7849 Other hyperlipidemia: Secondary | ICD-10-CM | POA: Diagnosis not present

## 2021-05-20 DIAGNOSIS — I10 Essential (primary) hypertension: Secondary | ICD-10-CM | POA: Diagnosis not present

## 2021-05-20 DIAGNOSIS — J449 Chronic obstructive pulmonary disease, unspecified: Secondary | ICD-10-CM | POA: Diagnosis not present

## 2021-05-24 DIAGNOSIS — S86391D Other injury of muscle(s) and tendon(s) of peroneal muscle group at lower leg level, right leg, subsequent encounter: Secondary | ICD-10-CM | POA: Diagnosis not present

## 2021-05-25 DIAGNOSIS — E7849 Other hyperlipidemia: Secondary | ICD-10-CM | POA: Diagnosis not present

## 2021-05-25 DIAGNOSIS — J301 Allergic rhinitis due to pollen: Secondary | ICD-10-CM | POA: Diagnosis not present

## 2021-05-25 DIAGNOSIS — F331 Major depressive disorder, recurrent, moderate: Secondary | ICD-10-CM | POA: Diagnosis not present

## 2021-05-25 DIAGNOSIS — Z7189 Other specified counseling: Secondary | ICD-10-CM | POA: Diagnosis not present

## 2021-05-25 DIAGNOSIS — J449 Chronic obstructive pulmonary disease, unspecified: Secondary | ICD-10-CM | POA: Diagnosis not present

## 2021-05-25 DIAGNOSIS — I10 Essential (primary) hypertension: Secondary | ICD-10-CM | POA: Diagnosis not present

## 2021-05-25 DIAGNOSIS — K2100 Gastro-esophageal reflux disease with esophagitis, without bleeding: Secondary | ICD-10-CM | POA: Diagnosis not present

## 2021-05-25 DIAGNOSIS — N400 Enlarged prostate without lower urinary tract symptoms: Secondary | ICD-10-CM | POA: Diagnosis not present

## 2021-05-25 DIAGNOSIS — E119 Type 2 diabetes mellitus without complications: Secondary | ICD-10-CM | POA: Diagnosis not present

## 2021-05-26 DIAGNOSIS — S86391D Other injury of muscle(s) and tendon(s) of peroneal muscle group at lower leg level, right leg, subsequent encounter: Secondary | ICD-10-CM | POA: Diagnosis not present

## 2021-06-02 DIAGNOSIS — S86391D Other injury of muscle(s) and tendon(s) of peroneal muscle group at lower leg level, right leg, subsequent encounter: Secondary | ICD-10-CM | POA: Diagnosis not present

## 2021-06-07 DIAGNOSIS — S86391D Other injury of muscle(s) and tendon(s) of peroneal muscle group at lower leg level, right leg, subsequent encounter: Secondary | ICD-10-CM | POA: Diagnosis not present

## 2021-06-08 DIAGNOSIS — S93331D Other subluxation of right foot, subsequent encounter: Secondary | ICD-10-CM | POA: Diagnosis not present

## 2021-06-14 DIAGNOSIS — E538 Deficiency of other specified B group vitamins: Secondary | ICD-10-CM | POA: Diagnosis not present

## 2021-06-21 DIAGNOSIS — J441 Chronic obstructive pulmonary disease with (acute) exacerbation: Secondary | ICD-10-CM | POA: Diagnosis not present

## 2021-06-21 DIAGNOSIS — Z20828 Contact with and (suspected) exposure to other viral communicable diseases: Secondary | ICD-10-CM | POA: Diagnosis not present

## 2021-07-02 DIAGNOSIS — I10 Essential (primary) hypertension: Secondary | ICD-10-CM | POA: Diagnosis not present

## 2021-07-02 DIAGNOSIS — E7849 Other hyperlipidemia: Secondary | ICD-10-CM | POA: Diagnosis not present

## 2021-07-02 DIAGNOSIS — K219 Gastro-esophageal reflux disease without esophagitis: Secondary | ICD-10-CM | POA: Diagnosis not present

## 2021-07-02 DIAGNOSIS — N12 Tubulo-interstitial nephritis, not specified as acute or chronic: Secondary | ICD-10-CM | POA: Diagnosis not present

## 2021-07-09 DIAGNOSIS — Z23 Encounter for immunization: Secondary | ICD-10-CM | POA: Diagnosis not present

## 2021-07-12 DIAGNOSIS — E538 Deficiency of other specified B group vitamins: Secondary | ICD-10-CM | POA: Diagnosis not present

## 2021-08-03 IMAGING — CT CT RENAL STONE PROTOCOL
2 of 3 series · 15 of 46 positions shown, 17 images · non-contrast
Comparison: February 11, 2019

CLINICAL DATA: Back pain fever abdominal pain, head percutaneous
nephrolithotomy and stent placement with drain removal

EXAM:
CT ABDOMEN AND PELVIS WITHOUT CONTRAST
TECHNIQUE: Multidetector CT imaging of the abdomen and pelvis was performed
following the standard protocol without IV contrast.

[Series 2: axial st · axial · 0.79mm/px · z∈[+1120,+1526]mm · 12 of 95 slices shown, 14 images]
[im 7/95  soft-tissue]
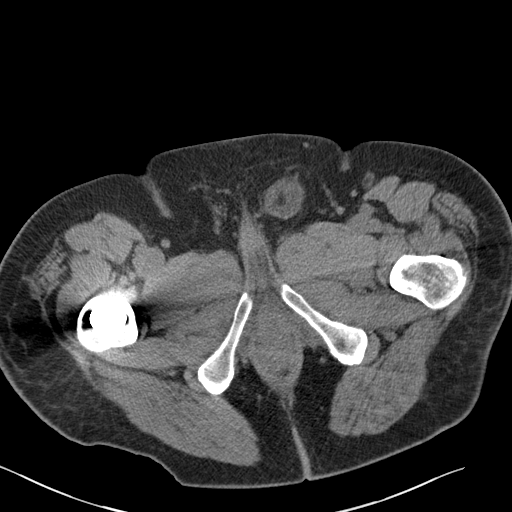
[im 7/95  bone]
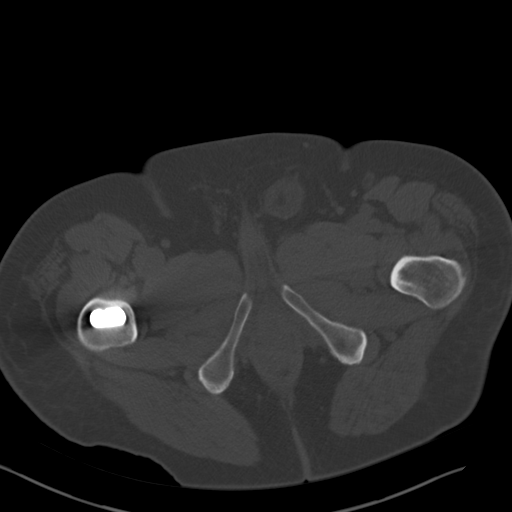
[im 13/95  soft-tissue]
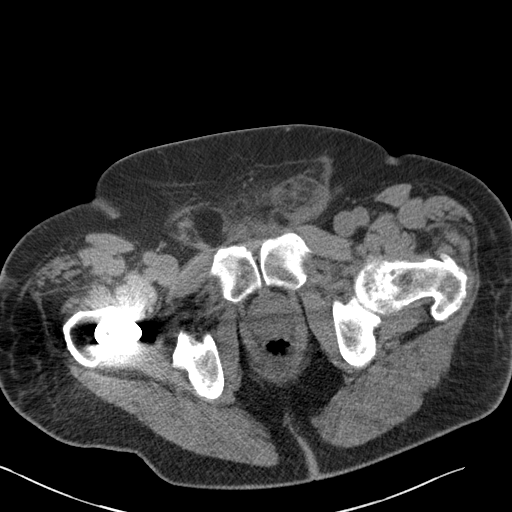
[im 22/95  soft-tissue]
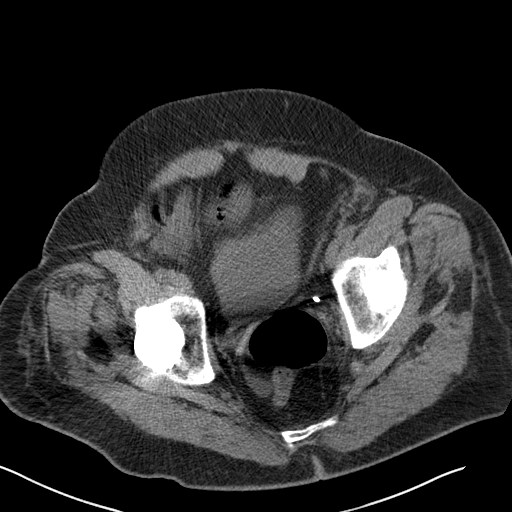
[im 28/95  soft-tissue]
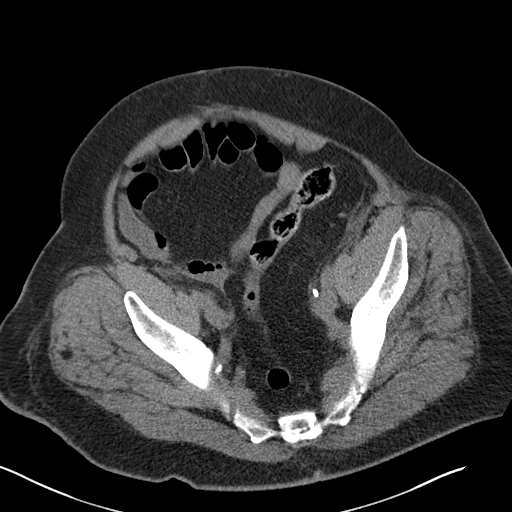
[im 37/95  soft-tissue]
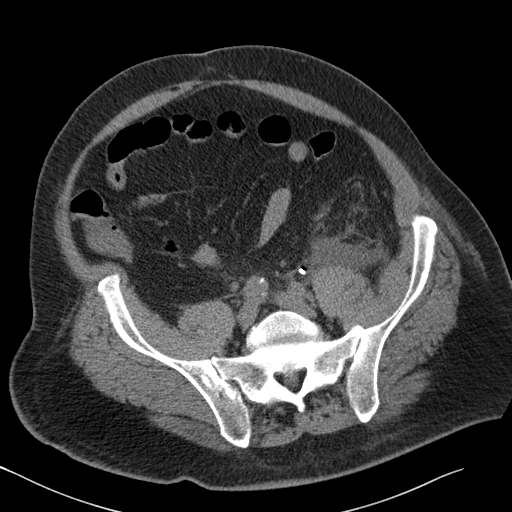
[im 43/95  soft-tissue]
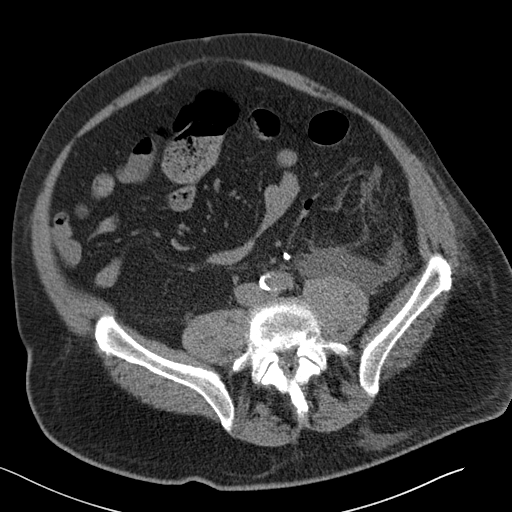
[im 52/95  soft-tissue]
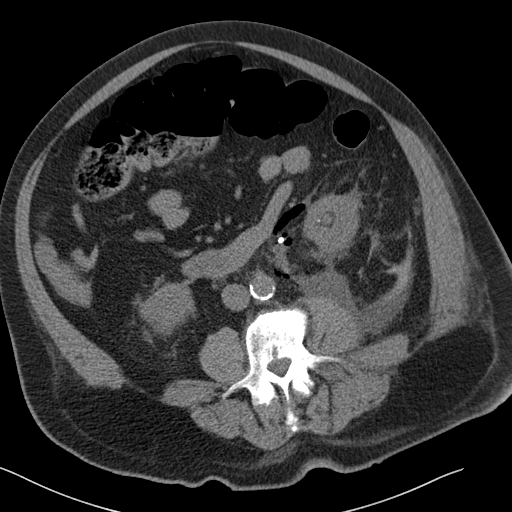
[im 58/95  soft-tissue]
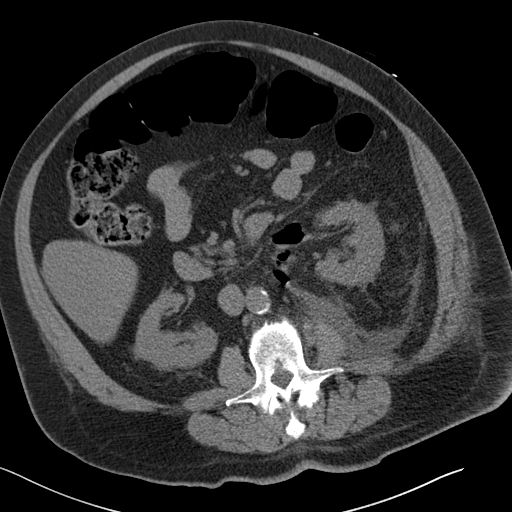
[im 67/95  soft-tissue]
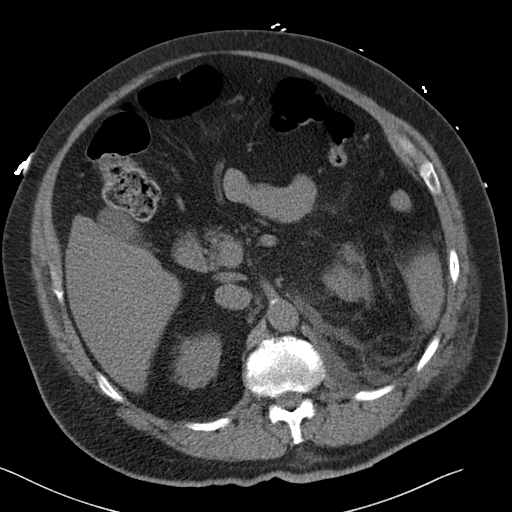
[im 67/95  bone]
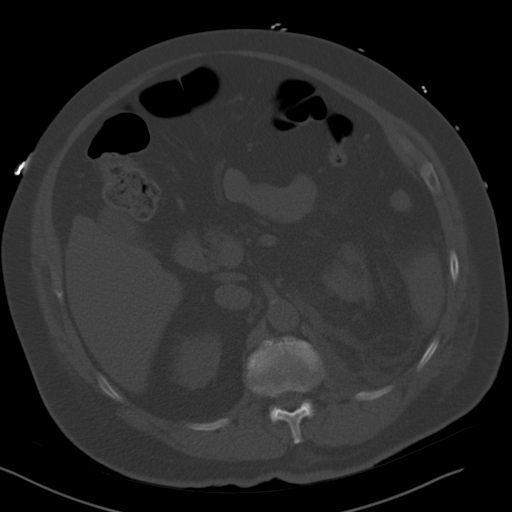
[im 73/95  soft-tissue]
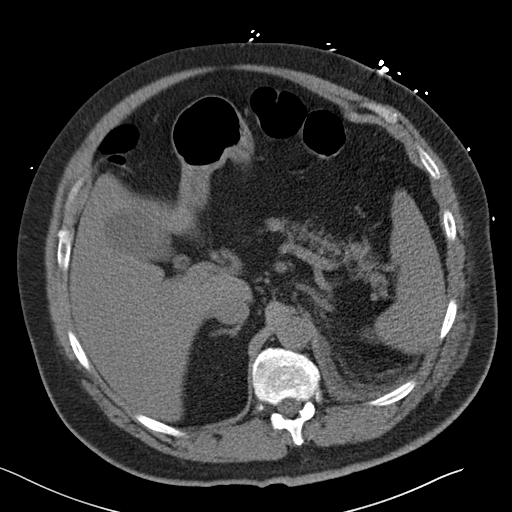
[im 82/95  soft-tissue]
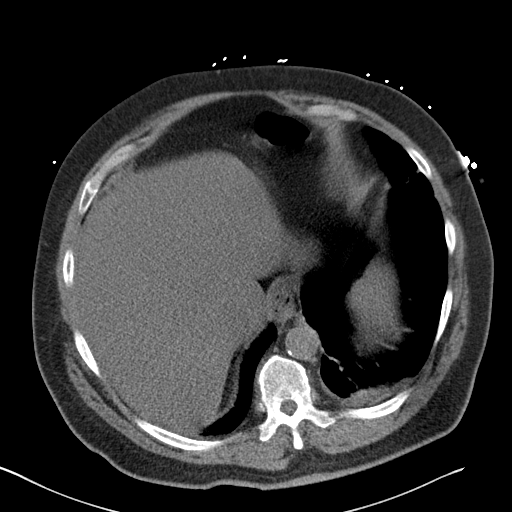
[im 88/95  soft-tissue]
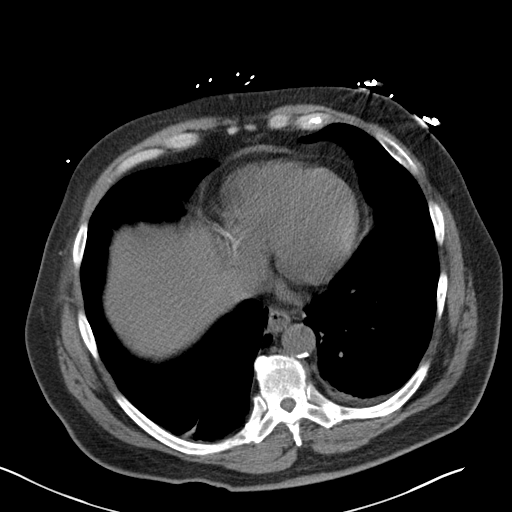

[Series 5: coronal · coronal · 0.79mm/px · 3 of 181 slices shown]
[im 61/181  soft-tissue]
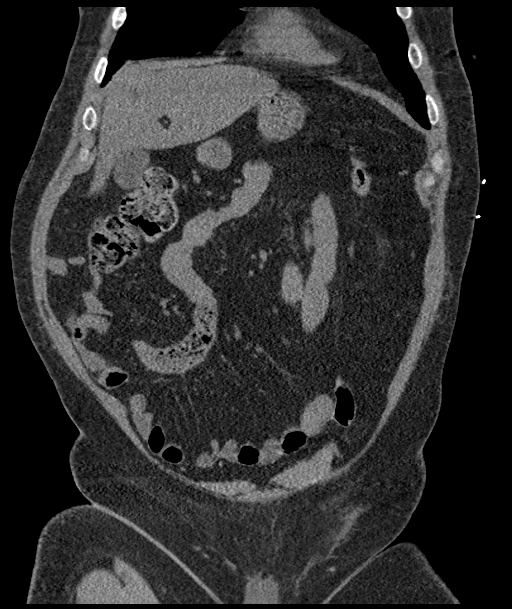
[im 81/181  soft-tissue]
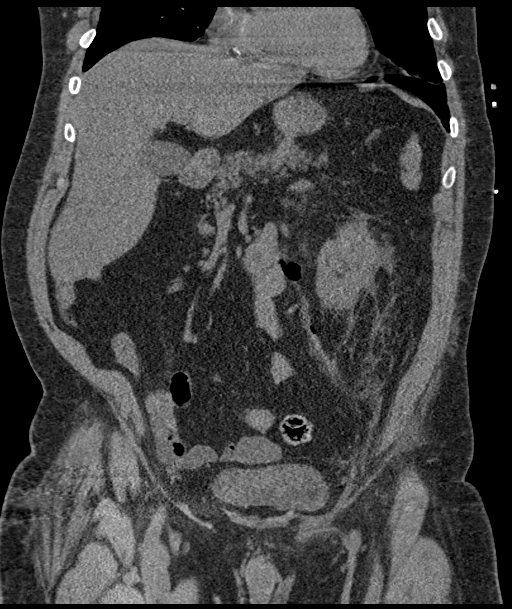
[im 101/181  soft-tissue]
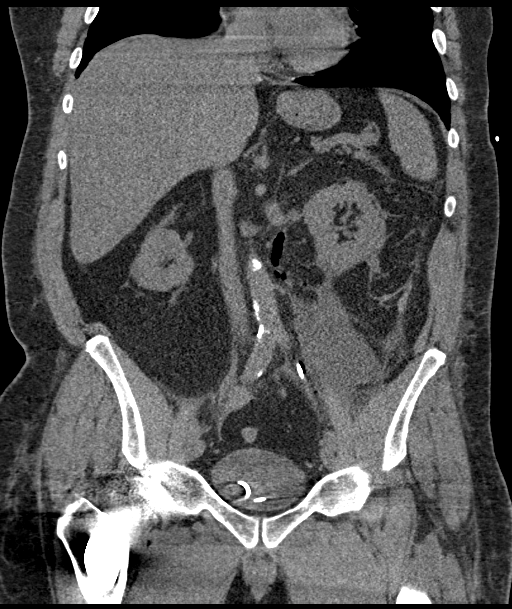

[15 of 46 positions shown; findings below may reference images not displayed]

FINDINGS: Lower chest: The visualized heart size within normal limits. No
pericardial fluid/thickening.

No hiatal hernia.

Streaky atelectasis or scarring at both lung bases.

Hepatobiliary: Although limited due to the lack of intravenous
contrast, normal in appearance without gross focal abnormality. No
evidence of calcified gallstones or biliary ductal dilatation.

Pancreas:  Unremarkable.  No surrounding inflammatory changes.

Spleen: Normal in size. Although limited due to the lack of
intravenous contrast, normal in appearance.

Adrenals/Urinary Tract: Both adrenal glands appear normal. There is
significant amount of left-sided perinephric stranding with fluid
and hyperdense blood products seen extending in the retroperitoneum
and along the psoas. There is free air is seen surrounding the left
kidney and ureteral drain. There is a double-J left-sided ureteral
stent with the tip coiled in the bladder. No hydronephrosis is seen.
There is also mild right perinephric standing with a punctate
calcification in the upper pole of the right kidney. No right-sided
hydronephrosis.

Stomach/Bowel: The stomach, small bowel, and colon are normal in
appearance. No inflammatory changes or obstructive findings.
appendix is normal.

Vascular/Lymphatic: There are no enlarged abdominal or pelvic lymph
nodes. Scattered aortic atherosclerotic calcifications are seen
without aneurysmal dilatation.

Reproductive: The prostate is unremarkable.

Other: Small fat containing bilateral inguinal hernias are seen.
Small amount of fluid seen within the left inguinal hernia. There
are fat stranding changes seen along the left anterior subcutaneous
soft tissues. No loculated fluid collection however is noted.

Musculoskeletal: No acute or significant osseous findings.
IMPRESSION: 1. Significant left perinephric stranding with fluid and blood
products extending in the retroperitoneum ,anterior to the psoas
musculature into the pelvis. This could be due to changes from
posttraumatic recent drain removal, however cannot exclude active
bleed.
2. There is also a small amount of pneumoperitoneum seen surrounding
the right kidney and proximal ureter.
3. Double-J left ureteral stent with the tip coiled in the posterior
bladder.
4. Mild amount of stranding changes along the posterior left
subcutaneous tissues. No loculated fluid collection.

## 2021-08-09 DIAGNOSIS — S93331D Other subluxation of right foot, subsequent encounter: Secondary | ICD-10-CM | POA: Diagnosis not present

## 2021-08-11 DIAGNOSIS — M7671 Peroneal tendinitis, right leg: Secondary | ICD-10-CM | POA: Diagnosis not present

## 2021-08-11 DIAGNOSIS — M25371 Other instability, right ankle: Secondary | ICD-10-CM | POA: Diagnosis not present

## 2021-08-12 DIAGNOSIS — E538 Deficiency of other specified B group vitamins: Secondary | ICD-10-CM | POA: Diagnosis not present

## 2021-08-16 DIAGNOSIS — M25571 Pain in right ankle and joints of right foot: Secondary | ICD-10-CM | POA: Diagnosis not present

## 2021-08-25 DIAGNOSIS — M25371 Other instability, right ankle: Secondary | ICD-10-CM | POA: Diagnosis not present

## 2021-09-07 DIAGNOSIS — S9301XA Subluxation of right ankle joint, initial encounter: Secondary | ICD-10-CM | POA: Diagnosis not present

## 2021-09-07 DIAGNOSIS — S86391A Other injury of muscle(s) and tendon(s) of peroneal muscle group at lower leg level, right leg, initial encounter: Secondary | ICD-10-CM | POA: Diagnosis not present

## 2021-09-08 DIAGNOSIS — E538 Deficiency of other specified B group vitamins: Secondary | ICD-10-CM | POA: Diagnosis not present

## 2021-09-09 DIAGNOSIS — M62161 Other rupture of muscle (nontraumatic), right lower leg: Secondary | ICD-10-CM | POA: Diagnosis not present

## 2021-09-09 DIAGNOSIS — M76891 Other specified enthesopathies of right lower limb, excluding foot: Secondary | ICD-10-CM | POA: Diagnosis not present

## 2021-09-09 DIAGNOSIS — S86391A Other injury of muscle(s) and tendon(s) of peroneal muscle group at lower leg level, right leg, initial encounter: Secondary | ICD-10-CM | POA: Diagnosis not present

## 2021-09-09 DIAGNOSIS — G8918 Other acute postprocedural pain: Secondary | ICD-10-CM | POA: Diagnosis not present

## 2021-09-09 DIAGNOSIS — S9301XA Subluxation of right ankle joint, initial encounter: Secondary | ICD-10-CM | POA: Diagnosis not present

## 2021-10-11 DIAGNOSIS — I10 Essential (primary) hypertension: Secondary | ICD-10-CM | POA: Diagnosis not present

## 2021-10-11 DIAGNOSIS — E538 Deficiency of other specified B group vitamins: Secondary | ICD-10-CM | POA: Diagnosis not present

## 2021-10-28 DIAGNOSIS — S86391D Other injury of muscle(s) and tendon(s) of peroneal muscle group at lower leg level, right leg, subsequent encounter: Secondary | ICD-10-CM | POA: Diagnosis not present

## 2021-11-02 DIAGNOSIS — S86391D Other injury of muscle(s) and tendon(s) of peroneal muscle group at lower leg level, right leg, subsequent encounter: Secondary | ICD-10-CM | POA: Diagnosis not present

## 2021-11-04 DIAGNOSIS — S86391D Other injury of muscle(s) and tendon(s) of peroneal muscle group at lower leg level, right leg, subsequent encounter: Secondary | ICD-10-CM | POA: Diagnosis not present

## 2021-11-09 DIAGNOSIS — S86391D Other injury of muscle(s) and tendon(s) of peroneal muscle group at lower leg level, right leg, subsequent encounter: Secondary | ICD-10-CM | POA: Diagnosis not present

## 2021-11-10 DIAGNOSIS — E538 Deficiency of other specified B group vitamins: Secondary | ICD-10-CM | POA: Diagnosis not present

## 2021-11-10 DIAGNOSIS — I10 Essential (primary) hypertension: Secondary | ICD-10-CM | POA: Diagnosis not present

## 2021-11-11 DIAGNOSIS — E039 Hypothyroidism, unspecified: Secondary | ICD-10-CM | POA: Diagnosis not present

## 2021-11-11 DIAGNOSIS — Z125 Encounter for screening for malignant neoplasm of prostate: Secondary | ICD-10-CM | POA: Diagnosis not present

## 2021-11-11 DIAGNOSIS — E1165 Type 2 diabetes mellitus with hyperglycemia: Secondary | ICD-10-CM | POA: Diagnosis not present

## 2021-11-11 DIAGNOSIS — E782 Mixed hyperlipidemia: Secondary | ICD-10-CM | POA: Diagnosis not present

## 2021-11-11 DIAGNOSIS — S86391D Other injury of muscle(s) and tendon(s) of peroneal muscle group at lower leg level, right leg, subsequent encounter: Secondary | ICD-10-CM | POA: Diagnosis not present

## 2021-11-11 DIAGNOSIS — E7849 Other hyperlipidemia: Secondary | ICD-10-CM | POA: Diagnosis not present

## 2021-11-11 DIAGNOSIS — K219 Gastro-esophageal reflux disease without esophagitis: Secondary | ICD-10-CM | POA: Diagnosis not present

## 2021-11-11 DIAGNOSIS — I10 Essential (primary) hypertension: Secondary | ICD-10-CM | POA: Diagnosis not present

## 2021-11-11 DIAGNOSIS — R5383 Other fatigue: Secondary | ICD-10-CM | POA: Diagnosis not present

## 2021-11-15 DIAGNOSIS — E7849 Other hyperlipidemia: Secondary | ICD-10-CM | POA: Diagnosis not present

## 2021-11-15 DIAGNOSIS — J449 Chronic obstructive pulmonary disease, unspecified: Secondary | ICD-10-CM | POA: Diagnosis not present

## 2021-11-15 DIAGNOSIS — K2100 Gastro-esophageal reflux disease with esophagitis, without bleeding: Secondary | ICD-10-CM | POA: Diagnosis not present

## 2021-11-15 DIAGNOSIS — J301 Allergic rhinitis due to pollen: Secondary | ICD-10-CM | POA: Diagnosis not present

## 2021-11-15 DIAGNOSIS — F331 Major depressive disorder, recurrent, moderate: Secondary | ICD-10-CM | POA: Diagnosis not present

## 2021-11-15 DIAGNOSIS — I10 Essential (primary) hypertension: Secondary | ICD-10-CM | POA: Diagnosis not present

## 2021-11-15 DIAGNOSIS — N400 Enlarged prostate without lower urinary tract symptoms: Secondary | ICD-10-CM | POA: Diagnosis not present

## 2021-11-16 DIAGNOSIS — S86391D Other injury of muscle(s) and tendon(s) of peroneal muscle group at lower leg level, right leg, subsequent encounter: Secondary | ICD-10-CM | POA: Diagnosis not present

## 2021-11-18 DIAGNOSIS — S86391D Other injury of muscle(s) and tendon(s) of peroneal muscle group at lower leg level, right leg, subsequent encounter: Secondary | ICD-10-CM | POA: Diagnosis not present

## 2021-11-22 DIAGNOSIS — S86391D Other injury of muscle(s) and tendon(s) of peroneal muscle group at lower leg level, right leg, subsequent encounter: Secondary | ICD-10-CM | POA: Diagnosis not present

## 2021-11-25 DIAGNOSIS — S86391D Other injury of muscle(s) and tendon(s) of peroneal muscle group at lower leg level, right leg, subsequent encounter: Secondary | ICD-10-CM | POA: Diagnosis not present

## 2021-11-30 DIAGNOSIS — S86391D Other injury of muscle(s) and tendon(s) of peroneal muscle group at lower leg level, right leg, subsequent encounter: Secondary | ICD-10-CM | POA: Diagnosis not present

## 2021-12-01 DIAGNOSIS — S86391D Other injury of muscle(s) and tendon(s) of peroneal muscle group at lower leg level, right leg, subsequent encounter: Secondary | ICD-10-CM | POA: Diagnosis not present

## 2021-12-01 DIAGNOSIS — Z9889 Other specified postprocedural states: Secondary | ICD-10-CM | POA: Diagnosis not present

## 2021-12-08 DIAGNOSIS — E538 Deficiency of other specified B group vitamins: Secondary | ICD-10-CM | POA: Diagnosis not present

## 2022-01-07 DIAGNOSIS — D519 Vitamin B12 deficiency anemia, unspecified: Secondary | ICD-10-CM | POA: Diagnosis not present

## 2022-01-12 DIAGNOSIS — S86391D Other injury of muscle(s) and tendon(s) of peroneal muscle group at lower leg level, right leg, subsequent encounter: Secondary | ICD-10-CM | POA: Diagnosis not present

## 2022-02-04 DIAGNOSIS — E538 Deficiency of other specified B group vitamins: Secondary | ICD-10-CM | POA: Diagnosis not present

## 2022-02-11 DIAGNOSIS — H40013 Open angle with borderline findings, low risk, bilateral: Secondary | ICD-10-CM | POA: Diagnosis not present

## 2022-02-11 DIAGNOSIS — Z0100 Encounter for examination of eyes and vision without abnormal findings: Secondary | ICD-10-CM | POA: Diagnosis not present

## 2022-02-11 DIAGNOSIS — H524 Presbyopia: Secondary | ICD-10-CM | POA: Diagnosis not present

## 2022-03-04 DIAGNOSIS — R030 Elevated blood-pressure reading, without diagnosis of hypertension: Secondary | ICD-10-CM | POA: Diagnosis not present

## 2022-03-04 DIAGNOSIS — E538 Deficiency of other specified B group vitamins: Secondary | ICD-10-CM | POA: Diagnosis not present

## 2022-03-30 DIAGNOSIS — Z6833 Body mass index (BMI) 33.0-33.9, adult: Secondary | ICD-10-CM | POA: Diagnosis not present

## 2022-03-30 DIAGNOSIS — Z20828 Contact with and (suspected) exposure to other viral communicable diseases: Secondary | ICD-10-CM | POA: Diagnosis not present

## 2022-03-30 DIAGNOSIS — R059 Cough, unspecified: Secondary | ICD-10-CM | POA: Diagnosis not present

## 2022-04-04 DIAGNOSIS — E538 Deficiency of other specified B group vitamins: Secondary | ICD-10-CM | POA: Diagnosis not present

## 2022-05-05 DIAGNOSIS — E119 Type 2 diabetes mellitus without complications: Secondary | ICD-10-CM | POA: Diagnosis not present

## 2022-05-05 DIAGNOSIS — R030 Elevated blood-pressure reading, without diagnosis of hypertension: Secondary | ICD-10-CM | POA: Diagnosis not present

## 2022-05-05 DIAGNOSIS — Z20828 Contact with and (suspected) exposure to other viral communicable diseases: Secondary | ICD-10-CM | POA: Diagnosis not present

## 2022-05-05 DIAGNOSIS — E538 Deficiency of other specified B group vitamins: Secondary | ICD-10-CM | POA: Diagnosis not present

## 2022-05-05 DIAGNOSIS — R059 Cough, unspecified: Secondary | ICD-10-CM | POA: Diagnosis not present

## 2022-05-05 DIAGNOSIS — Z6832 Body mass index (BMI) 32.0-32.9, adult: Secondary | ICD-10-CM | POA: Diagnosis not present

## 2022-05-05 DIAGNOSIS — J329 Chronic sinusitis, unspecified: Secondary | ICD-10-CM | POA: Diagnosis not present

## 2022-05-23 DIAGNOSIS — J449 Chronic obstructive pulmonary disease, unspecified: Secondary | ICD-10-CM | POA: Diagnosis not present

## 2022-05-23 DIAGNOSIS — E782 Mixed hyperlipidemia: Secondary | ICD-10-CM | POA: Diagnosis not present

## 2022-05-23 DIAGNOSIS — K2100 Gastro-esophageal reflux disease with esophagitis, without bleeding: Secondary | ICD-10-CM | POA: Diagnosis not present

## 2022-05-23 DIAGNOSIS — I10 Essential (primary) hypertension: Secondary | ICD-10-CM | POA: Diagnosis not present

## 2022-05-23 DIAGNOSIS — E7849 Other hyperlipidemia: Secondary | ICD-10-CM | POA: Diagnosis not present

## 2022-05-23 DIAGNOSIS — E1165 Type 2 diabetes mellitus with hyperglycemia: Secondary | ICD-10-CM | POA: Diagnosis not present

## 2022-05-23 DIAGNOSIS — E039 Hypothyroidism, unspecified: Secondary | ICD-10-CM | POA: Diagnosis not present

## 2022-05-25 DIAGNOSIS — E1165 Type 2 diabetes mellitus with hyperglycemia: Secondary | ICD-10-CM | POA: Diagnosis not present

## 2022-05-25 DIAGNOSIS — E7849 Other hyperlipidemia: Secondary | ICD-10-CM | POA: Diagnosis not present

## 2022-05-25 DIAGNOSIS — J449 Chronic obstructive pulmonary disease, unspecified: Secondary | ICD-10-CM | POA: Diagnosis not present

## 2022-05-25 DIAGNOSIS — I10 Essential (primary) hypertension: Secondary | ICD-10-CM | POA: Diagnosis not present

## 2022-05-25 DIAGNOSIS — J301 Allergic rhinitis due to pollen: Secondary | ICD-10-CM | POA: Diagnosis not present

## 2022-05-25 DIAGNOSIS — Z0001 Encounter for general adult medical examination with abnormal findings: Secondary | ICD-10-CM | POA: Diagnosis not present

## 2022-05-25 DIAGNOSIS — N400 Enlarged prostate without lower urinary tract symptoms: Secondary | ICD-10-CM | POA: Diagnosis not present

## 2022-05-25 DIAGNOSIS — K2100 Gastro-esophageal reflux disease with esophagitis, without bleeding: Secondary | ICD-10-CM | POA: Diagnosis not present

## 2022-05-25 DIAGNOSIS — F331 Major depressive disorder, recurrent, moderate: Secondary | ICD-10-CM | POA: Diagnosis not present

## 2022-06-03 DIAGNOSIS — E538 Deficiency of other specified B group vitamins: Secondary | ICD-10-CM | POA: Diagnosis not present

## 2022-06-03 DIAGNOSIS — R030 Elevated blood-pressure reading, without diagnosis of hypertension: Secondary | ICD-10-CM | POA: Diagnosis not present

## 2022-07-05 DIAGNOSIS — E538 Deficiency of other specified B group vitamins: Secondary | ICD-10-CM | POA: Diagnosis not present

## 2022-07-05 DIAGNOSIS — R030 Elevated blood-pressure reading, without diagnosis of hypertension: Secondary | ICD-10-CM | POA: Diagnosis not present

## 2022-08-05 DIAGNOSIS — E538 Deficiency of other specified B group vitamins: Secondary | ICD-10-CM | POA: Diagnosis not present

## 2022-08-05 DIAGNOSIS — R030 Elevated blood-pressure reading, without diagnosis of hypertension: Secondary | ICD-10-CM | POA: Diagnosis not present

## 2022-09-05 DIAGNOSIS — D519 Vitamin B12 deficiency anemia, unspecified: Secondary | ICD-10-CM | POA: Diagnosis not present

## 2022-09-05 DIAGNOSIS — R030 Elevated blood-pressure reading, without diagnosis of hypertension: Secondary | ICD-10-CM | POA: Diagnosis not present

## 2022-09-13 DIAGNOSIS — R030 Elevated blood-pressure reading, without diagnosis of hypertension: Secondary | ICD-10-CM | POA: Diagnosis not present

## 2022-09-13 DIAGNOSIS — K141 Geographic tongue: Secondary | ICD-10-CM | POA: Diagnosis not present

## 2022-09-13 DIAGNOSIS — Z6833 Body mass index (BMI) 33.0-33.9, adult: Secondary | ICD-10-CM | POA: Diagnosis not present

## 2022-10-06 DIAGNOSIS — E538 Deficiency of other specified B group vitamins: Secondary | ICD-10-CM | POA: Diagnosis not present

## 2022-10-06 DIAGNOSIS — R030 Elevated blood-pressure reading, without diagnosis of hypertension: Secondary | ICD-10-CM | POA: Diagnosis not present

## 2022-11-04 DIAGNOSIS — R030 Elevated blood-pressure reading, without diagnosis of hypertension: Secondary | ICD-10-CM | POA: Diagnosis not present

## 2022-11-04 DIAGNOSIS — E538 Deficiency of other specified B group vitamins: Secondary | ICD-10-CM | POA: Diagnosis not present

## 2022-11-04 DIAGNOSIS — Z0100 Encounter for examination of eyes and vision without abnormal findings: Secondary | ICD-10-CM | POA: Diagnosis not present

## 2022-11-16 DIAGNOSIS — I10 Essential (primary) hypertension: Secondary | ICD-10-CM | POA: Diagnosis not present

## 2022-11-16 DIAGNOSIS — E1165 Type 2 diabetes mellitus with hyperglycemia: Secondary | ICD-10-CM | POA: Diagnosis not present

## 2022-11-16 DIAGNOSIS — K219 Gastro-esophageal reflux disease without esophagitis: Secondary | ICD-10-CM | POA: Diagnosis not present

## 2022-11-16 DIAGNOSIS — E7849 Other hyperlipidemia: Secondary | ICD-10-CM | POA: Diagnosis not present

## 2022-11-23 DIAGNOSIS — I10 Essential (primary) hypertension: Secondary | ICD-10-CM | POA: Diagnosis not present

## 2022-11-23 DIAGNOSIS — J449 Chronic obstructive pulmonary disease, unspecified: Secondary | ICD-10-CM | POA: Diagnosis not present

## 2022-11-23 DIAGNOSIS — E7849 Other hyperlipidemia: Secondary | ICD-10-CM | POA: Diagnosis not present

## 2022-11-23 DIAGNOSIS — J301 Allergic rhinitis due to pollen: Secondary | ICD-10-CM | POA: Diagnosis not present

## 2022-11-23 DIAGNOSIS — K2100 Gastro-esophageal reflux disease with esophagitis, without bleeding: Secondary | ICD-10-CM | POA: Diagnosis not present

## 2022-11-23 DIAGNOSIS — N400 Enlarged prostate without lower urinary tract symptoms: Secondary | ICD-10-CM | POA: Diagnosis not present

## 2022-11-23 DIAGNOSIS — F331 Major depressive disorder, recurrent, moderate: Secondary | ICD-10-CM | POA: Diagnosis not present

## 2022-11-23 DIAGNOSIS — Z6832 Body mass index (BMI) 32.0-32.9, adult: Secondary | ICD-10-CM | POA: Diagnosis not present

## 2022-12-05 DIAGNOSIS — E538 Deficiency of other specified B group vitamins: Secondary | ICD-10-CM | POA: Diagnosis not present

## 2022-12-05 DIAGNOSIS — R030 Elevated blood-pressure reading, without diagnosis of hypertension: Secondary | ICD-10-CM | POA: Diagnosis not present

## 2023-01-04 DIAGNOSIS — E538 Deficiency of other specified B group vitamins: Secondary | ICD-10-CM | POA: Diagnosis not present

## 2023-01-04 DIAGNOSIS — R030 Elevated blood-pressure reading, without diagnosis of hypertension: Secondary | ICD-10-CM | POA: Diagnosis not present

## 2023-02-06 DIAGNOSIS — R030 Elevated blood-pressure reading, without diagnosis of hypertension: Secondary | ICD-10-CM | POA: Diagnosis not present

## 2023-02-06 DIAGNOSIS — E538 Deficiency of other specified B group vitamins: Secondary | ICD-10-CM | POA: Diagnosis not present

## 2023-02-27 DIAGNOSIS — B353 Tinea pedis: Secondary | ICD-10-CM | POA: Diagnosis not present

## 2023-02-27 DIAGNOSIS — M2011 Hallux valgus (acquired), right foot: Secondary | ICD-10-CM | POA: Diagnosis not present

## 2023-02-27 DIAGNOSIS — M792 Neuralgia and neuritis, unspecified: Secondary | ICD-10-CM | POA: Diagnosis not present

## 2023-02-27 DIAGNOSIS — I70203 Unspecified atherosclerosis of native arteries of extremities, bilateral legs: Secondary | ICD-10-CM | POA: Diagnosis not present

## 2023-02-27 DIAGNOSIS — M2012 Hallux valgus (acquired), left foot: Secondary | ICD-10-CM | POA: Diagnosis not present

## 2023-02-27 DIAGNOSIS — M2042 Other hammer toe(s) (acquired), left foot: Secondary | ICD-10-CM | POA: Diagnosis not present

## 2023-02-27 DIAGNOSIS — M2041 Other hammer toe(s) (acquired), right foot: Secondary | ICD-10-CM | POA: Diagnosis not present

## 2023-02-27 DIAGNOSIS — G5761 Lesion of plantar nerve, right lower limb: Secondary | ICD-10-CM | POA: Diagnosis not present

## 2023-02-27 DIAGNOSIS — B351 Tinea unguium: Secondary | ICD-10-CM | POA: Diagnosis not present

## 2023-03-13 DIAGNOSIS — I70203 Unspecified atherosclerosis of native arteries of extremities, bilateral legs: Secondary | ICD-10-CM | POA: Diagnosis not present

## 2023-03-13 DIAGNOSIS — G5761 Lesion of plantar nerve, right lower limb: Secondary | ICD-10-CM | POA: Diagnosis not present

## 2023-03-13 DIAGNOSIS — M792 Neuralgia and neuritis, unspecified: Secondary | ICD-10-CM | POA: Diagnosis not present

## 2023-03-13 DIAGNOSIS — M2011 Hallux valgus (acquired), right foot: Secondary | ICD-10-CM | POA: Diagnosis not present

## 2023-03-13 DIAGNOSIS — B351 Tinea unguium: Secondary | ICD-10-CM | POA: Diagnosis not present

## 2023-03-13 DIAGNOSIS — M2042 Other hammer toe(s) (acquired), left foot: Secondary | ICD-10-CM | POA: Diagnosis not present

## 2023-03-13 DIAGNOSIS — R030 Elevated blood-pressure reading, without diagnosis of hypertension: Secondary | ICD-10-CM | POA: Diagnosis not present

## 2023-03-13 DIAGNOSIS — M2041 Other hammer toe(s) (acquired), right foot: Secondary | ICD-10-CM | POA: Diagnosis not present

## 2023-03-13 DIAGNOSIS — M2012 Hallux valgus (acquired), left foot: Secondary | ICD-10-CM | POA: Diagnosis not present

## 2023-03-13 DIAGNOSIS — B353 Tinea pedis: Secondary | ICD-10-CM | POA: Diagnosis not present

## 2023-03-13 DIAGNOSIS — E538 Deficiency of other specified B group vitamins: Secondary | ICD-10-CM | POA: Diagnosis not present

## 2023-03-27 DIAGNOSIS — M2042 Other hammer toe(s) (acquired), left foot: Secondary | ICD-10-CM | POA: Diagnosis not present

## 2023-03-27 DIAGNOSIS — G5761 Lesion of plantar nerve, right lower limb: Secondary | ICD-10-CM | POA: Diagnosis not present

## 2023-03-27 DIAGNOSIS — L600 Ingrowing nail: Secondary | ICD-10-CM | POA: Diagnosis not present

## 2023-03-27 DIAGNOSIS — B353 Tinea pedis: Secondary | ICD-10-CM | POA: Diagnosis not present

## 2023-03-27 DIAGNOSIS — B351 Tinea unguium: Secondary | ICD-10-CM | POA: Diagnosis not present

## 2023-03-27 DIAGNOSIS — M2041 Other hammer toe(s) (acquired), right foot: Secondary | ICD-10-CM | POA: Diagnosis not present

## 2023-03-27 DIAGNOSIS — M2012 Hallux valgus (acquired), left foot: Secondary | ICD-10-CM | POA: Diagnosis not present

## 2023-03-27 DIAGNOSIS — M2011 Hallux valgus (acquired), right foot: Secondary | ICD-10-CM | POA: Diagnosis not present

## 2023-04-04 DIAGNOSIS — J209 Acute bronchitis, unspecified: Secondary | ICD-10-CM | POA: Diagnosis not present

## 2023-04-04 DIAGNOSIS — E119 Type 2 diabetes mellitus without complications: Secondary | ICD-10-CM | POA: Diagnosis not present

## 2023-04-04 DIAGNOSIS — J0101 Acute recurrent maxillary sinusitis: Secondary | ICD-10-CM | POA: Diagnosis not present

## 2023-04-04 DIAGNOSIS — Z6832 Body mass index (BMI) 32.0-32.9, adult: Secondary | ICD-10-CM | POA: Diagnosis not present

## 2023-04-04 DIAGNOSIS — R030 Elevated blood-pressure reading, without diagnosis of hypertension: Secondary | ICD-10-CM | POA: Diagnosis not present

## 2023-04-04 DIAGNOSIS — Z20828 Contact with and (suspected) exposure to other viral communicable diseases: Secondary | ICD-10-CM | POA: Diagnosis not present

## 2023-04-04 DIAGNOSIS — R059 Cough, unspecified: Secondary | ICD-10-CM | POA: Diagnosis not present

## 2023-04-12 DIAGNOSIS — L600 Ingrowing nail: Secondary | ICD-10-CM | POA: Diagnosis not present

## 2023-04-12 DIAGNOSIS — L03031 Cellulitis of right toe: Secondary | ICD-10-CM | POA: Diagnosis not present

## 2023-04-12 DIAGNOSIS — M2041 Other hammer toe(s) (acquired), right foot: Secondary | ICD-10-CM | POA: Diagnosis not present

## 2023-04-12 DIAGNOSIS — M2042 Other hammer toe(s) (acquired), left foot: Secondary | ICD-10-CM | POA: Diagnosis not present

## 2023-04-12 DIAGNOSIS — M2012 Hallux valgus (acquired), left foot: Secondary | ICD-10-CM | POA: Diagnosis not present

## 2023-04-12 DIAGNOSIS — B353 Tinea pedis: Secondary | ICD-10-CM | POA: Diagnosis not present

## 2023-04-12 DIAGNOSIS — G5761 Lesion of plantar nerve, right lower limb: Secondary | ICD-10-CM | POA: Diagnosis not present

## 2023-04-12 DIAGNOSIS — B351 Tinea unguium: Secondary | ICD-10-CM | POA: Diagnosis not present

## 2023-04-12 DIAGNOSIS — M2011 Hallux valgus (acquired), right foot: Secondary | ICD-10-CM | POA: Diagnosis not present

## 2023-04-20 DIAGNOSIS — E538 Deficiency of other specified B group vitamins: Secondary | ICD-10-CM | POA: Diagnosis not present

## 2023-04-25 DIAGNOSIS — L600 Ingrowing nail: Secondary | ICD-10-CM | POA: Diagnosis not present

## 2023-04-25 DIAGNOSIS — L03031 Cellulitis of right toe: Secondary | ICD-10-CM | POA: Diagnosis not present

## 2023-04-25 DIAGNOSIS — G5761 Lesion of plantar nerve, right lower limb: Secondary | ICD-10-CM | POA: Diagnosis not present

## 2023-04-25 DIAGNOSIS — M2041 Other hammer toe(s) (acquired), right foot: Secondary | ICD-10-CM | POA: Diagnosis not present

## 2023-04-25 DIAGNOSIS — M2012 Hallux valgus (acquired), left foot: Secondary | ICD-10-CM | POA: Diagnosis not present

## 2023-04-25 DIAGNOSIS — M2011 Hallux valgus (acquired), right foot: Secondary | ICD-10-CM | POA: Diagnosis not present

## 2023-04-25 DIAGNOSIS — B351 Tinea unguium: Secondary | ICD-10-CM | POA: Diagnosis not present

## 2023-04-25 DIAGNOSIS — M2042 Other hammer toe(s) (acquired), left foot: Secondary | ICD-10-CM | POA: Diagnosis not present

## 2023-04-25 DIAGNOSIS — B353 Tinea pedis: Secondary | ICD-10-CM | POA: Diagnosis not present

## 2023-05-03 DIAGNOSIS — Z23 Encounter for immunization: Secondary | ICD-10-CM | POA: Diagnosis not present

## 2023-05-22 DIAGNOSIS — E538 Deficiency of other specified B group vitamins: Secondary | ICD-10-CM | POA: Diagnosis not present

## 2023-05-22 DIAGNOSIS — R030 Elevated blood-pressure reading, without diagnosis of hypertension: Secondary | ICD-10-CM | POA: Diagnosis not present

## 2023-06-07 DIAGNOSIS — E1165 Type 2 diabetes mellitus with hyperglycemia: Secondary | ICD-10-CM | POA: Diagnosis not present

## 2023-06-07 DIAGNOSIS — I10 Essential (primary) hypertension: Secondary | ICD-10-CM | POA: Diagnosis not present

## 2023-06-07 DIAGNOSIS — E782 Mixed hyperlipidemia: Secondary | ICD-10-CM | POA: Diagnosis not present

## 2023-06-07 DIAGNOSIS — Z125 Encounter for screening for malignant neoplasm of prostate: Secondary | ICD-10-CM | POA: Diagnosis not present

## 2023-06-07 DIAGNOSIS — Z1321 Encounter for screening for nutritional disorder: Secondary | ICD-10-CM | POA: Diagnosis not present

## 2023-06-07 DIAGNOSIS — E039 Hypothyroidism, unspecified: Secondary | ICD-10-CM | POA: Diagnosis not present

## 2023-06-07 DIAGNOSIS — E7849 Other hyperlipidemia: Secondary | ICD-10-CM | POA: Diagnosis not present

## 2023-06-07 DIAGNOSIS — Z0001 Encounter for general adult medical examination with abnormal findings: Secondary | ICD-10-CM | POA: Diagnosis not present

## 2023-06-12 DIAGNOSIS — B351 Tinea unguium: Secondary | ICD-10-CM | POA: Diagnosis not present

## 2023-06-12 DIAGNOSIS — M19071 Primary osteoarthritis, right ankle and foot: Secondary | ICD-10-CM | POA: Diagnosis not present

## 2023-06-12 DIAGNOSIS — G5761 Lesion of plantar nerve, right lower limb: Secondary | ICD-10-CM | POA: Diagnosis not present

## 2023-06-12 DIAGNOSIS — M2042 Other hammer toe(s) (acquired), left foot: Secondary | ICD-10-CM | POA: Diagnosis not present

## 2023-06-12 DIAGNOSIS — M2041 Other hammer toe(s) (acquired), right foot: Secondary | ICD-10-CM | POA: Diagnosis not present

## 2023-06-12 DIAGNOSIS — M2011 Hallux valgus (acquired), right foot: Secondary | ICD-10-CM | POA: Diagnosis not present

## 2023-06-12 DIAGNOSIS — B353 Tinea pedis: Secondary | ICD-10-CM | POA: Diagnosis not present

## 2023-06-12 DIAGNOSIS — M2012 Hallux valgus (acquired), left foot: Secondary | ICD-10-CM | POA: Diagnosis not present

## 2023-06-12 DIAGNOSIS — M19072 Primary osteoarthritis, left ankle and foot: Secondary | ICD-10-CM | POA: Diagnosis not present

## 2023-06-12 IMAGING — DX DG ANKLE COMPLETE 3+V*R*
3 series · 3 of 3 positions shown · non-contrast
Comparison: None.

CLINICAL DATA: Pain while playing golf

EXAM:
RIGHT ANKLE - COMPLETE 3+ VIEW

[ankle ap]
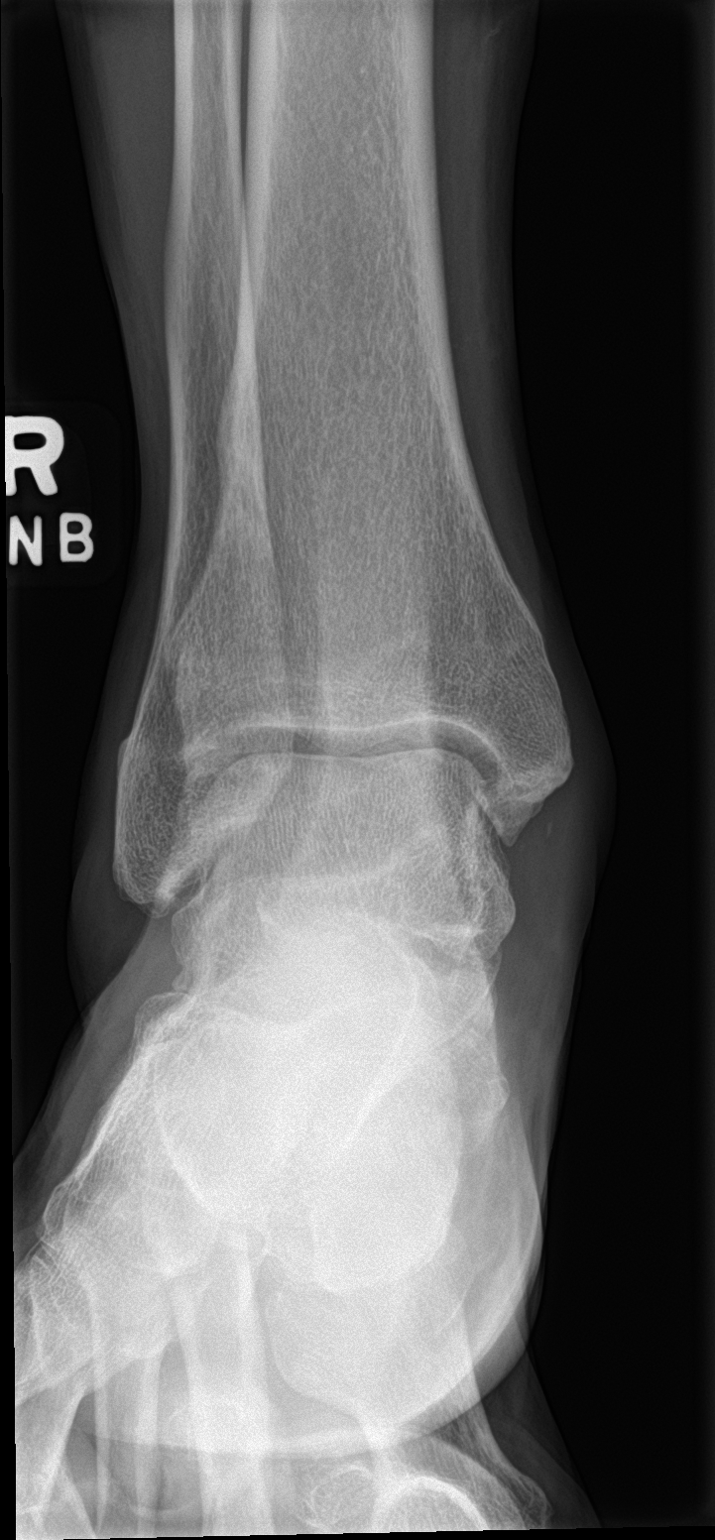

[ankle obl]
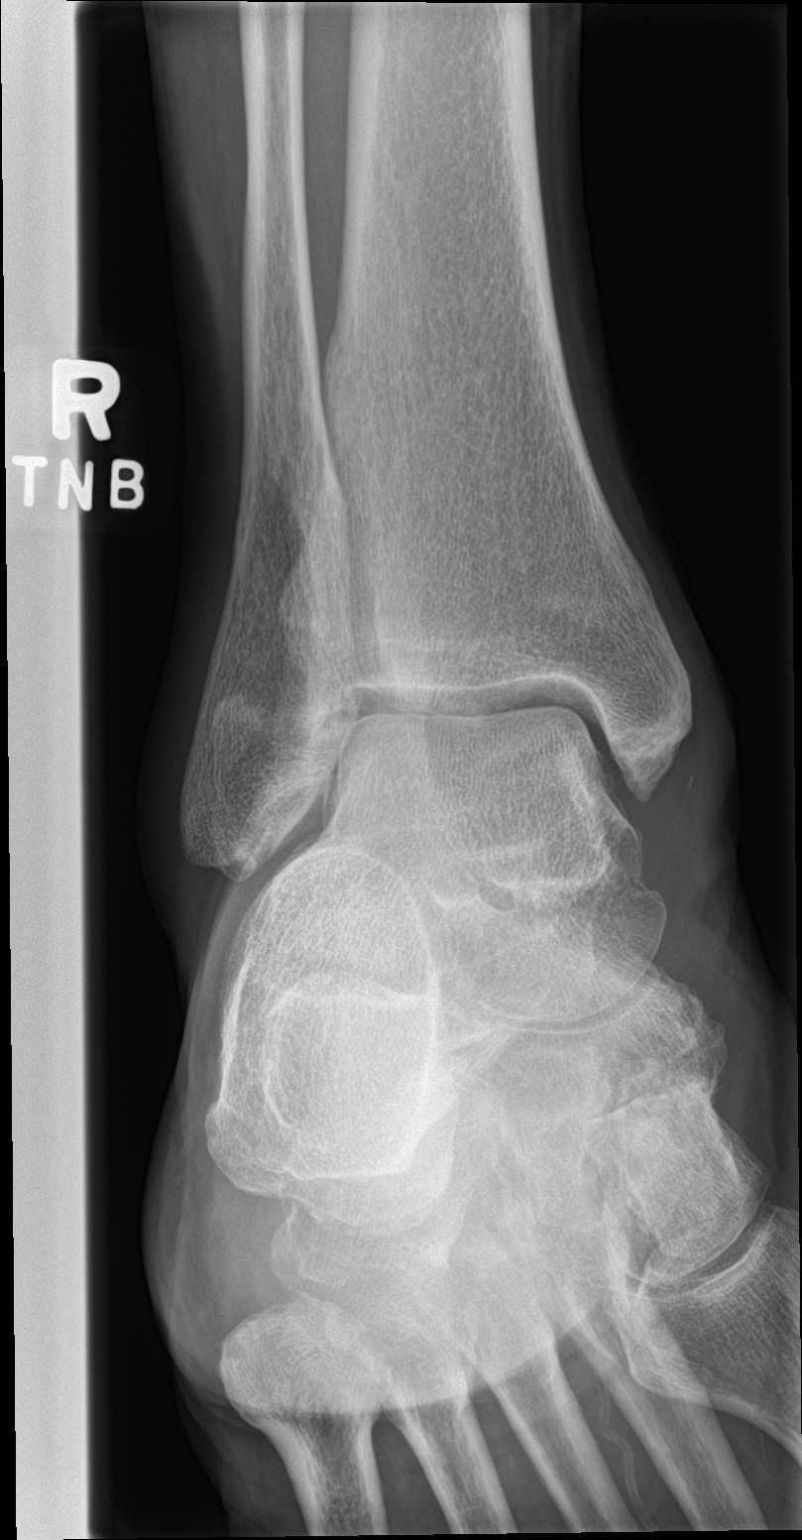

[ankle lat]
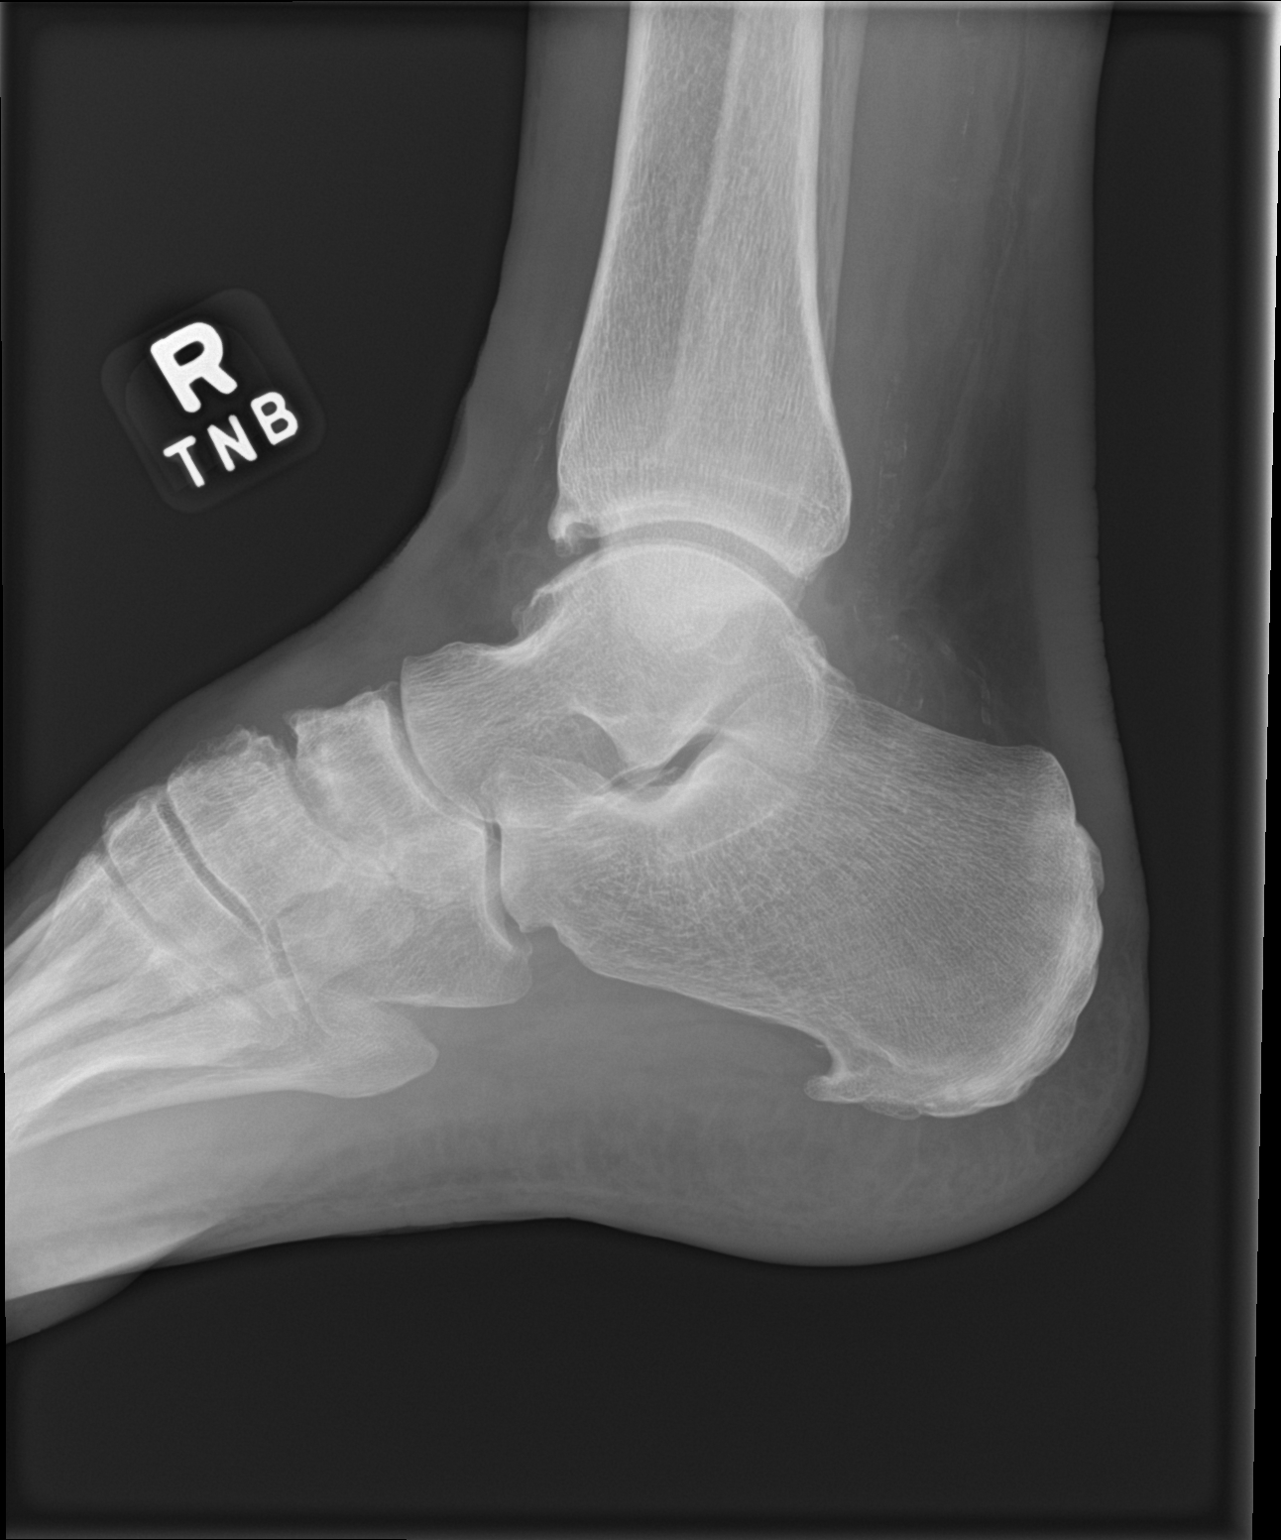

[3 of 3 positions shown; findings below may reference images not displayed]

FINDINGS: There is no acute fracture or dislocation. Ankle alignment is
normal. The ankle mortise is symmetrically intact.

There is mild inferior calcaneal spurring. There is mild
degenerative change about the midfoot.

The soft tissues are unremarkable.  There is no effusion.
IMPRESSION: No acute fracture or dislocation.

## 2023-06-13 DIAGNOSIS — K2100 Gastro-esophageal reflux disease with esophagitis, without bleeding: Secondary | ICD-10-CM | POA: Diagnosis not present

## 2023-06-13 DIAGNOSIS — J449 Chronic obstructive pulmonary disease, unspecified: Secondary | ICD-10-CM | POA: Diagnosis not present

## 2023-06-13 DIAGNOSIS — J301 Allergic rhinitis due to pollen: Secondary | ICD-10-CM | POA: Diagnosis not present

## 2023-06-13 DIAGNOSIS — Z0001 Encounter for general adult medical examination with abnormal findings: Secondary | ICD-10-CM | POA: Diagnosis not present

## 2023-06-13 DIAGNOSIS — I10 Essential (primary) hypertension: Secondary | ICD-10-CM | POA: Diagnosis not present

## 2023-06-13 DIAGNOSIS — F331 Major depressive disorder, recurrent, moderate: Secondary | ICD-10-CM | POA: Diagnosis not present

## 2023-06-13 DIAGNOSIS — E7849 Other hyperlipidemia: Secondary | ICD-10-CM | POA: Diagnosis not present

## 2023-06-13 DIAGNOSIS — Z6834 Body mass index (BMI) 34.0-34.9, adult: Secondary | ICD-10-CM | POA: Diagnosis not present

## 2023-06-13 DIAGNOSIS — N400 Enlarged prostate without lower urinary tract symptoms: Secondary | ICD-10-CM | POA: Diagnosis not present

## 2023-06-26 DIAGNOSIS — E538 Deficiency of other specified B group vitamins: Secondary | ICD-10-CM | POA: Diagnosis not present

## 2023-07-26 DIAGNOSIS — J441 Chronic obstructive pulmonary disease with (acute) exacerbation: Secondary | ICD-10-CM | POA: Diagnosis not present

## 2023-07-26 DIAGNOSIS — I10 Essential (primary) hypertension: Secondary | ICD-10-CM | POA: Diagnosis not present

## 2023-07-27 DIAGNOSIS — E538 Deficiency of other specified B group vitamins: Secondary | ICD-10-CM | POA: Diagnosis not present

## 2023-08-25 DIAGNOSIS — E538 Deficiency of other specified B group vitamins: Secondary | ICD-10-CM | POA: Diagnosis not present

## 2023-09-26 DIAGNOSIS — E538 Deficiency of other specified B group vitamins: Secondary | ICD-10-CM | POA: Diagnosis not present

## 2023-11-06 DIAGNOSIS — E538 Deficiency of other specified B group vitamins: Secondary | ICD-10-CM | POA: Diagnosis not present

## 2023-11-24 DIAGNOSIS — J45909 Unspecified asthma, uncomplicated: Secondary | ICD-10-CM | POA: Diagnosis not present

## 2023-11-24 DIAGNOSIS — K5900 Constipation, unspecified: Secondary | ICD-10-CM | POA: Diagnosis not present

## 2023-11-24 DIAGNOSIS — Z87891 Personal history of nicotine dependence: Secondary | ICD-10-CM | POA: Diagnosis not present

## 2023-11-24 DIAGNOSIS — Z6831 Body mass index (BMI) 31.0-31.9, adult: Secondary | ICD-10-CM | POA: Diagnosis not present

## 2023-11-24 DIAGNOSIS — Z7951 Long term (current) use of inhaled steroids: Secondary | ICD-10-CM | POA: Diagnosis not present

## 2023-11-24 DIAGNOSIS — I10 Essential (primary) hypertension: Secondary | ICD-10-CM | POA: Diagnosis not present

## 2023-11-24 DIAGNOSIS — N400 Enlarged prostate without lower urinary tract symptoms: Secondary | ICD-10-CM | POA: Diagnosis not present

## 2023-11-24 DIAGNOSIS — Z8249 Family history of ischemic heart disease and other diseases of the circulatory system: Secondary | ICD-10-CM | POA: Diagnosis not present

## 2023-11-24 DIAGNOSIS — Z833 Family history of diabetes mellitus: Secondary | ICD-10-CM | POA: Diagnosis not present

## 2023-11-24 DIAGNOSIS — F325 Major depressive disorder, single episode, in full remission: Secondary | ICD-10-CM | POA: Diagnosis not present

## 2023-11-24 DIAGNOSIS — K219 Gastro-esophageal reflux disease without esophagitis: Secondary | ICD-10-CM | POA: Diagnosis not present

## 2023-11-24 DIAGNOSIS — E785 Hyperlipidemia, unspecified: Secondary | ICD-10-CM | POA: Diagnosis not present

## 2023-12-05 DIAGNOSIS — Z0100 Encounter for examination of eyes and vision without abnormal findings: Secondary | ICD-10-CM | POA: Diagnosis not present

## 2023-12-07 DIAGNOSIS — E538 Deficiency of other specified B group vitamins: Secondary | ICD-10-CM | POA: Diagnosis not present

## 2023-12-11 DIAGNOSIS — R5383 Other fatigue: Secondary | ICD-10-CM | POA: Diagnosis not present

## 2023-12-14 DIAGNOSIS — E782 Mixed hyperlipidemia: Secondary | ICD-10-CM | POA: Diagnosis not present

## 2023-12-14 DIAGNOSIS — J449 Chronic obstructive pulmonary disease, unspecified: Secondary | ICD-10-CM | POA: Diagnosis not present

## 2023-12-14 DIAGNOSIS — Z6832 Body mass index (BMI) 32.0-32.9, adult: Secondary | ICD-10-CM | POA: Diagnosis not present

## 2023-12-14 DIAGNOSIS — I10 Essential (primary) hypertension: Secondary | ICD-10-CM | POA: Diagnosis not present

## 2023-12-14 DIAGNOSIS — E538 Deficiency of other specified B group vitamins: Secondary | ICD-10-CM | POA: Diagnosis not present

## 2023-12-26 DIAGNOSIS — M2011 Hallux valgus (acquired), right foot: Secondary | ICD-10-CM | POA: Diagnosis not present

## 2023-12-26 DIAGNOSIS — B353 Tinea pedis: Secondary | ICD-10-CM | POA: Diagnosis not present

## 2023-12-26 DIAGNOSIS — M19072 Primary osteoarthritis, left ankle and foot: Secondary | ICD-10-CM | POA: Diagnosis not present

## 2023-12-26 DIAGNOSIS — M19071 Primary osteoarthritis, right ankle and foot: Secondary | ICD-10-CM | POA: Diagnosis not present

## 2023-12-26 DIAGNOSIS — M2041 Other hammer toe(s) (acquired), right foot: Secondary | ICD-10-CM | POA: Diagnosis not present

## 2023-12-26 DIAGNOSIS — M2042 Other hammer toe(s) (acquired), left foot: Secondary | ICD-10-CM | POA: Diagnosis not present

## 2023-12-26 DIAGNOSIS — M2012 Hallux valgus (acquired), left foot: Secondary | ICD-10-CM | POA: Diagnosis not present

## 2023-12-26 DIAGNOSIS — B351 Tinea unguium: Secondary | ICD-10-CM | POA: Diagnosis not present

## 2023-12-26 DIAGNOSIS — G5761 Lesion of plantar nerve, right lower limb: Secondary | ICD-10-CM | POA: Diagnosis not present

## 2024-01-08 DIAGNOSIS — E538 Deficiency of other specified B group vitamins: Secondary | ICD-10-CM | POA: Diagnosis not present

## 2024-02-08 DIAGNOSIS — E538 Deficiency of other specified B group vitamins: Secondary | ICD-10-CM | POA: Diagnosis not present

## 2024-03-11 DIAGNOSIS — E538 Deficiency of other specified B group vitamins: Secondary | ICD-10-CM | POA: Diagnosis not present

## 2024-03-26 DIAGNOSIS — M25551 Pain in right hip: Secondary | ICD-10-CM | POA: Diagnosis not present

## 2024-03-26 DIAGNOSIS — M545 Low back pain, unspecified: Secondary | ICD-10-CM | POA: Diagnosis not present

## 2024-03-27 DIAGNOSIS — M19072 Primary osteoarthritis, left ankle and foot: Secondary | ICD-10-CM | POA: Diagnosis not present

## 2024-03-27 DIAGNOSIS — M2041 Other hammer toe(s) (acquired), right foot: Secondary | ICD-10-CM | POA: Diagnosis not present

## 2024-03-27 DIAGNOSIS — M2012 Hallux valgus (acquired), left foot: Secondary | ICD-10-CM | POA: Diagnosis not present

## 2024-03-27 DIAGNOSIS — M2042 Other hammer toe(s) (acquired), left foot: Secondary | ICD-10-CM | POA: Diagnosis not present

## 2024-03-27 DIAGNOSIS — M19071 Primary osteoarthritis, right ankle and foot: Secondary | ICD-10-CM | POA: Diagnosis not present

## 2024-03-27 DIAGNOSIS — G5761 Lesion of plantar nerve, right lower limb: Secondary | ICD-10-CM | POA: Diagnosis not present

## 2024-03-27 DIAGNOSIS — B353 Tinea pedis: Secondary | ICD-10-CM | POA: Diagnosis not present

## 2024-03-27 DIAGNOSIS — M2011 Hallux valgus (acquired), right foot: Secondary | ICD-10-CM | POA: Diagnosis not present

## 2024-03-27 DIAGNOSIS — B351 Tinea unguium: Secondary | ICD-10-CM | POA: Diagnosis not present

## 2024-04-05 DIAGNOSIS — H524 Presbyopia: Secondary | ICD-10-CM | POA: Diagnosis not present

## 2024-04-11 DIAGNOSIS — E538 Deficiency of other specified B group vitamins: Secondary | ICD-10-CM | POA: Diagnosis not present

## 2024-04-15 DIAGNOSIS — M19072 Primary osteoarthritis, left ankle and foot: Secondary | ICD-10-CM | POA: Diagnosis not present

## 2024-04-15 DIAGNOSIS — M2041 Other hammer toe(s) (acquired), right foot: Secondary | ICD-10-CM | POA: Diagnosis not present

## 2024-04-15 DIAGNOSIS — M24574 Contracture, right foot: Secondary | ICD-10-CM | POA: Diagnosis not present

## 2024-04-15 DIAGNOSIS — B351 Tinea unguium: Secondary | ICD-10-CM | POA: Diagnosis not present

## 2024-04-15 DIAGNOSIS — M2012 Hallux valgus (acquired), left foot: Secondary | ICD-10-CM | POA: Diagnosis not present

## 2024-04-15 DIAGNOSIS — M2042 Other hammer toe(s) (acquired), left foot: Secondary | ICD-10-CM | POA: Diagnosis not present

## 2024-04-15 DIAGNOSIS — G5761 Lesion of plantar nerve, right lower limb: Secondary | ICD-10-CM | POA: Diagnosis not present

## 2024-04-15 DIAGNOSIS — B353 Tinea pedis: Secondary | ICD-10-CM | POA: Diagnosis not present

## 2024-04-15 DIAGNOSIS — M2011 Hallux valgus (acquired), right foot: Secondary | ICD-10-CM | POA: Diagnosis not present

## 2024-04-30 DIAGNOSIS — I10 Essential (primary) hypertension: Secondary | ICD-10-CM | POA: Diagnosis not present

## 2024-04-30 DIAGNOSIS — K2100 Gastro-esophageal reflux disease with esophagitis, without bleeding: Secondary | ICD-10-CM | POA: Diagnosis not present

## 2024-04-30 DIAGNOSIS — N400 Enlarged prostate without lower urinary tract symptoms: Secondary | ICD-10-CM | POA: Diagnosis not present

## 2024-04-30 DIAGNOSIS — F331 Major depressive disorder, recurrent, moderate: Secondary | ICD-10-CM | POA: Diagnosis not present

## 2024-04-30 DIAGNOSIS — E538 Deficiency of other specified B group vitamins: Secondary | ICD-10-CM | POA: Diagnosis not present

## 2024-04-30 DIAGNOSIS — Z0001 Encounter for general adult medical examination with abnormal findings: Secondary | ICD-10-CM | POA: Diagnosis not present

## 2024-04-30 DIAGNOSIS — J449 Chronic obstructive pulmonary disease, unspecified: Secondary | ICD-10-CM | POA: Diagnosis not present

## 2024-04-30 DIAGNOSIS — E1165 Type 2 diabetes mellitus with hyperglycemia: Secondary | ICD-10-CM | POA: Diagnosis not present

## 2024-04-30 DIAGNOSIS — J301 Allergic rhinitis due to pollen: Secondary | ICD-10-CM | POA: Diagnosis not present

## 2024-04-30 DIAGNOSIS — Z6833 Body mass index (BMI) 33.0-33.9, adult: Secondary | ICD-10-CM | POA: Diagnosis not present

## 2024-04-30 DIAGNOSIS — Z1211 Encounter for screening for malignant neoplasm of colon: Secondary | ICD-10-CM | POA: Diagnosis not present

## 2024-05-07 DIAGNOSIS — Z1212 Encounter for screening for malignant neoplasm of rectum: Secondary | ICD-10-CM | POA: Diagnosis not present

## 2024-05-07 DIAGNOSIS — Z1211 Encounter for screening for malignant neoplasm of colon: Secondary | ICD-10-CM | POA: Diagnosis not present

## 2024-05-14 DIAGNOSIS — E538 Deficiency of other specified B group vitamins: Secondary | ICD-10-CM | POA: Diagnosis not present

## 2024-05-15 DIAGNOSIS — M7061 Trochanteric bursitis, right hip: Secondary | ICD-10-CM | POA: Diagnosis not present

## 2024-05-15 DIAGNOSIS — M5416 Radiculopathy, lumbar region: Secondary | ICD-10-CM | POA: Diagnosis not present

## 2024-05-15 DIAGNOSIS — M5459 Other low back pain: Secondary | ICD-10-CM | POA: Diagnosis not present

## 2024-05-22 ENCOUNTER — Encounter: Payer: Self-pay | Admitting: Gastroenterology

## 2024-05-24 DIAGNOSIS — M545 Low back pain, unspecified: Secondary | ICD-10-CM | POA: Diagnosis not present

## 2024-05-28 DIAGNOSIS — M4726 Other spondylosis with radiculopathy, lumbar region: Secondary | ICD-10-CM | POA: Diagnosis not present

## 2024-06-13 DIAGNOSIS — E538 Deficiency of other specified B group vitamins: Secondary | ICD-10-CM | POA: Diagnosis not present

## 2024-06-14 DIAGNOSIS — M47816 Spondylosis without myelopathy or radiculopathy, lumbar region: Secondary | ICD-10-CM | POA: Diagnosis not present

## 2024-06-19 DIAGNOSIS — M24574 Contracture, right foot: Secondary | ICD-10-CM | POA: Diagnosis not present

## 2024-06-19 DIAGNOSIS — B353 Tinea pedis: Secondary | ICD-10-CM | POA: Diagnosis not present

## 2024-06-19 DIAGNOSIS — M19072 Primary osteoarthritis, left ankle and foot: Secondary | ICD-10-CM | POA: Diagnosis not present

## 2024-06-19 DIAGNOSIS — M2042 Other hammer toe(s) (acquired), left foot: Secondary | ICD-10-CM | POA: Diagnosis not present

## 2024-06-19 DIAGNOSIS — M2012 Hallux valgus (acquired), left foot: Secondary | ICD-10-CM | POA: Diagnosis not present

## 2024-06-19 DIAGNOSIS — M2011 Hallux valgus (acquired), right foot: Secondary | ICD-10-CM | POA: Diagnosis not present

## 2024-06-19 DIAGNOSIS — G5761 Lesion of plantar nerve, right lower limb: Secondary | ICD-10-CM | POA: Diagnosis not present

## 2024-06-19 DIAGNOSIS — M2041 Other hammer toe(s) (acquired), right foot: Secondary | ICD-10-CM | POA: Diagnosis not present

## 2024-06-19 DIAGNOSIS — M19071 Primary osteoarthritis, right ankle and foot: Secondary | ICD-10-CM | POA: Diagnosis not present

## 2024-06-19 DIAGNOSIS — B351 Tinea unguium: Secondary | ICD-10-CM | POA: Diagnosis not present

## 2024-07-08 ENCOUNTER — Other Ambulatory Visit: Payer: Self-pay | Admitting: *Deleted

## 2024-07-08 ENCOUNTER — Encounter: Payer: Self-pay | Admitting: Gastroenterology

## 2024-07-08 ENCOUNTER — Ambulatory Visit: Admitting: Gastroenterology

## 2024-07-08 ENCOUNTER — Encounter: Payer: Self-pay | Admitting: *Deleted

## 2024-07-08 VITALS — BP 125/79 | HR 93 | Temp 98.6°F | Ht 70.0 in | Wt 226.2 lb

## 2024-07-08 DIAGNOSIS — R195 Other fecal abnormalities: Secondary | ICD-10-CM | POA: Diagnosis not present

## 2024-07-08 DIAGNOSIS — R7401 Elevation of levels of liver transaminase levels: Secondary | ICD-10-CM | POA: Diagnosis not present

## 2024-07-08 DIAGNOSIS — K2270 Barrett's esophagus without dysplasia: Secondary | ICD-10-CM

## 2024-07-08 DIAGNOSIS — K219 Gastro-esophageal reflux disease without esophagitis: Secondary | ICD-10-CM | POA: Diagnosis not present

## 2024-07-08 DIAGNOSIS — K439 Ventral hernia without obstruction or gangrene: Secondary | ICD-10-CM | POA: Diagnosis not present

## 2024-07-08 DIAGNOSIS — Z8719 Personal history of other diseases of the digestive system: Secondary | ICD-10-CM

## 2024-07-08 DIAGNOSIS — K5909 Other constipation: Secondary | ICD-10-CM | POA: Diagnosis not present

## 2024-07-08 DIAGNOSIS — K5904 Chronic idiopathic constipation: Secondary | ICD-10-CM

## 2024-07-08 DIAGNOSIS — Z860100 Personal history of colon polyps, unspecified: Secondary | ICD-10-CM

## 2024-07-08 MED ORDER — PEG 3350-KCL-NA BICARB-NACL 420 G PO SOLR: 4000.0000 mL | Freq: Once | ORAL | 0 refills | Status: AC

## 2024-07-08 NOTE — Patient Instructions (Addendum)
 We will get you scheduled for colonoscopy in the near future with Dr. Shaaron.   For your constipation continue taking your MiraLAX  a half a capful every other day, increase to a full capful if you have been experiencing any constipation or if you notice you are more frequently constipated then start taking a half a capful every day.  Continue taking your omeprazole  40 mg twice daily for your reflux, if you start developing any breakthrough symptoms or having any trouble swallowing then please let us  know so we can perform an upper endoscopy for further evaluation.  Given your age and the fact that you have been on reflux medication long-term there is risk of Barrett's esophagus and guidelines do recommend screening.  If at any point in time you decide you would like to proceed with this then please let us  know.  Follow-up as needed.  It was a pleasure to see you today. I want to create trusting relationships with patients. If you receive a survey regarding your visit,  I greatly appreciate you taking time to fill this out on paper or through your MyChart. I value your feedback.  Charmaine Melia, MSN, FNP-BC, AGACNP-BC [facility]

## 2024-07-08 NOTE — Progress Notes (Signed)
 "  GI Office Note    Referring Provider: Toribio Jerel MATSU, MD Primary Care Physician:  Toribio Jerel MATSU, MD Primary Gastroenterologist: Lamar HERO.Rourk, MD  Date:  07/08/2024  ID:  Tony Douglas, DOB 15-Feb-1951, MRN [id number]   Chief Complaint   Chief Complaint  Patient presents with   New Patient (Initial Visit)    Pt here for POS cologuard. Schedule colonoscopy   History of Present Illness  Tony Douglas is a 74 y.o. male with a history of osteoarthritis, diabetes, BPH, nephrolithiasis, HLD, HTN, depression, constipation, diverticulosis, GERD, and asthma presenting today with need to schedule colonoscopy given positive Cologuard.  Colonoscopy with [facility] ( Dr. Obie) in October 2014: - Sessile polyp ranging 3-5 mm in the sigmoid colon removed with cold forceps - Sigmoid diverticulosis - Advised high-fiber diet - Polyp noted to be hyperplastic  Per review of notes it appears he had a colonoscopy also 2004 however unclear of findings.  On omeprazole  40 mg twice daily for his reflux.  Cologuard was ordered at his last office visit in October. Cologuard revealed to be positive in November 2025.  Labs October 2025 with normal CBC, CMP remarkable for glucose of 126, BUN 24, Creatinine 1.2, ALT 56, A1c 6.5, triglycerides 257, PSA 0.4, TSH 2.6, vitamin D 38.  Today:  Complex gastrointestinal history includes diverticulitis with perforation in 2004 requiring resection of approximately six feet of small intestine and a six-day hospitalization. Significant pain and fifteen-pound weight loss preceded surgery, with resolution of symptoms postoperatively. No recurrence of diverticulitis or related complications since surgery.  No changes in bowel habits, rectal bleeding, melena, or significant diarrhea since then. Denies blood in stool and pain with bowel movements. Occasional lower abdominal pain is associated with constipation.  Recent positive Cologuard test obtained during routine colon cancer  screening. Denies alarm symptoms including rectal bleeding, change in bowel habits, or unexplained weight loss. Family history notable for a brother with diverticulitis requiring ostomy; no known family history of colon polyps or colon cancer. Scheduled for colonoscopy for further evaluation.  Chronic constipation managed with half cap of generic Miralax  every other day, resulting in regular bowel movements most days. Dose increased as needed for constipation. No fiber supplementation. No significant diarrhea, blood in stool, or pain with bowel movements. Occasional lower abdominal pain occurs when constipated.  Gastroesophageal reflux disease is longstanding and controlled with omeprazole  twice daily. Previously used Tums and Rolaids. No upper gastrointestinal symptoms such as dysphagia or worsening reflux. No history of upper endoscopy.  Ventral hernia present and longstanding. Occasionally notes a knot or tenderness in the abdomen, which is not persistent or severe.  Additional medical history includes hypertension, kidney stones, and baseline asthma managed with inhalers. Quit smoking in 1994. Denies current tobacco, alcohol, or drug use. Appetite is good and eating well.       Wt Readings from Last 6 Encounters:  07/08/24 226 lb 3.2 oz (102.6 kg)  03/04/21 220 lb (99.8 kg)  04/26/19 231 lb 11.3 oz (105.1 kg)  04/24/19 222 lb 7.1 oz (100.9 kg)  04/22/19 222 lb 8 oz (100.9 kg)  03/06/19 221 lb 6.4 oz (100.4 kg)   Body mass index is 32.46 kg/m.   Current Outpatient Medications  Medication Sig Dispense Refill   acetaminophen  (TYLENOL ) 650 MG CR tablet Take 1,300 mg by mouth every 8 (eight) hours as needed for pain.     budesonide -formoterol  (SYMBICORT ) 160-4.5 MCG/ACT inhaler Take 2 puffs first thing in am and then another  2 puffs about 12 hours later. 1 Inhaler 0   buPROPion  (WELLBUTRIN  SR) 150 MG 12 hr tablet Take 150 mg by mouth 2 (two) times daily.     Cholecalciferol  (VITAMIN D)  125 MCG (5000 UT) CAPS Take 5,000 Units by mouth daily.      doxazosin  (CARDURA ) 4 MG tablet Take 4 mg by mouth at bedtime.     dutasteride  (AVODART ) 0.5 MG capsule Take 0.5 mg by mouth at bedtime.      EPIPEN  2-PAK 0.3 MG/0.3ML SOAJ injection Inject 0.3 mg into the muscle as needed for anaphylaxis.      fluticasone  (FLONASE ) 50 MCG/ACT nasal spray Place 1 spray into both nostrils 2 (two) times daily.      montelukast  (SINGULAIR ) 10 MG tablet Take 1 tablet (10 mg total) by mouth at bedtime. 30 tablet 11   olmesartan -hydrochlorothiazide  (BENICAR  HCT) 20-12.5 MG tablet Take 0.5 tablets by mouth daily.     omeprazole  (PRILOSEC) 20 MG capsule Take 20 mg by mouth daily.      ondansetron  (ZOFRAN ) 4 MG tablet Take 1 tablet (4 mg total) by mouth every 6 (six) hours. 12 tablet 0   polyethylene glycol (MIRALAX  / GLYCOLAX ) 17 g packet 17grams in 6 oz of something to drink twice a day until bowel movement.  LAXITIVE.  Restart if two days since last bowel movement 14 each 0   simvastatin  (ZOCOR ) 40 MG tablet Take 40 mg by mouth daily.      traMADol  (ULTRAM ) 50 MG tablet Take 1-2 tablets (50-100 mg total) by mouth every 6 (six) hours as needed for moderate pain. 15 tablet 0   No current facility-administered medications for this visit.    Past Medical History:  Diagnosis Date   Allergic rhinitis    Asthma    followed by dr wert as needed, per pt controlled   Benign localized prostatic hyperplasia with lower urinary tract symptoms (LUTS)    Chronic constipation    Diverticulosis of colon    GERD (gastroesophageal reflux disease)    History of carpal tunnel syndrome    Bilateral   History of kidney stones    Hyperlipidemia, mixed    Hypertension    MDD (major depressive disorder)    Nephrolithiasis    per CT 02-11-2019 bilateral non-obstructive   Osteoarthritis    PONV (postoperative nausea and vomiting)    Right ureteral stone    Wears contact lenses     Past Surgical History:  Procedure  Laterality Date   APPENDECTOMY  1970s   CARPAL TUNNEL RELEASE Left 04/20/2017   Procedure: LEFT CARPAL TUNNEL RELEASE;  Surgeon: Beverley Toribio FALCON, MD;  Location: [facility];  Service: Orthopedics;  Laterality: Left;   CARPAL TUNNEL RELEASE Right 05/04/2017   Procedure: CARPAL TUNNEL RELEASE;  Surgeon: Beverley Toribio FALCON, MD;  Location: [facility];  Service: Orthopedics;  Laterality: Right;   COLONOSCOPY     CYSTOSCOPY  04/24/2019   Procedure: CYSTOSCOPY FLEXIBLE;  Surgeon: Cam Morene ORN, MD;  Location: [facility];  Service: Urology;;   CYSTOSCOPY/URETEROSCOPY/HOLMIUM LASER/STENT PLACEMENT Right 03/06/2019   Procedure: RIGHT URETEROSCOPY/HOLMIUM LASER/STENT PLACEMENT STONE BASKET RETRIVAL;  Surgeon: Cam Morene ORN, MD;  Location[facility];  Service: Urology;  Laterality: Right;   HAMMER TOE SURGERY Right yrs ago   2nd toe   INGUINAL HERNIA REPAIR Right 09-17-2003  @[facility]    KNEE ARTHROSCOPY Right 2002   w/ post op arthroscopy debridement/ lavage 07-30-2000   KNEE ARTHROSCOPY  WITH MEDIAL MENISECTOMY Left 01/14/2019   Procedure: KNEE ARTHROSCOPY WITH MEDIAL MENISECTOMY VERSES A POSSIBLE UNIT ARTHROPLASTY POSSIBLE A TOTAL ARTHROPLASTY;  Surgeon: Jane Charleston, MD;  Location: [facility];  Service: Orthopedics;  Laterality: Left;   LAPAROSCOPIC SIGMOID COLECTOMY  02-12-2003   @[facility]    diverticulosis   NEPHROLITHOTOMY Left 04/24/2019   Procedure: NEPHROLITHOTOMY PERCUTANEOUS WITH SURGEON ACCESS;  Surgeon: Cam Morene ORN, MD;  Location: [facility];  Service: Urology;  Laterality: Left;   SHOULDER ARTHROSCOPY DISTAL CLAVICLE EXCISION AND OPEN ROTATOR CUFF REPAIR Right 01-17-2008   dr beverley   TOTAL HIP ARTHROPLASTY Right 05/21/2014   Procedure: RIGHT TOTAL HIP ARTHROPLASTY;  Surgeon: Toribio JULIANNA Beverley, MD;  Location: [facility];  Service: Orthopedics;  Laterality: Right;   TOTAL KNEE ARTHROPLASTY Right 12-16-2009   @[facility]    TOTAL KNEE ARTHROPLASTY Left 01/14/2019    Procedure: TOTAL KNEE ARTHROPLASTY;  Surgeon: Jane Charleston, MD;  Location: [facility];  Service: Orthopedics;  Laterality: Left;    Family History  Problem Relation Age of Onset   Heart disease Mother    Emphysema Father        smoked and worked in [employer]   Allergies Brother    Asthma Brother    Diverticulitis Brother    Colon polyps Neg Hx    Colon cancer Neg Hx    Liver disease Neg Hx    Gallbladder disease Neg Hx     Allergies as of 07/08/2024 - Review Complete 07/08/2024  Allergen Reaction Noted   Bee venom Anaphylaxis 04/17/2019    Social History   Socioeconomic History   Marital status: Married    Spouse name: Not on file   Number of children: 1   Years of education: Not on file   Highest education level: Not on file  Occupational History   Occupation: Retired    Associate Professor: [employer]  Tobacco Use   Smoking status: Former    Current packs/day: 0.00    Average packs/day: 1 pack/day for 30.0 years (30.0 ttl pk-yrs)    Types: Cigarettes    Start date: 07/04/1962    Quit date: 07/04/1992    Years since quitting: 32.0   Smokeless tobacco: Never  Vaping Use   Vaping status: Never Used  Substance and Sexual Activity   Alcohol use: Not Currently    Comment: 1 or 2 beers per month   Drug use: No   Sexual activity: Not on file  Other Topics Concern   Not on file  Social History Narrative   Not on file   Social Drivers of Health   Tobacco Use: Medium Risk (07/08/2024)   Patient History    Smoking Tobacco Use: Former    Smokeless Tobacco Use: Never    Passive Exposure: Not on Actuary Strain: Not on file  Food Insecurity: Not on file  Transportation Needs: Not on file  Physical Activity: Not on file  Stress: Not on file  Social Connections: Not on file  Depression (EYV7-0): Not on file  Alcohol Screen: Not on file  Housing: Not on file  Utilities: Not on file  Health Literacy: Not on file    Review of Systems   Gen: Denies fever,  chills, anorexia. Denies fatigue, weakness, weight loss.  CV: Denies chest pain, palpitations, syncope, peripheral edema, and claudication. Resp: Denies dyspnea at rest, cough, wheezing, coughing up blood, and pleurisy. GI: See HPI Derm: Denies rash, itching, dry skin Psych: Denies depression, anxiety, memory loss, confusion. No homicidal or  suicidal ideation.  Heme: Denies bruising, bleeding, and enlarged lymph nodes. + joint pain/stiffness  Physical Exam   BP 125/79   Pulse 93   Temp 98.6 F (37 C)   Ht 5' 10 (1.778 m)   Wt 226 lb 3.2 oz (102.6 kg)   BMI 32.46 kg/m   General:   Alert and oriented. No distress noted. Pleasant and cooperative.  Head:  Normocephalic and atraumatic. Eyes:  Conjuctiva clear without scleral icterus. Mouth:  Oral mucosa pink and moist. Good dentition. No lesions. Lungs:  Clear to auscultation bilaterally. No wheezes, rales, or rhonchi. No distress.  Heart:  S1, S2 present without murmurs appreciated.  Abdomen:  +BS, soft, non-tender and non-distended, rounded. Ventral hernia noted, soft. Mild round firmness noted just to the left of the midline hernia. Palpable left lobe liver border.  Rectal: deferred Msk:  Symmetrical without gross deformities. Normal posture. Extremities:  Without edema. Neurologic:  Alert and  oriented x4 Psych:  Alert and cooperative. Normal mood and affect.  Assessment & Plan  JATHNIEL SMELTZER is a 74 y.o. male presenting today for positive Cologuard and to discuss scheduling colonoscopy.   Colorectal cancer screening following positive Cologuard test Positive Cologuard test warrants colonoscopy for further evaluation.  Discussed the risk of false positives and false negatives with Cologuard, usually about 15% rate.  No alarm symptoms present.  He does have some baseline constipation as noted below which is fairly well-controlled. - Recommended colonoscopy for evaluation of positive Cologuard test.  History of diverticulitis  with prior perforation and bowel resection Remote complicated diverticulitis status post surgical resection in 2004.  Per his history he had a laparoscopic sigmoid colectomy in 2004 at [facility].  Unsure if the entire sigmoid was removed or just a partial section given colonoscopy in 2014 notes some mild sigmoid diverticulosis.  Chronic constipation Chronic constipation managed with intermittent polyethylene glycol. No recent changes in bowel pattern or concerning features. - Advised to increase polyethylene glycol dose on days with increased constipation. - Discussed option to take half a cap daily if symptoms worsen.  Gastroesophageal reflux disease Longstanding, well-controlled GERD on twice daily omeprazole  40 mg. Increased risk for Barrett's esophagus due to chronic symptoms and prolonged PPI use as well as demographics. No current upper GI symptoms or dysphagia.  Discussed the importance for screening for Barrett's, not pursuing at this time.  May consider in the future if having breakthrough symptoms or development of dysphagia. - Continue omeprazole  40 mg twice daily - Discussed need for upper endoscopy if symptoms worsen or dysphagia develops.  Ventral hernia Chronic, asymptomatic ventral hernia likely secondary to prior abdominal surgery. No evidence of complications or acute changes. - Discussed conservative management unless hernia enlarges or becomes symptomatic.   Elevated ALT Mildly elevated ALT on labs in October.  In the setting of diabetes and elevated BMI, suspect MASH as cause. If ongoing elevation in the future will consider further workup. Right now being monitored by PCP.  - Needs control of metabolic syndrome (control of BP, blood glucose, and weight loss)     Proceed with colonoscopy with propofol  by Dr. Shaaron in near future: the risks, benefits, and alternatives have been discussed with the patient in detail. The patient states understanding and desires to proceed.  ASA 3 (any room)   Follow up   Follow up as needed   Charmaine Melia, MSN, FNP-BC, AGACNP-BC [facility] "

## 2024-07-08 NOTE — H&P (View-Only) (Signed)
 "  GI Office Note    Referring Provider: Toribio Jerel MATSU, MD Primary Care Physician:  Toribio Jerel MATSU, MD Primary Gastroenterologist: Lamar HERO.Rourk, MD  Date:  07/08/2024  ID:  Tony Douglas, DOB 15-Feb-1951, MRN [id number]   Chief Complaint   Chief Complaint  Patient presents with   New Patient (Initial Visit)    Pt here for POS cologuard. Schedule colonoscopy   History of Present Illness  Tony Douglas is a 74 y.o. male with a history of osteoarthritis, diabetes, BPH, nephrolithiasis, HLD, HTN, depression, constipation, diverticulosis, GERD, and asthma presenting today with need to schedule colonoscopy given positive Cologuard.  Colonoscopy with [facility] ( Dr. Obie) in October 2014: - Sessile polyp ranging 3-5 mm in the sigmoid colon removed with cold forceps - Sigmoid diverticulosis - Advised high-fiber diet - Polyp noted to be hyperplastic  Per review of notes it appears he had a colonoscopy also 2004 however unclear of findings.  On omeprazole  40 mg twice daily for his reflux.  Cologuard was ordered at his last office visit in October. Cologuard revealed to be positive in November 2025.  Labs October 2025 with normal CBC, CMP remarkable for glucose of 126, BUN 24, Creatinine 1.2, ALT 56, A1c 6.5, triglycerides 257, PSA 0.4, TSH 2.6, vitamin D 38.  Today:  Complex gastrointestinal history includes diverticulitis with perforation in 2004 requiring resection of approximately six feet of small intestine and a six-day hospitalization. Significant pain and fifteen-pound weight loss preceded surgery, with resolution of symptoms postoperatively. No recurrence of diverticulitis or related complications since surgery.  No changes in bowel habits, rectal bleeding, melena, or significant diarrhea since then. Denies blood in stool and pain with bowel movements. Occasional lower abdominal pain is associated with constipation.  Recent positive Cologuard test obtained during routine colon cancer  screening. Denies alarm symptoms including rectal bleeding, change in bowel habits, or unexplained weight loss. Family history notable for a brother with diverticulitis requiring ostomy; no known family history of colon polyps or colon cancer. Scheduled for colonoscopy for further evaluation.  Chronic constipation managed with half cap of generic Miralax  every other day, resulting in regular bowel movements most days. Dose increased as needed for constipation. No fiber supplementation. No significant diarrhea, blood in stool, or pain with bowel movements. Occasional lower abdominal pain occurs when constipated.  Gastroesophageal reflux disease is longstanding and controlled with omeprazole  twice daily. Previously used Tums and Rolaids. No upper gastrointestinal symptoms such as dysphagia or worsening reflux. No history of upper endoscopy.  Ventral hernia present and longstanding. Occasionally notes a knot or tenderness in the abdomen, which is not persistent or severe.  Additional medical history includes hypertension, kidney stones, and baseline asthma managed with inhalers. Quit smoking in 1994. Denies current tobacco, alcohol, or drug use. Appetite is good and eating well.       Wt Readings from Last 6 Encounters:  07/08/24 226 lb 3.2 oz (102.6 kg)  03/04/21 220 lb (99.8 kg)  04/26/19 231 lb 11.3 oz (105.1 kg)  04/24/19 222 lb 7.1 oz (100.9 kg)  04/22/19 222 lb 8 oz (100.9 kg)  03/06/19 221 lb 6.4 oz (100.4 kg)   Body mass index is 32.46 kg/m.   Current Outpatient Medications  Medication Sig Dispense Refill   acetaminophen  (TYLENOL ) 650 MG CR tablet Take 1,300 mg by mouth every 8 (eight) hours as needed for pain.     budesonide -formoterol  (SYMBICORT ) 160-4.5 MCG/ACT inhaler Take 2 puffs first thing in am and then another  2 puffs about 12 hours later. 1 Inhaler 0   buPROPion  (WELLBUTRIN  SR) 150 MG 12 hr tablet Take 150 mg by mouth 2 (two) times daily.     Cholecalciferol  (VITAMIN D)  125 MCG (5000 UT) CAPS Take 5,000 Units by mouth daily.      doxazosin  (CARDURA ) 4 MG tablet Take 4 mg by mouth at bedtime.     dutasteride  (AVODART ) 0.5 MG capsule Take 0.5 mg by mouth at bedtime.      EPIPEN  2-PAK 0.3 MG/0.3ML SOAJ injection Inject 0.3 mg into the muscle as needed for anaphylaxis.      fluticasone  (FLONASE ) 50 MCG/ACT nasal spray Place 1 spray into both nostrils 2 (two) times daily.      montelukast  (SINGULAIR ) 10 MG tablet Take 1 tablet (10 mg total) by mouth at bedtime. 30 tablet 11   olmesartan -hydrochlorothiazide  (BENICAR  HCT) 20-12.5 MG tablet Take 0.5 tablets by mouth daily.     omeprazole  (PRILOSEC) 20 MG capsule Take 20 mg by mouth daily.      ondansetron  (ZOFRAN ) 4 MG tablet Take 1 tablet (4 mg total) by mouth every 6 (six) hours. 12 tablet 0   polyethylene glycol (MIRALAX  / GLYCOLAX ) 17 g packet 17grams in 6 oz of something to drink twice a day until bowel movement.  LAXITIVE.  Restart if two days since last bowel movement 14 each 0   simvastatin  (ZOCOR ) 40 MG tablet Take 40 mg by mouth daily.      traMADol  (ULTRAM ) 50 MG tablet Take 1-2 tablets (50-100 mg total) by mouth every 6 (six) hours as needed for moderate pain. 15 tablet 0   No current facility-administered medications for this visit.    Past Medical History:  Diagnosis Date   Allergic rhinitis    Asthma    followed by dr wert as needed, per pt controlled   Benign localized prostatic hyperplasia with lower urinary tract symptoms (LUTS)    Chronic constipation    Diverticulosis of colon    GERD (gastroesophageal reflux disease)    History of carpal tunnel syndrome    Bilateral   History of kidney stones    Hyperlipidemia, mixed    Hypertension    MDD (major depressive disorder)    Nephrolithiasis    per CT 02-11-2019 bilateral non-obstructive   Osteoarthritis    PONV (postoperative nausea and vomiting)    Right ureteral stone    Wears contact lenses     Past Surgical History:  Procedure  Laterality Date   APPENDECTOMY  1970s   CARPAL TUNNEL RELEASE Left 04/20/2017   Procedure: LEFT CARPAL TUNNEL RELEASE;  Surgeon: Beverley Toribio FALCON, MD;  Location: [facility];  Service: Orthopedics;  Laterality: Left;   CARPAL TUNNEL RELEASE Right 05/04/2017   Procedure: CARPAL TUNNEL RELEASE;  Surgeon: Beverley Toribio FALCON, MD;  Location: [facility];  Service: Orthopedics;  Laterality: Right;   COLONOSCOPY     CYSTOSCOPY  04/24/2019   Procedure: CYSTOSCOPY FLEXIBLE;  Surgeon: Cam Morene ORN, MD;  Location: [facility];  Service: Urology;;   CYSTOSCOPY/URETEROSCOPY/HOLMIUM LASER/STENT PLACEMENT Right 03/06/2019   Procedure: RIGHT URETEROSCOPY/HOLMIUM LASER/STENT PLACEMENT STONE BASKET RETRIVAL;  Surgeon: Cam Morene ORN, MD;  Location[facility];  Service: Urology;  Laterality: Right;   HAMMER TOE SURGERY Right yrs ago   2nd toe   INGUINAL HERNIA REPAIR Right 09-17-2003  @[facility]    KNEE ARTHROSCOPY Right 2002   w/ post op arthroscopy debridement/ lavage 07-30-2000   KNEE ARTHROSCOPY  WITH MEDIAL MENISECTOMY Left 01/14/2019   Procedure: KNEE ARTHROSCOPY WITH MEDIAL MENISECTOMY VERSES A POSSIBLE UNIT ARTHROPLASTY POSSIBLE A TOTAL ARTHROPLASTY;  Surgeon: Jane Charleston, MD;  Location: [facility];  Service: Orthopedics;  Laterality: Left;   LAPAROSCOPIC SIGMOID COLECTOMY  02-12-2003   @[facility]    diverticulosis   NEPHROLITHOTOMY Left 04/24/2019   Procedure: NEPHROLITHOTOMY PERCUTANEOUS WITH SURGEON ACCESS;  Surgeon: Cam Morene ORN, MD;  Location: [facility];  Service: Urology;  Laterality: Left;   SHOULDER ARTHROSCOPY DISTAL CLAVICLE EXCISION AND OPEN ROTATOR CUFF REPAIR Right 01-17-2008   dr beverley   TOTAL HIP ARTHROPLASTY Right 05/21/2014   Procedure: RIGHT TOTAL HIP ARTHROPLASTY;  Surgeon: Toribio JULIANNA Beverley, MD;  Location: [facility];  Service: Orthopedics;  Laterality: Right;   TOTAL KNEE ARTHROPLASTY Right 12-16-2009   @[facility]    TOTAL KNEE ARTHROPLASTY Left 01/14/2019    Procedure: TOTAL KNEE ARTHROPLASTY;  Surgeon: Jane Charleston, MD;  Location: [facility];  Service: Orthopedics;  Laterality: Left;    Family History  Problem Relation Age of Onset   Heart disease Mother    Emphysema Father        smoked and worked in [employer]   Allergies Brother    Asthma Brother    Diverticulitis Brother    Colon polyps Neg Hx    Colon cancer Neg Hx    Liver disease Neg Hx    Gallbladder disease Neg Hx     Allergies as of 07/08/2024 - Review Complete 07/08/2024  Allergen Reaction Noted   Bee venom Anaphylaxis 04/17/2019    Social History   Socioeconomic History   Marital status: Married    Spouse name: Not on file   Number of children: 1   Years of education: Not on file   Highest education level: Not on file  Occupational History   Occupation: Retired    Associate Professor: [employer]  Tobacco Use   Smoking status: Former    Current packs/day: 0.00    Average packs/day: 1 pack/day for 30.0 years (30.0 ttl pk-yrs)    Types: Cigarettes    Start date: 07/04/1962    Quit date: 07/04/1992    Years since quitting: 32.0   Smokeless tobacco: Never  Vaping Use   Vaping status: Never Used  Substance and Sexual Activity   Alcohol use: Not Currently    Comment: 1 or 2 beers per month   Drug use: No   Sexual activity: Not on file  Other Topics Concern   Not on file  Social History Narrative   Not on file   Social Drivers of Health   Tobacco Use: Medium Risk (07/08/2024)   Patient History    Smoking Tobacco Use: Former    Smokeless Tobacco Use: Never    Passive Exposure: Not on Actuary Strain: Not on file  Food Insecurity: Not on file  Transportation Needs: Not on file  Physical Activity: Not on file  Stress: Not on file  Social Connections: Not on file  Depression (EYV7-0): Not on file  Alcohol Screen: Not on file  Housing: Not on file  Utilities: Not on file  Health Literacy: Not on file    Review of Systems   Gen: Denies fever,  chills, anorexia. Denies fatigue, weakness, weight loss.  CV: Denies chest pain, palpitations, syncope, peripheral edema, and claudication. Resp: Denies dyspnea at rest, cough, wheezing, coughing up blood, and pleurisy. GI: See HPI Derm: Denies rash, itching, dry skin Psych: Denies depression, anxiety, memory loss, confusion. No homicidal or  suicidal ideation.  Heme: Denies bruising, bleeding, and enlarged lymph nodes. + joint pain/stiffness  Physical Exam   BP 125/79   Pulse 93   Temp 98.6 F (37 C)   Ht 5' 10 (1.778 m)   Wt 226 lb 3.2 oz (102.6 kg)   BMI 32.46 kg/m   General:   Alert and oriented. No distress noted. Pleasant and cooperative.  Head:  Normocephalic and atraumatic. Eyes:  Conjuctiva clear without scleral icterus. Mouth:  Oral mucosa pink and moist. Good dentition. No lesions. Lungs:  Clear to auscultation bilaterally. No wheezes, rales, or rhonchi. No distress.  Heart:  S1, S2 present without murmurs appreciated.  Abdomen:  +BS, soft, non-tender and non-distended, rounded. Ventral hernia noted, soft. Mild round firmness noted just to the left of the midline hernia. Palpable left lobe liver border.  Rectal: deferred Msk:  Symmetrical without gross deformities. Normal posture. Extremities:  Without edema. Neurologic:  Alert and  oriented x4 Psych:  Alert and cooperative. Normal mood and affect.  Assessment & Plan  Tony Douglas is a 74 y.o. male presenting today for positive Cologuard and to discuss scheduling colonoscopy.   Colorectal cancer screening following positive Cologuard test Positive Cologuard test warrants colonoscopy for further evaluation.  Discussed the risk of false positives and false negatives with Cologuard, usually about 15% rate.  No alarm symptoms present.  He does have some baseline constipation as noted below which is fairly well-controlled. - Recommended colonoscopy for evaluation of positive Cologuard test.  History of diverticulitis  with prior perforation and bowel resection Remote complicated diverticulitis status post surgical resection in 2004.  Per his history he had a laparoscopic sigmoid colectomy in 2004 at [facility].  Unsure if the entire sigmoid was removed or just a partial section given colonoscopy in 2014 notes some mild sigmoid diverticulosis.  Chronic constipation Chronic constipation managed with intermittent polyethylene glycol. No recent changes in bowel pattern or concerning features. - Advised to increase polyethylene glycol dose on days with increased constipation. - Discussed option to take half a cap daily if symptoms worsen.  Gastroesophageal reflux disease Longstanding, well-controlled GERD on twice daily omeprazole  40 mg. Increased risk for Barrett's esophagus due to chronic symptoms and prolonged PPI use as well as demographics. No current upper GI symptoms or dysphagia.  Discussed the importance for screening for Barrett's, not pursuing at this time.  May consider in the future if having breakthrough symptoms or development of dysphagia. - Continue omeprazole  40 mg twice daily - Discussed need for upper endoscopy if symptoms worsen or dysphagia develops.  Ventral hernia Chronic, asymptomatic ventral hernia likely secondary to prior abdominal surgery. No evidence of complications or acute changes. - Discussed conservative management unless hernia enlarges or becomes symptomatic.   Elevated ALT Mildly elevated ALT on labs in October.  In the setting of diabetes and elevated BMI, suspect MASH as cause. If ongoing elevation in the future will consider further workup. Right now being monitored by PCP.  - Needs control of metabolic syndrome (control of BP, blood glucose, and weight loss)     Proceed with colonoscopy with propofol  by Dr. Shaaron in near future: the risks, benefits, and alternatives have been discussed with the patient in detail. The patient states understanding and desires to proceed.  ASA 3 (any room)   Follow up   Follow up as needed   Charmaine Melia, MSN, FNP-BC, AGACNP-BC [facility] "

## 2024-07-09 ENCOUNTER — Telehealth: Payer: Self-pay | Admitting: Gastroenterology

## 2024-07-09 NOTE — Telephone Encounter (Signed)
 Pt called back and is aware

## 2024-07-09 NOTE — Telephone Encounter (Signed)
 Called pt, LMOVM to advise EGD added to procedure. Message also sent to endo

## 2024-07-09 NOTE — Telephone Encounter (Signed)
 Patient came into office states he was fine with having the endoscopy along with his colonoscopy. Patient said his colonoscopy was already schedule. Charmaine told him to let us  know if he wanted to do both.

## 2024-07-16 NOTE — Telephone Encounter (Signed)
 Pt's wife Donia (on dpr)  called and stated she had talked to [employer] and was told that EGD needed to have a PA. Advised Doris that PA was checked for EGD and says no PA is needed. Informed her that pre-service center would give them a call if there is any amount that pt needs to pay. Verbalized understanding.  Availity PA for EGD: Procedure Code 1 56764  Quantity 1 Units  Procedure From - To Date 2024-07-26  Status NO AUTH REQUIRED

## 2024-07-26 ENCOUNTER — Ambulatory Visit (HOSPITAL_COMMUNITY)

## 2024-07-26 ENCOUNTER — Encounter (HOSPITAL_COMMUNITY)
Admission: RE | Disposition: A | Discharge status: Home / Self Care | Payer: Self-pay | Source: Home / Self Care | Attending: Internal Medicine

## 2024-07-26 ENCOUNTER — Other Ambulatory Visit: Payer: Self-pay

## 2024-07-26 ENCOUNTER — Ambulatory Visit (HOSPITAL_COMMUNITY)
Admission: RE | Admit: 2024-07-26 | Discharge: 2024-07-26 | Disposition: A | Discharge status: Home / Self Care | Attending: Internal Medicine | Admitting: Internal Medicine

## 2024-07-26 ENCOUNTER — Encounter (HOSPITAL_COMMUNITY): Payer: Self-pay | Admitting: Internal Medicine

## 2024-07-26 DIAGNOSIS — Z8719 Personal history of other diseases of the digestive system: Secondary | ICD-10-CM | POA: Insufficient documentation

## 2024-07-26 DIAGNOSIS — K449 Diaphragmatic hernia without obstruction or gangrene: Secondary | ICD-10-CM | POA: Insufficient documentation

## 2024-07-26 DIAGNOSIS — N400 Enlarged prostate without lower urinary tract symptoms: Secondary | ICD-10-CM | POA: Diagnosis not present

## 2024-07-26 DIAGNOSIS — B3781 Candidal esophagitis: Secondary | ICD-10-CM | POA: Diagnosis not present

## 2024-07-26 DIAGNOSIS — Z1211 Encounter for screening for malignant neoplasm of colon: Secondary | ICD-10-CM | POA: Diagnosis not present

## 2024-07-26 DIAGNOSIS — R195 Other fecal abnormalities: Secondary | ICD-10-CM | POA: Insufficient documentation

## 2024-07-26 DIAGNOSIS — D124 Benign neoplasm of descending colon: Secondary | ICD-10-CM

## 2024-07-26 DIAGNOSIS — K222 Esophageal obstruction: Secondary | ICD-10-CM | POA: Diagnosis not present

## 2024-07-26 DIAGNOSIS — Z8379 Family history of other diseases of the digestive system: Secondary | ICD-10-CM | POA: Insufficient documentation

## 2024-07-26 DIAGNOSIS — E119 Type 2 diabetes mellitus without complications: Secondary | ICD-10-CM | POA: Diagnosis not present

## 2024-07-26 DIAGNOSIS — R7401 Elevation of levels of liver transaminase levels: Secondary | ICD-10-CM | POA: Insufficient documentation

## 2024-07-26 DIAGNOSIS — Q399 Congenital malformation of esophagus, unspecified: Secondary | ICD-10-CM | POA: Diagnosis not present

## 2024-07-26 DIAGNOSIS — Z860100 Personal history of colon polyps, unspecified: Secondary | ICD-10-CM | POA: Diagnosis not present

## 2024-07-26 DIAGNOSIS — Z980 Intestinal bypass and anastomosis status: Secondary | ICD-10-CM | POA: Diagnosis not present

## 2024-07-26 DIAGNOSIS — E8881 Metabolic syndrome: Secondary | ICD-10-CM | POA: Insufficient documentation

## 2024-07-26 DIAGNOSIS — F32A Depression, unspecified: Secondary | ICD-10-CM | POA: Diagnosis not present

## 2024-07-26 DIAGNOSIS — K439 Ventral hernia without obstruction or gangrene: Secondary | ICD-10-CM | POA: Diagnosis not present

## 2024-07-26 DIAGNOSIS — K219 Gastro-esophageal reflux disease without esophagitis: Secondary | ICD-10-CM | POA: Insufficient documentation

## 2024-07-26 DIAGNOSIS — Z9049 Acquired absence of other specified parts of digestive tract: Secondary | ICD-10-CM | POA: Insufficient documentation

## 2024-07-26 DIAGNOSIS — I10 Essential (primary) hypertension: Secondary | ICD-10-CM | POA: Diagnosis not present

## 2024-07-26 DIAGNOSIS — K3189 Other diseases of stomach and duodenum: Secondary | ICD-10-CM | POA: Diagnosis not present

## 2024-07-26 DIAGNOSIS — Z87442 Personal history of urinary calculi: Secondary | ICD-10-CM | POA: Insufficient documentation

## 2024-07-26 DIAGNOSIS — K635 Polyp of colon: Secondary | ICD-10-CM | POA: Insufficient documentation

## 2024-07-26 DIAGNOSIS — E782 Mixed hyperlipidemia: Secondary | ICD-10-CM | POA: Diagnosis not present

## 2024-07-26 DIAGNOSIS — Z79899 Other long term (current) drug therapy: Secondary | ICD-10-CM | POA: Diagnosis not present

## 2024-07-26 DIAGNOSIS — Z87891 Personal history of nicotine dependence: Secondary | ICD-10-CM | POA: Insufficient documentation

## 2024-07-26 DIAGNOSIS — J45909 Unspecified asthma, uncomplicated: Secondary | ICD-10-CM | POA: Insufficient documentation

## 2024-07-26 DIAGNOSIS — K2289 Other specified disease of esophagus: Secondary | ICD-10-CM | POA: Insufficient documentation

## 2024-07-26 DIAGNOSIS — K2270 Barrett's esophagus without dysplasia: Secondary | ICD-10-CM

## 2024-07-26 DIAGNOSIS — M1990 Unspecified osteoarthritis, unspecified site: Secondary | ICD-10-CM | POA: Diagnosis not present

## 2024-07-26 DIAGNOSIS — K5909 Other constipation: Secondary | ICD-10-CM | POA: Diagnosis not present

## 2024-07-26 LAB — KOH PREP

## 2024-07-26 MED ORDER — LACTATED RINGERS IV SOLN: INTRAVENOUS | Status: DC

## 2024-07-26 MED ORDER — LIDOCAINE 2% (20 MG/ML) 5 ML SYRINGE
INTRAMUSCULAR | Status: DC | PRN
  Administered 2024-07-26: 100 mg via INTRAVENOUS

## 2024-07-26 MED ORDER — ESMOLOL HCL 100 MG/10ML IV SOLN
INTRAVENOUS | Status: DC | PRN
  Administered 2024-07-26 (×2): 30 mg via INTRAVENOUS

## 2024-07-26 MED ORDER — GLYCOPYRROLATE PF 0.2 MG/ML IJ SOSY
PREFILLED_SYRINGE | INTRAMUSCULAR | Status: DC | PRN
  Administered 2024-07-26: .2 mg via INTRAVENOUS

## 2024-07-26 MED ORDER — PHENYLEPHRINE 80 MCG/ML (10ML) SYRINGE FOR IV PUSH (FOR BLOOD PRESSURE SUPPORT)
PREFILLED_SYRINGE | INTRAVENOUS | Status: DC | PRN
  Administered 2024-07-26 (×5): 160 ug via INTRAVENOUS

## 2024-07-26 MED ORDER — EPHEDRINE SULFATE (PRESSORS) 25 MG/5ML IV SOSY
PREFILLED_SYRINGE | INTRAVENOUS | Status: DC | PRN
  Administered 2024-07-26 (×2): 10 mg via INTRAVENOUS

## 2024-07-26 MED ORDER — PROPOFOL 500 MG/50ML IV EMUL
INTRAVENOUS | Status: DC | PRN
  Administered 2024-07-26: 150 ug/kg/min via INTRAVENOUS
  Administered 2024-07-26: 30 mg via INTRAVENOUS
  Administered 2024-07-26: 70 mg via INTRAVENOUS

## 2024-07-26 MED ORDER — LACTATED RINGERS IV BOLUS
500.0000 mL | Freq: Once | INTRAVENOUS | Status: AC
  Administered 2024-07-26: 500 mL via INTRAVENOUS

## 2024-07-26 MED ORDER — LACTATED RINGERS IV SOLN: INTRAVENOUS | Status: DC | PRN

## 2024-07-26 NOTE — Transfer of Care (Signed)
 Immediate Anesthesia Transfer of Care Note  Patient: Tony Douglas  Procedure(s) Performed: COLONOSCOPY EGD (ESOPHAGOGASTRODUODENOSCOPY) ESOPHAGOSCOPY, WITH BRUSH BIOPSY  Patient Location: Short Stay  Anesthesia Type:General  Level of Consciousness: awake, alert , and oriented  Airway & Oxygen  Therapy: Patient Spontanous Breathing  Post-op Assessment: Report given to RN and Post -op Vital signs reviewed and unstable, Anesthesiologist notified  Post vital signs: Reviewed  Last Vitals:  Vitals Value Taken Time  BP 95/69 07/26/24 11:03  Temp 36.4 C 07/26/24 10:58  Pulse 113 07/26/24 10:58  Resp 16 07/26/24 11:03  SpO2 97 % 07/26/24 11:03    Last Pain:  Vitals:   07/26/24 1058  TempSrc: Oral  PainSc: 0-No pain      Patients Stated Pain Goal: 7 (07/26/24 9166)  Complications: No notable events documented.

## 2024-07-26 NOTE — Discharge Instructions (Addendum)
 EGD Discharge instructions Please read the instructions outlined below and refer to this sheet in the next few weeks. These discharge instructions provide you with general information on caring for yourself after you leave the hospital. Your doctor may also give you specific instructions. While your treatment has been planned according to the most current medical practices available, unavoidable complications occasionally occur. If you have any problems or questions after discharge, please call your doctor. ACTIVITY You may resume your regular activity but move at a slower pace for the next 24 hours.  Take frequent rest periods for the next 24 hours.  Walking will help expel (get rid of) the air and reduce the bloated feeling in your abdomen.  No driving for 24 hours (because of the anesthesia (medicine) used during the test).  You may shower.  Do not sign any important legal documents or operate any machinery for 24 hours (because of the anesthesia used during the test).  NUTRITION Drink plenty of fluids.  You may resume your normal diet.  Begin with a light meal and progress to your normal diet.  Avoid alcoholic beverages for 24 hours or as instructed by your caregiver.  MEDICATIONS You may resume your normal medications unless your caregiver tells you otherwise.  WHAT YOU CAN EXPECT TODAY You may experience abdominal discomfort such as a feeling of fullness or gas pains.  FOLLOW-UP Your doctor will discuss the results of your test with you.  SEEK IMMEDIATE MEDICAL ATTENTION IF ANY OF THE FOLLOWING OCCUR: Excessive nausea (feeling sick to your stomach) and/or vomiting.  Severe abdominal pain and distention (swelling).  Trouble swallowing.  Temperature over 101 F (37.8 C).  Rectal bleeding or vomiting of blood.    Colonoscopy Discharge Instructions  Read the instructions outlined below and refer to this sheet in the next few weeks. These discharge instructions provide you with  general information on caring for yourself after you leave the hospital. Your doctor may also give you specific instructions. While your treatment has been planned according to the most current medical practices available, unavoidable complications occasionally occur. If you have any problems or questions after discharge, call Dr. Shaaron at [phone]. ACTIVITY You may resume your regular activity, but move at a slower pace for the next 24 hours.  Take frequent rest periods for the next 24 hours.  Walking will help get rid of the air and reduce the bloated feeling in your belly (abdomen).  No driving for 24 hours (because of the medicine (anesthesia) used during the test).   Do not sign any important legal documents or operate any machinery for 24 hours (because of the anesthesia used during the test).  NUTRITION Drink plenty of fluids.  You may resume your normal diet as instructed by your doctor.  Begin with a light meal and progress to your normal diet. Heavy or fried foods are harder to digest and may make you feel sick to your stomach (nauseated).  Avoid alcoholic beverages for 24 hours or as instructed.  MEDICATIONS You may resume your normal medications unless your doctor tells you otherwise.  WHAT YOU CAN EXPECT TODAY Some feelings of bloating in the abdomen.  Passage of more gas than usual.  Spotting of blood in your stool or on the toilet paper.  IF YOU HAD POLYPS REMOVED DURING THE COLONOSCOPY: No aspirin  products for 7 days or as instructed.  No alcohol for 7 days or as instructed.  Eat a soft diet for the next 24 hours.  FINDING  OUT THE RESULTS OF YOUR TEST Not all test results are available during your visit. If your test results are not back during the visit, make an appointment with your caregiver to find out the results. Do not assume everything is normal if you have not heard from your caregiver or the medical facility. It is important for you to follow up on all of your test  results.  SEEK IMMEDIATE MEDICAL ATTENTION IF: You have more than a spotting of blood in your stool.  Your belly is swollen (abdominal distention).  You are nauseated or vomiting.  You have a temperature over 101.  You have abdominal pain or discomfort that is severe or gets worse throughout the day.      Your esophagus was inflamed as was your stomach.  Samples taken.   Colon Mildly inflamed.  Biopsies taken.  No polyps.  A future colonoscopy is not recommended unless new symptoms develop  Further recommendations to follow pending review of pathology report

## 2024-07-26 NOTE — Progress Notes (Signed)
 VSS. 300cc of IV bolus infused, Ok to discharge home per Dr. Herschell.  Encouraged to follow-up with cardiologist.

## 2024-07-26 NOTE — Anesthesia Postprocedure Evaluation (Signed)
"   Anesthesia Post Note  Patient: STEEN BISIG  Procedure(s) Performed: COLONOSCOPY EGD (ESOPHAGOGASTRODUODENOSCOPY) ESOPHAGOSCOPY, WITH BRUSH BIOPSY  Patient location during evaluation: PACU Anesthesia Type: General Level of consciousness: awake and alert Pain management: pain level controlled Vital Signs Assessment: post-procedure vital signs reviewed and stable Respiratory status: spontaneous breathing, nonlabored ventilation, respiratory function stable and patient connected to nasal cannula oxygen  Cardiovascular status: blood pressure returned to baseline and stable Postop Assessment: no apparent nausea or vomiting Anesthetic complications: no Comments: Hr still elevated post op 12 lead EKG stb at 108 Pt denies any symptoms of cp/sob LR bolus brought hr down to 90's Pt advised to stay hydrated today and follow up with cardiologist   No notable events documented.   Last Vitals:  Vitals:   07/26/24 1140 07/26/24 1145  BP: 125/81 133/84  Pulse: 99 97  Resp: 16 15  Temp:    SpO2: 93% 95%    Last Pain:  Vitals:   07/26/24 1058  TempSrc: Oral  PainSc: 0-No pain                 Andrea Limes      "

## 2024-07-26 NOTE — Anesthesia Preprocedure Evaluation (Addendum)
"                                    Anesthesia Evaluation  Patient identified by MRN, date of birth, ID band Patient awake    Reviewed: Allergy & Precautions, H&P , NPO status , Patient's Chart, lab work & pertinent test results  History of Anesthesia Complications (+) PONV and history of anesthetic complications  Airway Mallampati: II  TM Distance: >3 FB Neck ROM: Full    Dental no notable dental hx.    Pulmonary asthma , former smoker   Pulmonary exam normal breath sounds clear to auscultation       Cardiovascular hypertension, Normal cardiovascular exam Rhythm:Regular Rate:Tachycardia     Neuro/Psych  PSYCHIATRIC DISORDERS  Depression    negative neurological ROS     GI/Hepatic Neg liver ROS,GERD  ,,  Endo/Other  negative endocrine ROS    Renal/GU Renal diseasestones  negative genitourinary   Musculoskeletal  (+) Arthritis ,    Abdominal   Peds negative pediatric ROS (+)  Hematology negative hematology ROS (+)   Anesthesia Other Findings   Reproductive/Obstetrics negative OB ROS                              Anesthesia Physical Anesthesia Plan  ASA: 2  Anesthesia Plan: General   Post-op Pain Management:    Induction: Intravenous  PONV Risk Score and Plan:   Airway Management Planned: Nasal Cannula  Additional Equipment:   Intra-op Plan:   Post-operative Plan:   Informed Consent: I have reviewed the patients History and Physical, chart, labs and discussed the procedure including the risks, benefits and alternatives for the proposed anesthesia with the patient or authorized representative who has indicated his/her understanding and acceptance.     Dental advisory given  Plan Discussed with: CRNA  Anesthesia Plan Comments:          Anesthesia Quick Evaluation  "

## 2024-07-26 NOTE — Op Note (Signed)
 [facility] Patient Name: Tony Douglas Procedure Date: 07/26/2024 10:04 AM MRN: [id number] Date of Birth: 1950/09/14 Attending MD: [name] , MD, [id number] CSN: [id number] Age: 74 Admit Type: Outpatient Procedure:                Upper GI endoscopy Indications:              Heartburn /screening Providers:                [name], MD, Ashley Goins, Kristine L.                            Shirlean Balm, Technician Referring MD:             [name], MD Medicines:                Propofol  per Anesthesia Complications:            No immediate complications. Estimated Blood Loss:     Estimated blood loss was minimal. Procedure:                Pre-Anesthesia Assessment:                           - Prior to the procedure, a History and Physical                            was performed, and patient medications and                            allergies were reviewed. The patient's tolerance of                            previous anesthesia was also reviewed. The risks                            and benefits of the procedure and the sedation                            options and risks were discussed with the patient.                            All questions were answered, and informed consent                            was obtained. Prior Anticoagulants: The patient has                            taken no anticoagulant or antiplatelet agents. ASA                            Grade Assessment: III - A patient with severe                            systemic disease. After reviewing the risks and  benefits, the patient was deemed in satisfactory                            condition to undergo the procedure.                           After obtaining informed consent, the endoscope was                            passed under direct vision. Throughout the                            procedure, the patient's blood pressure, pulse, and                             oxygen  saturations were monitored continuously. The                            Endoscope was introduced through the mouth, and                            advanced to the second part of duodenum. The upper                            GI endoscopy was accomplished without difficulty.                            The patient tolerated the procedure well. Scope In: 10:23:57 AM Scope Out: 10:30:31 AM Total Procedure Duration: 0 hours 6 minutes 34 seconds  Findings:      A mild Schatzki ring was found at the gastroesophageal junction.       Multiple areas of cream-colored cottage cheeselike plaques proximal       esophagus (see photos). Tubular esophagus remained patent.      A medium-sized hiatal hernia was present. Mild snake skinning a few       scale appearance of the gastric mucosa. No ulcer or infiltrating       process. Patent pylorus. This was biopsied with a cold forceps for       histology. Estimated blood loss was minimal.      The duodenal bulb and second portion of the duodenum were normal.       Esophageal mucosa brushed for KOH prep. Impression:               - Mild Schatzki ring. Esophageal plaquesstatus                            post KOH brushing                           - Medium-sized hiatal hernia. Abnormal gastric                            mucosabiopsied.                           - Normal duodenal bulb  and second portion of the                            duodenum. Moderate Sedation:      Moderate (conscious) sedation was personally administered by an       anesthesia professional. The following parameters were monitored: oxygen        saturation, heart rate, blood pressure, respiratory rate, EKG, adequacy       of pulmonary ventilation, and response to care. Recommendation:           - Patient has a contact number available for                            emergencies. The signs and symptoms of potential                            delayed  complications were discussed with the                            patient. Return to normal activities tomorrow.                            Written discharge instructions were provided to the                            patient.                           - Advance diet as tolerated. Follow-up on samples.                           - Continue present medications.                           - Return to my office (date not yet determined).                            See colonoscopy report. Procedure Code(s):        --- Professional ---                           [phone], Esophagogastroduodenoscopy, flexible,                            transoral; with biopsy, single or multiple Diagnosis Code(s):        --- Professional ---                           K22.2, Esophageal obstruction                           K44.9, Diaphragmatic hernia without obstruction or                            gangrene  R12, Heartburn CPT copyright 2022 American [employer]. All rights reserved. The codes documented in this report are preliminary and upon coder review may  be revised to meet current compliance requirements. [name], MD [name], MD 07/26/2024 10:56:43 AM This report has been signed electronically. Number of Addenda: 0

## 2024-07-26 NOTE — Interval H&P Note (Signed)
 History and Physical Interval Note:  07/26/2024 10:09 AM  Tony Douglas Slight  has presented today for surgery, with the diagnosis of positive cologuard,hx: colon polyps, barretts esophagus.  The various methods of treatment have been discussed with the patient and family. After consideration of risks, benefits and other options for treatment, the patient has consented to  Procedures with comments: COLONOSCOPY (N/A) - 9:45 am, any room EGD (ESOPHAGOGASTRODUODENOSCOPY) (N/A) as a surgical intervention.  The patient's history has been reviewed, patient examined, no change in status, stable for surgery.  I have reviewed the patient's chart and labs.  Questions were answered to the patient's satisfaction.     Verle Wheeling  Note.  Diagnostic EGD and colonoscopy per plan.  Patient denies dysphagia.   The risks, benefits, limitations, imponderables and alternatives regarding both EGD and colonoscopy have been reviewed with the patient. Questions have been answered. All parties agreeable.

## 2024-07-26 NOTE — Anesthesia Postprocedure Evaluation (Signed)
"   Anesthesia Post Note  Patient: Tony Douglas  Procedure(s) Performed: COLONOSCOPY EGD (ESOPHAGOGASTRODUODENOSCOPY) ESOPHAGOSCOPY, WITH BRUSH BIOPSY  Anesthesia Type: General Anesthetic complications: no Comments: Hr still elevated post op 12 lead EKG revealed st at 108 LR bolus given which brough the hr down to the 90's Pt denies any symproms of cp/sob Advised to stay hydrated and follow up with a cardiologist to address tachycardia   No notable events documented.   Last Vitals:  Vitals:   07/26/24 1140 07/26/24 1145  BP: 125/81 133/84  Pulse: 99 97  Resp: 16 15  Temp:    SpO2: 93% 95%    Last Pain:  Vitals:   07/26/24 1058  TempSrc: Oral  PainSc: 0-No pain                 Andrea Limes      "

## 2024-07-26 NOTE — Op Note (Signed)
 [facility] Patient Name: Tony Douglas Procedure Date: 07/26/2024 10:31 AM MRN: [id number] Date of Birth: 08-31-1950 Attending MD: [name] , MD, [id number] CSN: [id number] Age: 74 Admit Type: Outpatient Procedure:                Colonoscopy Indications:              Positive Cologuard test Providers:                [name], MD, Ashley Goins, Kristine L.                            Shirlean Balm, Technician Referring MD:             [name], MD Medicines:                Propofol  per Anesthesia Complications:            No immediate complications. Estimated Blood Loss:     Estimated blood loss was minimal. Procedure:                Pre-Anesthesia Assessment:                           - Prior to the procedure, a History and Physical                            was performed, and patient medications and                            allergies were reviewed. The patient's tolerance of                            previous anesthesia was also reviewed. The risks                            and benefits of the procedure and the sedation                            options and risks were discussed with the patient.                            All questions were answered, and informed consent                            was obtained. Prior Anticoagulants: The patient has                            taken no anticoagulant or antiplatelet agents. ASA                            Grade Assessment: III - A patient with severe                            systemic disease. After reviewing the risks and  benefits, the patient was deemed in satisfactory                            condition to undergo the procedure.                           After obtaining informed consent, the colonoscope                            was passed under direct vision. Throughout the                            procedure, the patient's blood pressure, pulse, and                             oxygen  saturations were monitored continuously. The                            [id number] ([id number]) Colon was introduced through                            the anus and advanced to the the cecum, identified                            by appendiceal orifice and ileocecal valve. The                            colonoscopy was performed without difficulty. The                            patient tolerated the procedure well. The quality                            of the bowel preparation was adequate. The entire                            colon was well visualized. Scope In: 10:36:52 AM Scope Out: 10:53:55 AM Scope Withdrawal Time: 0 hours 13 minutes 4 seconds  Total Procedure Duration: 0 hours 17 minutes 3 seconds  Findings:      The perianal and digital rectal examinations were normal. Apparent       surgical anastomosis at 25 cm from the anal verge.      The retroflexed view of the distal rectum and anal verge was normal and       showed no anal or rectal abnormalities. Descending colon somewhat       reticulated in appearance. The remainder of the colonic mucosa appeared       normal. No polyp found. Descending colon was biopsied. Impression:               - The distal rectum and anal verge are normal on                            retroflexion view. Surgical anastomosis 25 cm.  Reticulated appearing colonic mucosa of uncertain                            significancestatus post biopsy                           No polyp found today; Cologuard's have a false                            positive rate of 12%. Moderate Sedation:      Moderate (conscious) sedation was personally administered by an       anesthesia professional. The following parameters were monitored: oxygen        saturation, heart rate, blood pressure, respiratory rate, EKG, adequacy       of pulmonary ventilation, and response to care. Recommendation:           - Patient has a contact  number available for                            emergencies. The signs and symptoms of potential                            delayed complications were discussed with the                            patient. Return to normal activities tomorrow.                            Written discharge instructions were provided to the                            patient.                           - Advance diet as tolerated. Follow-up on                            pathology. See EGD report.                           - Continue present medications.                           - Patient has a contact number available for                            emergencies. The signs and symptoms of potential                            delayed complications were discussed with the                            patient. Return to normal activities tomorrow.                            Written discharge instructions were provided  to the                            patient.                           - No repeat colonoscopy due to age.                           - Return to GI clinic (date not yet determined). Procedure Code(s):        --- Professional ---                           [phone], Colonoscopy, flexible; diagnostic, including                            collection of specimen(s) by brushing or washing,                            when performed (separate procedure) Diagnosis Code(s):        --- Professional ---                           R19.5, Other fecal abnormalities CPT copyright 2022 American [employer] Association. All rights reserved. The codes documented in this report are preliminary and upon coder review may  be revised to meet current compliance requirements. [name], MD [name], MD 07/26/2024 11:01:52 AM This report has been signed electronically. Number of Addenda: 0

## 2024-07-29 ENCOUNTER — Encounter (HOSPITAL_COMMUNITY): Payer: Self-pay | Admitting: Internal Medicine

## 2024-07-29 LAB — SURGICAL PATHOLOGY

## 2024-07-30 ENCOUNTER — Ambulatory Visit: Payer: Self-pay | Admitting: Internal Medicine

## 2024-07-30 MED ORDER — FLUCONAZOLE 100 MG PO TABS: ORAL_TABLET | ORAL | 0 refills | Status: AC

## 2024-07-31 NOTE — Progress Notes (Signed)
 The colonoscopy was NIC'd for 10 yrs OV NIC'd for 3 months
